# Patient Record
Sex: Male | Born: 1940 | Race: White | Hispanic: No | Marital: Married | Smoking: Former smoker
Health system: Southern US, Community
[De-identification: ages and names within clinical notes are randomized; demographics above are authoritative.]

## PROBLEM LIST (undated history)

## (undated) DIAGNOSIS — I2510 Atherosclerotic heart disease of native coronary artery without angina pectoris: Secondary | ICD-10-CM

## (undated) DIAGNOSIS — M1990 Unspecified osteoarthritis, unspecified site: Secondary | ICD-10-CM

## (undated) DIAGNOSIS — E119 Type 2 diabetes mellitus without complications: Secondary | ICD-10-CM

## (undated) DIAGNOSIS — K219 Gastro-esophageal reflux disease without esophagitis: Secondary | ICD-10-CM

## (undated) DIAGNOSIS — C44300 Unspecified malignant neoplasm of skin of unspecified part of face: Secondary | ICD-10-CM

## (undated) DIAGNOSIS — N402 Nodular prostate without lower urinary tract symptoms: Secondary | ICD-10-CM

## (undated) DIAGNOSIS — R112 Nausea with vomiting, unspecified: Secondary | ICD-10-CM

## (undated) DIAGNOSIS — Z9889 Other specified postprocedural states: Secondary | ICD-10-CM

## (undated) DIAGNOSIS — IMO0002 Reserved for concepts with insufficient information to code with codable children: Secondary | ICD-10-CM

## (undated) DIAGNOSIS — E785 Hyperlipidemia, unspecified: Secondary | ICD-10-CM

## (undated) DIAGNOSIS — N419 Inflammatory disease of prostate, unspecified: Secondary | ICD-10-CM

## (undated) DIAGNOSIS — Z87442 Personal history of urinary calculi: Secondary | ICD-10-CM

## (undated) HISTORY — PX: JOINT REPLACEMENT: SHX530

## (undated) HISTORY — DX: Atherosclerotic heart disease of native coronary artery without angina pectoris: I25.10

## (undated) HISTORY — PX: CARDIAC CATHETERIZATION: SHX172

## (undated) HISTORY — PX: CHOLECYSTECTOMY: SHX55

## (undated) HISTORY — PX: HAND SURGERY: SHX662

## (undated) HISTORY — PX: ROTATOR CUFF REPAIR: SHX139

## (undated) HISTORY — PX: KIDNEY STONE SURGERY: SHX686

## (undated) HISTORY — PX: KNEE SURGERY: SHX244

## (undated) HISTORY — DX: Inflammatory disease of prostate, unspecified: N41.9

## (undated) HISTORY — DX: Unspecified osteoarthritis, unspecified site: M19.90

## (undated) HISTORY — PX: SKIN CANCER EXCISION: SHX779

---

## 2000-07-26 ENCOUNTER — Other Ambulatory Visit: Admission: RE | Admit: 2000-07-26 | Discharge: 2000-07-26 | Payer: Self-pay | Admitting: General Surgery

## 2003-03-29 ENCOUNTER — Emergency Department (HOSPITAL_COMMUNITY): Admission: EM | Admit: 2003-03-29 | Discharge: 2003-03-29 | Payer: Self-pay | Admitting: Emergency Medicine

## 2004-03-04 ENCOUNTER — Inpatient Hospital Stay (HOSPITAL_COMMUNITY): Admission: RE | Admit: 2004-03-04 | Discharge: 2004-03-08 | Payer: Self-pay | Admitting: Orthopedic Surgery

## 2004-07-21 ENCOUNTER — Emergency Department (HOSPITAL_COMMUNITY): Admission: EM | Admit: 2004-07-21 | Discharge: 2004-07-21 | Payer: Self-pay | Admitting: Emergency Medicine

## 2004-07-25 ENCOUNTER — Observation Stay (HOSPITAL_COMMUNITY): Admission: EM | Admit: 2004-07-25 | Discharge: 2004-07-26 | Payer: Self-pay | Admitting: Emergency Medicine

## 2004-07-29 ENCOUNTER — Ambulatory Visit (HOSPITAL_COMMUNITY): Admission: RE | Admit: 2004-07-29 | Discharge: 2004-07-29 | Payer: Self-pay | Admitting: Urology

## 2007-06-07 ENCOUNTER — Ambulatory Visit (HOSPITAL_COMMUNITY): Admission: RE | Admit: 2007-06-07 | Discharge: 2007-06-07 | Payer: Self-pay | Admitting: Urology

## 2008-02-09 ENCOUNTER — Ambulatory Visit (HOSPITAL_COMMUNITY): Admission: RE | Admit: 2008-02-09 | Discharge: 2008-02-09 | Payer: Self-pay | Admitting: Internal Medicine

## 2008-02-14 ENCOUNTER — Ambulatory Visit (HOSPITAL_COMMUNITY): Admission: RE | Admit: 2008-02-14 | Discharge: 2008-02-14 | Payer: Self-pay | Admitting: Internal Medicine

## 2009-10-01 ENCOUNTER — Ambulatory Visit (HOSPITAL_COMMUNITY): Admission: RE | Admit: 2009-10-01 | Discharge: 2009-10-01 | Payer: Self-pay | Admitting: Family Medicine

## 2009-10-16 ENCOUNTER — Ambulatory Visit (HOSPITAL_COMMUNITY): Admission: RE | Admit: 2009-10-16 | Discharge: 2009-10-16 | Payer: Self-pay | Admitting: Family Medicine

## 2009-11-24 ENCOUNTER — Ambulatory Visit (HOSPITAL_COMMUNITY): Admission: RE | Admit: 2009-11-24 | Discharge: 2009-11-24 | Payer: Self-pay | Admitting: General Surgery

## 2009-12-12 ENCOUNTER — Ambulatory Visit (HOSPITAL_COMMUNITY)
Admission: RE | Admit: 2009-12-12 | Discharge: 2009-12-13 | Payer: Self-pay | Source: Home / Self Care | Admitting: General Surgery

## 2009-12-12 ENCOUNTER — Encounter (INDEPENDENT_AMBULATORY_CARE_PROVIDER_SITE_OTHER): Payer: Self-pay | Admitting: General Surgery

## 2010-01-20 ENCOUNTER — Ambulatory Visit (HOSPITAL_COMMUNITY)
Admission: RE | Admit: 2010-01-20 | Discharge: 2010-01-20 | Payer: Self-pay | Source: Home / Self Care | Attending: Family Medicine | Admitting: Family Medicine

## 2010-02-22 ENCOUNTER — Encounter: Payer: Self-pay | Admitting: Family Medicine

## 2010-04-14 LAB — CBC
HCT: 41.3 % (ref 39.0–52.0)
HCT: 45.5 % (ref 39.0–52.0)
Hemoglobin: 14.1 g/dL (ref 13.0–17.0)
MCH: 30.8 pg (ref 26.0–34.0)
MCH: 30.9 pg (ref 26.0–34.0)
MCV: 90.3 fL (ref 78.0–100.0)
MCV: 91 fL (ref 78.0–100.0)
Platelets: 148 10*3/uL — ABNORMAL LOW (ref 150–400)
Platelets: 164 10*3/uL (ref 150–400)
RBC: 4.57 MIL/uL (ref 4.22–5.81)
RDW: 13 % (ref 11.5–15.5)
WBC: 11 10*3/uL — ABNORMAL HIGH (ref 4.0–10.5)

## 2010-04-14 LAB — DIFFERENTIAL
Eosinophils Absolute: 0 10*3/uL (ref 0.0–0.7)
Eosinophils Relative: 0 % (ref 0–5)
Lymphocytes Relative: 15 % (ref 12–46)
Lymphs Abs: 1.7 10*3/uL (ref 0.7–4.0)
Monocytes Absolute: 1.2 10*3/uL — ABNORMAL HIGH (ref 0.1–1.0)
Monocytes Relative: 11 % (ref 3–12)

## 2010-04-14 LAB — HEPATIC FUNCTION PANEL
ALT: 23 U/L (ref 0–53)
ALT: 52 U/L (ref 0–53)
Alkaline Phosphatase: 43 U/L (ref 39–117)
Bilirubin, Direct: 0.2 mg/dL (ref 0.0–0.3)
Bilirubin, Direct: 0.2 mg/dL (ref 0.0–0.3)
Indirect Bilirubin: 0.6 mg/dL (ref 0.3–0.9)
Indirect Bilirubin: 0.7 mg/dL (ref 0.3–0.9)
Total Bilirubin: 0.8 mg/dL (ref 0.3–1.2)
Total Bilirubin: 0.9 mg/dL (ref 0.3–1.2)

## 2010-04-14 LAB — BASIC METABOLIC PANEL
BUN: 10 mg/dL (ref 6–23)
CO2: 25 mEq/L (ref 19–32)
CO2: 26 mEq/L (ref 19–32)
Chloride: 105 mEq/L (ref 96–112)
Chloride: 105 mEq/L (ref 96–112)
Creatinine, Ser: 0.82 mg/dL (ref 0.4–1.5)
Creatinine, Ser: 0.9 mg/dL (ref 0.4–1.5)
GFR calc Af Amer: >60 mL/min (ref >60)
Potassium: 4.1 mEq/L (ref 3.5–5.1)

## 2010-04-14 LAB — MRSA CULTURE

## 2010-06-19 NOTE — H&P (Signed)
NAME:  Stephen Rowe, Stephen Rowe                ACCOUNT NO.:  [id number]   MEDICAL RECORD NO.:  [id number]         PATIENT TYPE:  PAMB   LOCATION:                                FACILITY:  [facility]   PHYSICIAN:  Ky Barban, M.D.DATE OF BIRTH:  05/13/1940   DATE OF ADMISSION:  07/29/2004  DATE OF DISCHARGE:  LH                                HISTORY & PHYSICAL   This patient is coming on Wednesday to have a stone-basket done.   HISTORY:  A 70 year old gentleman who was seen in the office on July 23, 2004, with a right ureteral colic.  He has since last Monday been having  pain almost every day.  Pain is real severe.  He went to the emergency room  on Tuesday.  CT showed there is a 3-4 mm distal right ureteral calculus with  hydronephrosis, small bilateral renal calculi, and small bilateral  __________.  I told him that he can wait and see if the stone comes out.  If  not then Wednesday, I can do a stone-basket.  Procedures for stone-basket,  risks, complications, use of double J stent, possibility of open surgery  were discussed in detail.  They understand why I am going to go ahead and  schedule.  I see that over the weekend he had another renal colic so he was  admitted overnight in the hospital.   PAST MEDICAL HISTORY:  He had a knee replacement in February 2006.   FAMILY HISTORY:  Father had kidney stone, no prostate cancer.   SOCIAL HISTORY:  He does not smoke or drink.   REVIEW OF SYSTEMS:  Unremarkable.   PHYSICAL EXAMINATION:  VITAL SIGNS:  Blood pressure 130/80, temperature is  normal.  CENTRAL NERVOUS SYSTEM:  No neurological deficit.  HEENT:  Negative.  NECK:  Negative  CHEST:  Symmetrical.  HEART:  Regular sinus rhythm, no murmurs.  ABDOMEN:  Soft, flat.  Liver, spleen, kidneys are not palpable.  EXTERNAL GENITALIA:  Circumcised.  Meatus adequate.  Testicles are normal.  RECTAL:  Normal sphincter tone, no rectal mass, prostate ___________, there  is a hard area on the  right lobe in the middle.   IMPRESSION:  1.  Right distal ureter calculus.  2.  Bilateral small renal calculi.  3.  Hard prostate, right lobe.  4.  Cholelithiasis.  I have discussed in detail all of these findings with      the patient and they understand.       MIJ/MEDQ  D:  07/27/2004  T:  07/27/2004  Job:  [id number]
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
## 2010-06-19 NOTE — Op Note (Signed)
Stephen Rowe, Stephen Rowe               ACCOUNT NO.:  [id number]   MEDICAL RECORD NO.:  [id number]          PATIENT TYPE:  INP   LOCATION:  E454                         FACILITY:  [facility]   PHYSICIAN:  Ollen Gross, M.D.    DATE OF BIRTH:  1940-08-18   DATE OF PROCEDURE:  03/04/2004  DATE OF DISCHARGE:                                 OPERATIVE REPORT   PREOPERATIVE DIAGNOSIS:  Osteoarthritis of the right knee.   POSTOPERATIVE DIAGNOSES:  Osteoarthritis of the right knee.   OPERATION/PROCEDURE:  Right total knee arthroplasty, computer navigated.   SURGEON:  Ollen Gross, M.D.   ASSISTANT:  Alexzandrew L. Perkins, P.A.-C.   ANESTHESIA:  Spinal.   ESTIMATED BLOOD LOSS:  Minimal.   DRAINS:  Hemovac x1.   COMPLICATIONS:  None.   TOURNIQUET TIME:  77 minutes at 300 mmHg.   CONDITION:  Stable to recovery.   BRIEF CLINICAL NOTE:  Mr. Lukasik is a 70 year old male with significant end-  stage arthritis of the right knee with intractable pain.  He presents now  for right total knee arthroplasty.   DESCRIPTION OF PROCEDURE:  After the successful administration of spinal  anesthetic, a tourniquet was placed on the right thigh and right lower  extremity prepped and draped in the usual sterile fashion. The extremity was  wrapped in Esmarch, knee flexed, tourniquet inflated to 300 mmHg.   Standard midline incision was made with a 10-blade through the subcutaneous  tissue to the level of the extensor mechanism.  A fresh blade was used to  make a medial parapatellar arthrotomy and the soft tissue of proximal media  tibia subperiosteally elevated to the joint line with the knife and into the  semimembranosus bursa with a Cobb elevator.  Soft tissue of the proximal  lateral tibia was also elevated with attention being paid to avoiding the  patellar tendon on the tibial tubercle.  Patella would not evert as he has a  very large lateral osteophyte.  I decided to do the patella first as we  removed the soft tissue overlying the undersurface of the quad and patella  tendons and then measured the thickness of the patella to be 27 mm.  A free-  hand resection is then taken to 15 mm.  A 41 template was placed which would  match the appropriate size.  We did not drill the lug holes until later in  the case.  Patella is then everted.  Knee was flexed 90 degrees and the ACL  and PCL removed.  We then placed the 4 mm Shanz pins, two in the tibia and  two in the femur for placement of the computerized arrays.  The anatomical  data is then inputted into the computer to generate the anatomic femoral and  tibial models.  The alignment perioperatively is approximately 6 degrees of  varus with 5 degrees of flexion contracture.  We then subsequently began the  bony cuts.   The tibia was then subluxed forward and the menisci are removed.  We placed  the tibial cutting guide with the computerized array.  We placed it  to  remove 10 mm of the nondeficient lateral side.  Tibia resection is then made  with an oscillating saw in neutral varus and valgus and in neutral slope.  The cut is made and is verified to be as planned.  We placed the computer  verifier on there and it demonstrated the neutral cut with respect to slow,  then respective varus and valgus.  We then placed the optimizer tensioner in  full extension to gain equal flexion and extension gaps at a tension of B.  We used to B tension again in full flexion to gain the gaps.  The computer  sized the femur as a size 4 and set the rotation at approximately 5 degrees  external rotation.  This did correspond with the epicondylar axis.  We then  placed the distal femoral cutting block on the distal femur and the plan was  to resect approximately 11 mm of the femur.  Resection is made with an  oscillating saw in neutral varus and valgus and the appropriate amount of  flexion.  We confirmed this with the verification device.  The rotational   block is then placed and we put the pins at the appropriate level to give [place]  the external rotation that was marked by the computer.  We then placed the  size 4 AP cutting block and made the anterior, posterior and chamfer cuts  for the size 4 and subsequently verified that cut.  The cut was made as  planned.  The intercondylar block is placed and intercondylar cut made for  the femur.  The size 4 femoral trial was placed with excellent fit.  Tibia  was then subluxed forward again and the size 4 tibial tray is placed.  We  then prepared the proximal tibia with the modular drill and keel punch and  left the trial in place.  The 10-degree posterior stabilized rotating  platform insert was placed and full extension was achieved with excellent  varus and valgus balance throughout full range of motion. We confirmed full  extension with the computer and confirmed to overall alignment to be at 0  degrees with respect to varus and valgus.  The patella preparation was  completed using a 41 template.  The lug holes were drilled and the trial  patella was placed. The patella tracks normally.  Trials were removed and  then the osteophytes were removed off the posterior femur.  Femoral trials  were placed.  That trial was then removed and the cut bone surfaces are  prepared with pulsatile lavage.  Cement is mixed and once ready for  implantation, the size 4 mobile bearing tibial tray, size 4 posterior  stabilized femur and 41 patella are cemented into place and patella held  with a clamp.  All extruding cement is removed and once the cement is fully  hardened and the permanent 10 mm posterior stabilized rotating platform  insert is placed into the tibial tray.  The would was then copiously  irrigated with saline solution and extensor mechanism closed over a Hemovac  drain with interrupted #1 PDS.  The tourniquet released after a total time of 77 minutes.  Flexion against gravity is 135 degrees.   Subcutaneous  tissues were then closed with interrupted 2-0 Vicryl, subcuticular running 4-  0 Monocryl.  Incision was cleaned and dried and Steri-Strips and a bulky  sterile dressing applied.  The patient was then awakened and transported to  recovery in stable condition.  FA/MEDQ  D:  03/04/2004  T:  03/04/2004  Job:  [id number]
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
## 2010-07-07 ENCOUNTER — Ambulatory Visit (HOSPITAL_COMMUNITY)
Admission: RE | Admit: 2010-07-07 | Discharge: 2010-07-07 | Disposition: A | Discharge status: Home / Self Care | Payer: PRIVATE HEALTH INSURANCE | Source: Ambulatory Visit | Attending: Specialist | Admitting: Specialist

## 2010-07-07 DIAGNOSIS — M6281 Muscle weakness (generalized): Secondary | ICD-10-CM | POA: Insufficient documentation

## 2010-07-07 DIAGNOSIS — M25619 Stiffness of unspecified shoulder, not elsewhere classified: Secondary | ICD-10-CM | POA: Insufficient documentation

## 2010-07-07 DIAGNOSIS — M25519 Pain in unspecified shoulder: Secondary | ICD-10-CM | POA: Insufficient documentation

## 2010-07-07 DIAGNOSIS — IMO0001 Reserved for inherently not codable concepts without codable children: Secondary | ICD-10-CM | POA: Insufficient documentation

## 2010-07-08 ENCOUNTER — Ambulatory Visit (HOSPITAL_COMMUNITY)
Admission: RE | Admit: 2010-07-08 | Discharge: 2010-07-08 | Disposition: A | Discharge status: Home / Self Care | Payer: PRIVATE HEALTH INSURANCE | Source: Ambulatory Visit | Attending: *Deleted | Admitting: *Deleted

## 2010-07-09 ENCOUNTER — Ambulatory Visit (HOSPITAL_COMMUNITY)
Admission: RE | Admit: 2010-07-09 | Discharge: 2010-07-09 | Disposition: A | Discharge status: Home / Self Care | Payer: PRIVATE HEALTH INSURANCE | Source: Ambulatory Visit | Attending: *Deleted | Admitting: *Deleted

## 2010-07-13 ENCOUNTER — Ambulatory Visit (HOSPITAL_COMMUNITY): Payer: PRIVATE HEALTH INSURANCE | Admitting: Specialist

## 2010-07-14 ENCOUNTER — Ambulatory Visit (HOSPITAL_COMMUNITY)
Admission: RE | Admit: 2010-07-14 | Discharge: 2010-07-14 | Disposition: A | Discharge status: Home / Self Care | Payer: PRIVATE HEALTH INSURANCE | Source: Ambulatory Visit | Attending: Specialist | Admitting: Specialist

## 2010-07-16 ENCOUNTER — Ambulatory Visit (HOSPITAL_COMMUNITY)
Admission: RE | Admit: 2010-07-16 | Discharge: 2010-07-16 | Disposition: A | Discharge status: Home / Self Care | Payer: PRIVATE HEALTH INSURANCE | Source: Ambulatory Visit | Attending: *Deleted | Admitting: *Deleted

## 2010-07-21 ENCOUNTER — Ambulatory Visit (HOSPITAL_COMMUNITY)
Admission: RE | Admit: 2010-07-21 | Discharge: 2010-07-21 | Disposition: A | Discharge status: Home / Self Care | Payer: PRIVATE HEALTH INSURANCE | Source: Ambulatory Visit | Attending: *Deleted | Admitting: *Deleted

## 2010-07-22 ENCOUNTER — Ambulatory Visit (HOSPITAL_COMMUNITY)
Admission: RE | Admit: 2010-07-22 | Discharge: 2010-07-22 | Disposition: A | Discharge status: Home / Self Care | Payer: PRIVATE HEALTH INSURANCE | Source: Ambulatory Visit | Attending: *Deleted | Admitting: *Deleted

## 2010-07-23 ENCOUNTER — Ambulatory Visit (HOSPITAL_COMMUNITY)
Admission: RE | Admit: 2010-07-23 | Discharge: 2010-07-23 | Disposition: A | Discharge status: Home / Self Care | Payer: PRIVATE HEALTH INSURANCE | Source: Ambulatory Visit | Attending: *Deleted | Admitting: *Deleted

## 2010-07-27 ENCOUNTER — Ambulatory Visit (HOSPITAL_COMMUNITY)
Admission: RE | Admit: 2010-07-27 | Discharge: 2010-07-27 | Disposition: A | Discharge status: Home / Self Care | Payer: PRIVATE HEALTH INSURANCE | Source: Ambulatory Visit | Attending: *Deleted | Admitting: *Deleted

## 2010-07-29 ENCOUNTER — Ambulatory Visit (HOSPITAL_COMMUNITY)
Admission: RE | Admit: 2010-07-29 | Discharge: 2010-07-29 | Disposition: A | Discharge status: Home / Self Care | Payer: PRIVATE HEALTH INSURANCE | Source: Ambulatory Visit | Attending: *Deleted | Admitting: *Deleted

## 2010-07-31 ENCOUNTER — Ambulatory Visit (HOSPITAL_COMMUNITY)
Admission: RE | Admit: 2010-07-31 | Discharge: 2010-07-31 | Disposition: A | Discharge status: Home / Self Care | Payer: PRIVATE HEALTH INSURANCE | Source: Ambulatory Visit | Attending: *Deleted | Admitting: *Deleted

## 2010-08-03 ENCOUNTER — Ambulatory Visit (HOSPITAL_COMMUNITY)
Admission: RE | Admit: 2010-08-03 | Discharge: 2010-08-03 | Disposition: A | Discharge status: Home / Self Care | Payer: PRIVATE HEALTH INSURANCE | Source: Ambulatory Visit | Attending: Specialist | Admitting: Specialist

## 2010-08-03 DIAGNOSIS — M25619 Stiffness of unspecified shoulder, not elsewhere classified: Secondary | ICD-10-CM | POA: Insufficient documentation

## 2010-08-03 DIAGNOSIS — M6281 Muscle weakness (generalized): Secondary | ICD-10-CM | POA: Insufficient documentation

## 2010-08-03 DIAGNOSIS — M25519 Pain in unspecified shoulder: Secondary | ICD-10-CM | POA: Insufficient documentation

## 2010-08-03 DIAGNOSIS — IMO0001 Reserved for inherently not codable concepts without codable children: Secondary | ICD-10-CM | POA: Insufficient documentation

## 2010-08-06 ENCOUNTER — Ambulatory Visit (HOSPITAL_COMMUNITY)
Admission: RE | Admit: 2010-08-06 | Discharge: 2010-08-06 | Disposition: A | Discharge status: Home / Self Care | Payer: PRIVATE HEALTH INSURANCE | Source: Ambulatory Visit | Attending: *Deleted | Admitting: *Deleted

## 2010-08-07 ENCOUNTER — Ambulatory Visit (HOSPITAL_COMMUNITY): Payer: PRIVATE HEALTH INSURANCE | Admitting: Specialist

## 2010-08-10 ENCOUNTER — Ambulatory Visit (HOSPITAL_COMMUNITY)
Admission: RE | Admit: 2010-08-10 | Discharge: 2010-08-10 | Disposition: A | Discharge status: Home / Self Care | Payer: PRIVATE HEALTH INSURANCE | Source: Ambulatory Visit | Attending: *Deleted | Admitting: *Deleted

## 2010-08-10 NOTE — Progress Notes (Signed)
Occupational Therapy Treatment  Patient Name: Stephen Rowe MRN: [id number] Today's Date: 08/10/2010  HPI: Symptoms/Limitations Symptoms: increased pain Limitations: decreased mobility and strength Special Tests: n/a Pain Assessment Currently in Pain?: Yes Pain Score:   1 Pain Location: Shoulder Pain Orientation: Right Pain Type: Acute pain Pain Radiating Towards: n/a Pain Onset: In the past 7 days Pain Frequency: Intermittent Pain Relieving Factors: ice Effect of Pain on Daily Activities: just sore  Precautions/Restrictions    Mobility       Exercise/Treatments @FLOW [phone]  Goals Long Term Goals Long Term Goal 1: Patient will decrease pain in his right shoulder to 0/10. Long Term Goal 1 Progress: Progressing toward goal Long Term Goal 2: Increase right shoulder AROM to  [facility] for increased independence reaching into cabinets. Long Term Goal 2 Progress: Progressing toward goal Long Term Goal 3: Patient will return to prior level of independence with all daily and leisure activities. Long Term Goal 3 Progress: Progressing toward goal Long Term Goal 4: Patient will decrease fascial restrictions to trace in his right shoulder. Long Term Goal 4 Progress: Progressing toward goal Long Term Goal 5: Patient will increase right shoulder strength to 5/5 for increased independence with yardwork. Long Term Goal 5 Progress: Progressing toward goal End of Session There is no problem list on file for this patient.  End of Session Activity Tolerance: Patient tolerated treatment well General Behavior During Session: [facility] for tasks performed Cognition: [facility]  for tasks performed OT Assessment and Plan Clinical Impression Statement: added cybex exercises, increased to 2 pounds with PRE, added right left with ball exercise. Prognosis: Good OT Frequency: Min 3X/week OT Duration: Other (comment) (12 weeks) OT Treatment/Interventions: Therapeutic exercise;Manual therapy OT Plan: increase reps with strengthening exercises and increase time on wall wash   Stephen Rowe L 08/10/2010, 10:06 AM

## 2010-08-12 ENCOUNTER — Ambulatory Visit (HOSPITAL_COMMUNITY)
Admission: RE | Admit: 2010-08-12 | Discharge: 2010-08-12 | Disposition: A | Discharge status: Home / Self Care | Payer: PRIVATE HEALTH INSURANCE | Source: Ambulatory Visit | Attending: *Deleted | Admitting: *Deleted

## 2010-08-12 DIAGNOSIS — M25519 Pain in unspecified shoulder: Secondary | ICD-10-CM | POA: Insufficient documentation

## 2010-08-12 DIAGNOSIS — M75120 Complete rotator cuff tear or rupture of unspecified shoulder, not specified as traumatic: Secondary | ICD-10-CM | POA: Insufficient documentation

## 2010-08-12 DIAGNOSIS — M6281 Muscle weakness (generalized): Secondary | ICD-10-CM | POA: Insufficient documentation

## 2010-08-12 NOTE — Progress Notes (Signed)
Occupational Therapy Treatment  Patient Name: Stephen Rowe MRN: [id number] Today's Date: 08/12/2010  HPI: Symptoms/Limitations Symptoms:  little discomfort but not hurting Limitations: unable to sleep on that side causes increased pain Pain Assessment Currently in Pain?: No/denies Pain Score: 0-No pain  Precautions/Restrictions    Mobility       Exercise/Treatments @FLOW [phone]  Goals   End of Session Patient Active Problem List  Diagnoses  . Pain in joint, shoulder region  . Complete rupture of rotator cuff  . Muscle weakness (generalized)   End of Session Activity Tolerance: Patient tolerated treatment well OT Assessment and Plan Clinical Impression Statement: increased reps with weighted and  band exercises and increased plates with cybex Prognosis: Good OT Plan: add t-band to HEP   Katrina Brosh L 08/12/2010, 10:06 AM

## 2010-08-13 ENCOUNTER — Ambulatory Visit (HOSPITAL_COMMUNITY)
Admission: RE | Admit: 2010-08-13 | Discharge: 2010-08-13 | Disposition: A | Discharge status: Home / Self Care | Payer: PRIVATE HEALTH INSURANCE | Source: Ambulatory Visit | Attending: Family Medicine | Admitting: Family Medicine

## 2010-08-13 NOTE — Progress Notes (Deleted)
Occupational Therapy Treatment  Patient Name: Stephen Rowe MRN: [id number] Today's Date: 08/13/2010      Time in:  8:37  Time out:  9:40   HPI: Symptoms/Limitations Symptoms: It hurts durning the night.  I had to get up and put ice one it. Pain Assessment Currently in Pain?: Yes Pain Score:   1 Pain Location: Shoulder Pain Orientation: Right Pain Type: Surgical pain Pain Frequency: Intermittent  Precautions/Restrictions    Mobility       Exercise/Treatments Cervical Exercises Shoulder Flexion: PROM;Supine;Strengthening;15 reps (2 # supine and seated) Therapy Ball (Shoulder Flexion): 25 Theraband Level (Shoulder Extension): Level 3 (Green) (12 reps) Shoulder ABduction: PROM;Right;Supine;Strengthening;15 reps (2# supine and seated) Therapy Ball (Shoulder Abduction): 25 right and left 10 reps each Shoulder Horizontal ABduction: PROM;Right;Supine;Strengthening;15 reps (2# supine and seated) Theraband Level (Shoulder Retraction): Level 3 (Green) (12 reps) Theraband Level (Row): Level 3 (Green) (12 reps) Shoulder Internal Rotation: PROM;Right;Supine;Strengthening;15 reps (2# supine and seated) Theraband Level (Shoulder Internal Rotation): Level 3 (Green) (12 reps) Shoulder External Rotation: PROM;Right;Supine;Strengthening;15 reps (2# supine and seated) Theraband Level (Shoulder External Rotation): Level 3 (Green) (12 reps) UBE (Upper Arm Bike): 3' forward 3' reverse 2.0 resistance Shoulder Exercises Shoulder Flexion: PROM;Supine;Strengthening;15 reps (2 # supine and seated) Therapy Ball (Shoulder Flexion): 25 Theraband Level (Shoulder Extension): Level 3 (Green) (12 reps) Theraband Level (Shoulder Retraction): Level 3 (Green) (12 reps) Shoulder Protraction: Strengthening;Right;15 reps;Supine;Seated (2# supine and seated) Shoulder Horizontal ABduction: PROM;Right;Supine;Strengthening;15 reps (2# supine and seated) Shoulder ABduction: PROM;Right;Supine;Strengthening;15 reps (2#  supine and seated) Therapy Ball (Shoulder Abduction): 25 right and left 10 reps each Shoulder External Rotation: PROM;Right;Supine;Strengthening;15 reps (2# supine and seated) Theraband Level (Shoulder External Rotation): Level 3 (Green) (12 reps) Shoulder Internal Rotation: PROM;Right;Supine;Strengthening;15 reps (2# supine and seated) Theraband Level (Shoulder Internal Rotation): Level 3 (Green) (12 reps) Theraband Level (Row): Level 3 (Green) (12 reps) Additional ROM/Strengthening Exercises UBE (Upper Arm Bike): 3' forward 3' reverse 2.0 resistance Cybex Press: 2 plates x 15 reps Cybex Row: 2 plates x 15 reps Wall Wash: 4 minutes with 1 pound Thumb Tacks: 2 minutes Elevation/Depression - Shoulder Pro/Retraction: 2 minutes Additional Elbow Exercises UBE (Upper Arm Bike): 3' forward 3' reverse 2.0 resistance Cybex Press: 2 plates x 15 reps Cybex Row: 2 plates x 15 reps Neurological Re-education Exercises Shoulder Flexion: PROM;Supine;Strengthening;15 reps (2 # supine and seated) Therapy Ball (Shoulder Flexion): 25 Shoulder ABduction: PROM;Right;Supine;Strengthening;15 reps (2# supine and seated) Therapy Ball (Shoulder Abduction): 25 right and left 10 reps each Shoulder Protraction: Strengthening;Right;15 reps;Supine;Seated (2# supine and seated) Shoulder Horizontal ABduction: PROM;Right;Supine;Strengthening;15 reps (2# supine and seated) Shoulder External Rotation: PROM;Right;Supine;Strengthening;15 reps (2# supine and seated) Theraband Level (Shoulder External Rotation): Level 3 (Green) (12 reps) Shoulder Internal Rotation: PROM;Right;Supine;Strengthening;15 reps (2# supine and seated) Theraband Level (Shoulder Internal Rotation): Level 3 (Green) (12 reps) Work Hardening Exercises UBE (Upper Arm Bike): 3' forward 3' reverse 2.0 resistance Manual Therapy Manual Therapy: Myofascial release Myofascial Release: 8:37 to 8:54   Goals Long Term Goals Long Term Goal 1 Progress:  Progressing toward goal Long Term Goal 2 Progress: Met Long Term Goal 3 Progress: Progressing toward goal Long Term Goal 4 Progress: Partly met Long Term Goal 5 Progress: Progressing toward goal End of Session Patient Active Problem List  Diagnoses  . Pain in joint, shoulder region  . Complete rupture of rotator cuff  . Muscle weakness (generalized)       Noralee Stain, Kiearra Oyervides L 08/13/2010, 9:56 AM  Visit 15 of 36

## 2010-08-13 NOTE — Patient Instructions (Signed)
Patient instructed on t-ban for HEP.  Pt given handout for retraction, extension, row and IR/ER with green band.

## 2010-08-13 NOTE — Progress Notes (Signed)
Occupational Therapy Treatment  Patient Name: TELVIN REINDERS MRN: [id number] Today's Date: 08/13/2010     Time in:  8:37  Time out:  9:40  HPI: Symptoms/Limitations Symptoms: It hurts durning the night.  I had to get up and put ice one it. Pain Assessment Currently in Pain?: Yes Pain Score:   1 Pain Location: Shoulder Pain Orientation: Right Pain Type: Surgical pain Pain Frequency: Intermittent  Precautions/Restrictions    Mobility       Exercise/Treatments Cervical Exercises Shoulder Flexion: PROM;Supine;Strengthening;15 reps (2 # supine and seated) Therapy Ball (Shoulder Flexion): 25 Theraband Level (Shoulder Extension): Level 3 (Green) (12 reps) Shoulder ABduction: PROM;Right;Supine;Strengthening;15 reps (2# supine and seated) Therapy Ball (Shoulder Abduction): 25 right and left 10 reps each Shoulder Horizontal ABduction: PROM;Right;Supine;Strengthening;15 reps (2# supine and seated) Theraband Level (Shoulder Retraction): Level 3 (Green) (12 reps) Theraband Level (Row): Level 3 (Green) (12 reps) Shoulder Internal Rotation: PROM;Right;Supine;Strengthening;15 reps (2# supine and seated) Theraband Level (Shoulder Internal Rotation): Level 3 (Green) (12 reps) Shoulder External Rotation: PROM;Right;Supine;Strengthening;15 reps (2# supine and seated) Theraband Level (Shoulder External Rotation): Level 3 (Green) (12 reps) UBE (Upper Arm Bike): 3' forward 3' reverse 2.0 resistance Shoulder Exercises Shoulder Flexion: PROM;Supine;Strengthening;15 reps (2 # supine and seated) Therapy Ball (Shoulder Flexion): 25 Theraband Level (Shoulder Extension): Level 3 (Green) (12 reps) Theraband Level (Shoulder Retraction): Level 3 (Green) (12 reps) Shoulder Protraction: Strengthening;Right;15 reps;Supine;Seated (2# supine and seated) Shoulder Horizontal ABduction: PROM;Right;Supine;Strengthening;15 reps (2# supine and seated) Shoulder ABduction: PROM;Right;Supine;Strengthening;15 reps (2#  supine and seated) Therapy Ball (Shoulder Abduction): 25 right and left 10 reps each Shoulder External Rotation: PROM;Right;Supine;Strengthening;15 reps (2# supine and seated) Theraband Level (Shoulder External Rotation): Level 3 (Green) (12 reps) Shoulder Internal Rotation: PROM;Right;Supine;Strengthening;15 reps (2# supine and seated) Theraband Level (Shoulder Internal Rotation): Level 3 (Green) (12 reps) Theraband Level (Row): Level 3 (Green) (12 reps) Additional ROM/Strengthening Exercises UBE (Upper Arm Bike): 3' forward 3' reverse 2.0 resistance Cybex Press: 2 plates x 15 reps Cybex Row: 2 plates x 15 reps Wall Wash: 4 minutes with 1 pound Thumb Tacks: 2 minutes Elevation/Depression - Shoulder Pro/Retraction: 2 minutes Additional Elbow Exercises UBE (Upper Arm Bike): 3' forward 3' reverse 2.0 resistance Cybex Press: 2 plates x 15 reps Cybex Row: 2 plates x 15 reps Neurological Re-education Exercises Shoulder Flexion: PROM;Supine;Strengthening;15 reps (2 # supine and seated) Therapy Ball (Shoulder Flexion): 25 Shoulder ABduction: PROM;Right;Supine;Strengthening;15 reps (2# supine and seated) Therapy Ball (Shoulder Abduction): 25 right and left 10 reps each Shoulder Protraction: Strengthening;Right;15 reps;Supine;Seated (2# supine and seated) Shoulder Horizontal ABduction: PROM;Right;Supine;Strengthening;15 reps (2# supine and seated) Shoulder External Rotation: PROM;Right;Supine;Strengthening;15 reps (2# supine and seated) Theraband Level (Shoulder External Rotation): Level 3 (Green) (12 reps) Shoulder Internal Rotation: PROM;Right;Supine;Strengthening;15 reps (2# supine and seated) Theraband Level (Shoulder Internal Rotation): Level 3 (Green) (12 reps) Work Hardening Exercises UBE (Upper Arm Bike): 3' forward 3' reverse 2.0 resistance Manual Therapy Manual Therapy: Myofascial release Myofascial Release: 8:37 to 8:54   Goals Long Term Goals Long Term Goal 1 Progress:  Progressing toward goal Long Term Goal 2 Progress: Met Long Term Goal 3 Progress: Progressing toward goal Long Term Goal 4 Progress: Partly met Long Term Goal 5 Progress: Progressing toward goal End of Session Patient Active Problem List  Diagnoses  . Pain in joint, shoulder region  . Complete rupture of rotator cuff  . Muscle weakness (generalized)   OT Assessment and Plan Clinical Impression Statement: added tband to hep Prognosis: Good OT Plan: d/c band   Noralee Stain, Ilse Billman  L 08/13/2010, 9:59 AM  Visit 16 of 36

## 2011-08-26 ENCOUNTER — Ambulatory Visit (INDEPENDENT_AMBULATORY_CARE_PROVIDER_SITE_OTHER): Payer: PRIVATE HEALTH INSURANCE | Admitting: Otolaryngology

## 2011-08-26 DIAGNOSIS — H902 Conductive hearing loss, unspecified: Secondary | ICD-10-CM

## 2011-08-26 DIAGNOSIS — H6120 Impacted cerumen, unspecified ear: Secondary | ICD-10-CM

## 2011-10-19 ENCOUNTER — Other Ambulatory Visit (HOSPITAL_COMMUNITY): Payer: Self-pay | Admitting: Internal Medicine

## 2011-10-19 DIAGNOSIS — Z0000 Encounter for general adult medical examination without abnormal findings: Secondary | ICD-10-CM

## 2011-10-25 ENCOUNTER — Ambulatory Visit (HOSPITAL_COMMUNITY)
Admission: RE | Admit: 2011-10-25 | Discharge: 2011-10-25 | Disposition: A | Discharge status: Home / Self Care | Payer: Medicare Other | Source: Ambulatory Visit | Attending: Internal Medicine | Admitting: Internal Medicine

## 2011-10-25 ENCOUNTER — Other Ambulatory Visit (HOSPITAL_COMMUNITY): Payer: Self-pay | Admitting: Internal Medicine

## 2011-10-25 DIAGNOSIS — I2510 Atherosclerotic heart disease of native coronary artery without angina pectoris: Secondary | ICD-10-CM | POA: Insufficient documentation

## 2011-10-25 DIAGNOSIS — Z0000 Encounter for general adult medical examination without abnormal findings: Secondary | ICD-10-CM

## 2011-10-25 DIAGNOSIS — I779 Disorder of arteries and arterioles, unspecified: Secondary | ICD-10-CM | POA: Insufficient documentation

## 2011-10-26 ENCOUNTER — Ambulatory Visit (INDEPENDENT_AMBULATORY_CARE_PROVIDER_SITE_OTHER): Payer: Medicare Other | Admitting: Gastroenterology

## 2011-10-26 ENCOUNTER — Encounter: Payer: Self-pay | Admitting: Gastroenterology

## 2011-10-26 VITALS — BP 131/74 | HR 88 | Temp 98.2°F | Ht 70.0 in | Wt 203.8 lb

## 2011-10-26 DIAGNOSIS — K219 Gastro-esophageal reflux disease without esophagitis: Secondary | ICD-10-CM

## 2011-10-26 DIAGNOSIS — Z1211 Encounter for screening for malignant neoplasm of colon: Secondary | ICD-10-CM

## 2011-10-26 DIAGNOSIS — R1031 Right lower quadrant pain: Secondary | ICD-10-CM

## 2011-10-26 DIAGNOSIS — IMO0001 Reserved for inherently not codable concepts without codable children: Secondary | ICD-10-CM | POA: Insufficient documentation

## 2011-10-26 MED ORDER — PEG 3350-KCL-NA BICARB-NACL 420 G PO SOLR: 4000.0000 mL | ORAL | Status: DC

## 2011-10-26 NOTE — Assessment & Plan Note (Signed)
71 year old male with need for initial screening colonoscopy. No rectal bleeding, rare constipation related to dietary choices. Notes RLQ "awareness", but no pain. No abnormalities appreciated on physical exam. See "RLQ discomfort".   Proceed with TCS with Dr. Jena Gauss in near future: the risks, benefits, and alternatives have been discussed with the patient in detail. The patient states understanding and desires to proceed.

## 2011-10-26 NOTE — Progress Notes (Signed)
Faxed to PCP

## 2011-10-26 NOTE — Assessment & Plan Note (Signed)
Rare, related to dietary choices. No PPI currently. Notes extremely rare episodes of difficulty swallowing chicken and meat if not chewing well enough. Instructed to chew well, monitor for any continued or worsening of symptoms. No need for upper endoscopy at this point but would offer in future if no improvement with behavior modification.

## 2011-10-26 NOTE — Progress Notes (Signed)
Primary Care Physician:  Cassell Smiles., MD Primary Gastroenterologist:  Dr. Jena Gauss   Chief Complaint  Patient presents with  . Colonoscopy    HPI:   71 year old male who presents today for visit prior to initial screening colonoscopy. States he is somewhat overdue, as he was scheduled to have one in the past but developed kidney stones. Notes very seldom constipation. Ate junk food over the weekend in [place] at the [employer]. Otherwise no change in bowel habits. Denies rectal bleeding. Notes RLQ "awareness" since cholecystectomy in Oct 2012. NO PAIN. States his urologist checked him for inguinal hernia. Pulls knee up and "feels like there is something in there".  Unaware of any straining, strenuous activity that would cause it. Noticing increasing since early spring.   If eats wrong food, "a little acid" and will take an antacid tablet. Immediate relief. Sweets aggravate. Also states chicken and meat will sometimes cause an issue if he doesn't chew well or eats too fast. States extremely rare.   Past Medical History  Diagnosis Date  . CAD (coronary artery disease)     3 blockages, unchanged in past 15 years, s/p cardiac cath X 2 but no stents  . Arthritis   . Prostatitis     Past Surgical History  Procedure Date  . Kidney stone surgery   . Rotator cuff repair     right  . Cholecystectomy   . Knee surgery     right  . Hand surgery     right    Current Outpatient Prescriptions  Medication Sig Dispense Refill  . aspirin 81 MG tablet Take 81 mg by mouth daily.      . rosuvastatin (CRESTOR) 10 MG tablet Take 10 mg by mouth daily.        Allergies as of 10/26/2011  . (No Known Allergies)    Family History  Problem Relation Age of Onset  . Colon cancer Neg Hx     History   Social History  . Marital Status: Married    Spouse Name: N/A    Number of Children: N/A  . Years of Education: N/A   Occupational History  . retired     ran a Manufacturing engineer   Social History  Main Topics  . Smoking status: Former Smoker -- 0.5 packs/day    Types: Cigarettes  . Smokeless tobacco: Not on file   Comment: quit about 45 years ago  . Alcohol Use: No  . Drug Use: No  . Sexually Active: Not on file   Other Topics Concern  . Not on file   Social History Narrative  . No narrative on file    Review of Systems: Gen: Denies any fever, chills, fatigue, weight loss, lack of appetite.  CV: Denies chest pain, heart palpitations, peripheral edema, syncope.  Resp: Denies shortness of breath at rest or with exertion. Denies wheezing or cough.  GI: SEE HPI GU : Denies urinary burning, urinary frequency, urinary hesitancy MS: Denies joint pain, muscle weakness, cramps, or limitation of movement.  Derm: Denies rash, itching, dry skin Psych: Denies depression, anxiety, memory loss, and confusion Heme: Denies bruising, bleeding, and enlarged lymph nodes.  Physical Exam: BP 131/74  Pulse 88  Temp 98.2 F (36.8 C) (Temporal)  Ht 5\' 10"  (1.778 m)  Wt 203 lb 12.8 oz (92.443 kg)  BMI 29.24 kg/m2 General:   Alert and oriented. Pleasant and cooperative. Well-nourished and well-developed.  Head:  Normocephalic and atraumatic. Eyes:  Without icterus, sclera clear and  conjunctiva pink.  Ears:  Normal auditory acuity. Nose:  No deformity, discharge,  or lesions. Mouth:  No deformity or lesions, oral mucosa pink.  Neck:  Supple, without mass or thyromegaly. Lungs:  Clear to auscultation bilaterally. No wheezes, rales, or rhonchi. No distress.  Heart:  S1, S2 present without murmurs appreciated.  Abdomen:  +BS, soft, non-tender and non-distended. No HSM noted. No guarding or rebound. No masses appreciated.  Rectal:  Deferred  Msk:  Symmetrical without gross deformities. Normal posture. Extremities:  Without clubbing or edema. Neurologic:  Alert and  oriented x4;  grossly normal neurologically. Skin:  Intact without significant lesions or rashes. Cervical Nodes:  No  significant cervical adenopathy. Psych:  Alert and cooperative. Normal mood and affect.

## 2011-10-26 NOTE — Patient Instructions (Addendum)
We have set you up for a colonoscopy with Dr. Rourk in the near future.  Further recommendations to follow once this is completed.  

## 2011-10-26 NOTE — Assessment & Plan Note (Signed)
Notes "awareness" in RLQ with movement, exertion, and being still. No pain. No abnormalities on physical exam. Unclear etiology at this time. Consider CT scan if necessary after TCS. Last scan in Aug 2011.

## 2011-11-15 ENCOUNTER — Telehealth: Payer: Self-pay | Admitting: Internal Medicine

## 2011-11-15 ENCOUNTER — Encounter (HOSPITAL_COMMUNITY): Payer: Self-pay | Admitting: Pharmacy Technician

## 2011-11-15 NOTE — Telephone Encounter (Signed)
Please call patient back at [phone]. He has questions concerning his procedure

## 2011-11-15 NOTE — Telephone Encounter (Signed)
Done

## 2011-11-17 MED ORDER — SODIUM CHLORIDE 0.45 % IV SOLN
INTRAVENOUS | Status: DC
  Administered 2011-11-18: 08:00:00 via INTRAVENOUS

## 2011-11-18 ENCOUNTER — Ambulatory Visit (HOSPITAL_COMMUNITY)
Admission: RE | Admit: 2011-11-18 | Discharge: 2011-11-18 | Disposition: A | Discharge status: Home / Self Care | Payer: Medicare Other | Source: Ambulatory Visit | Attending: Internal Medicine | Admitting: Internal Medicine

## 2011-11-18 ENCOUNTER — Encounter (HOSPITAL_COMMUNITY)
Admission: RE | Disposition: A | Discharge status: Home / Self Care | Payer: Self-pay | Source: Ambulatory Visit | Attending: Internal Medicine

## 2011-11-18 ENCOUNTER — Encounter (HOSPITAL_COMMUNITY): Payer: Self-pay | Admitting: *Deleted

## 2011-11-18 DIAGNOSIS — Z1211 Encounter for screening for malignant neoplasm of colon: Secondary | ICD-10-CM

## 2011-11-18 DIAGNOSIS — K5730 Diverticulosis of large intestine without perforation or abscess without bleeding: Secondary | ICD-10-CM

## 2011-11-18 HISTORY — DX: Hyperlipidemia, unspecified: E78.5

## 2011-11-18 HISTORY — DX: Other specified postprocedural states: Z98.890

## 2011-11-18 HISTORY — DX: Unspecified malignant neoplasm of skin of unspecified part of face: C44.300

## 2011-11-18 HISTORY — PX: COLONOSCOPY: SHX5424

## 2011-11-18 HISTORY — DX: Other specified postprocedural states: R11.2

## 2011-11-18 HISTORY — DX: Personal history of urinary calculi: Z87.442

## 2011-11-18 HISTORY — DX: Gastro-esophageal reflux disease without esophagitis: K21.9

## 2011-11-18 SURGERY — COLONOSCOPY
Anesthesia: Moderate Sedation

## 2011-11-18 MED ORDER — MEPERIDINE HCL 100 MG/ML IJ SOLN
INTRAMUSCULAR | Status: DC | PRN
  Administered 2011-11-18: 25 mg via INTRAVENOUS
  Administered 2011-11-18: 50 mg via INTRAVENOUS

## 2011-11-18 MED ORDER — STERILE WATER FOR IRRIGATION IR SOLN
Status: DC | PRN
  Administered 2011-11-18: 09:00:00

## 2011-11-18 MED ORDER — ONDANSETRON HCL 4 MG/2ML IJ SOLN
INTRAMUSCULAR | Status: DC | PRN
  Administered 2011-11-18: 4 mg via INTRAVENOUS

## 2011-11-18 MED ORDER — MIDAZOLAM HCL 5 MG/5ML IJ SOLN
INTRAMUSCULAR | Status: DC | PRN
  Administered 2011-11-18: 1 mg via INTRAVENOUS
  Administered 2011-11-18 (×2): 2 mg via INTRAVENOUS

## 2011-11-18 MED ORDER — MIDAZOLAM HCL 5 MG/5ML IJ SOLN
INTRAMUSCULAR | Status: AC
  Filled 2011-11-18: qty 5

## 2011-11-18 MED ORDER — MIDAZOLAM HCL 5 MG/5ML IJ SOLN
INTRAMUSCULAR | Status: AC
  Filled 2011-11-18: qty 10

## 2011-11-18 MED ORDER — MEPERIDINE HCL 100 MG/ML IJ SOLN
INTRAMUSCULAR | Status: AC
  Filled 2011-11-18: qty 2

## 2011-11-18 NOTE — Interval H&P Note (Signed)
History and Physical Interval Note:  11/18/2011 8:42 AM  Stephen Rowe  has presented today for surgery, with the diagnosis of SCREENING  The various methods of treatment have been discussed with the patient and family. After consideration of risks, benefits and other options for treatment, the patient has consented to  Procedure(s) (LRB) with comments: COLONOSCOPY (N/A) - 8:45 as a surgical intervention .  The patient's history has been reviewed, patient examined, no change in status, stable for surgery.  I have reviewed the patient's chart and labs.  Questions were answered to the patient's satisfaction.     Eula Listen

## 2011-11-18 NOTE — H&P (View-Only) (Signed)
Primary Care Physician:  FUSCO,LAWRENCE J., MD Primary Gastroenterologist:  Dr. Rourk   Chief Complaint  Patient presents with  . Colonoscopy    HPI:   70-year-old male who presents today for visit prior to initial screening colonoscopy. States he is somewhat overdue, as he was scheduled to have one in the past but developed kidney stones. Notes very seldom constipation. Ate junk food over the weekend in [place] at the [employer]. Otherwise no change in bowel habits. Denies rectal bleeding. Notes RLQ "awareness" since cholecystectomy in Oct 2012. NO PAIN. States his urologist checked him for inguinal hernia. Pulls knee up and "feels like there is something in there".  Unaware of any straining, strenuous activity that would cause it. Noticing increasing since early spring.   If eats wrong food, "a little acid" and will take an antacid tablet. Immediate relief. Sweets aggravate. Also states chicken and meat will sometimes cause an issue if he doesn't chew well or eats too fast. States extremely rare.   Past Medical History  Diagnosis Date  . CAD (coronary artery disease)     3 blockages, unchanged in past 15 years, s/p cardiac cath X 2 but no stents  . Arthritis   . Prostatitis     Past Surgical History  Procedure Date  . Kidney stone surgery   . Rotator cuff repair     right  . Cholecystectomy   . Knee surgery     right  . Hand surgery     right    Current Outpatient Prescriptions  Medication Sig Dispense Refill  . aspirin 81 MG tablet Take 81 mg by mouth daily.      . rosuvastatin (CRESTOR) 10 MG tablet Take 10 mg by mouth daily.        Allergies as of 10/26/2011  . (No Known Allergies)    Family History  Problem Relation Age of Onset  . Colon cancer Neg Hx     History   Social History  . Marital Status: Married    Spouse Name: N/A    Number of Children: N/A  . Years of Education: N/A   Occupational History  . retired     ran a sign shop   Social History  Main Topics  . Smoking status: Former Smoker -- 0.5 packs/day    Types: Cigarettes  . Smokeless tobacco: Not on file   Comment: quit about 45 years ago  . Alcohol Use: No  . Drug Use: No  . Sexually Active: Not on file   Other Topics Concern  . Not on file   Social History Narrative  . No narrative on file    Review of Systems: Gen: Denies any fever, chills, fatigue, weight loss, lack of appetite.  CV: Denies chest pain, heart palpitations, peripheral edema, syncope.  Resp: Denies shortness of breath at rest or with exertion. Denies wheezing or cough.  GI: SEE HPI GU : Denies urinary burning, urinary frequency, urinary hesitancy MS: Denies joint pain, muscle weakness, cramps, or limitation of movement.  Derm: Denies rash, itching, dry skin Psych: Denies depression, anxiety, memory loss, and confusion Heme: Denies bruising, bleeding, and enlarged lymph nodes.  Physical Exam: BP 131/74  Pulse 88  Temp 98.2 F (36.8 C) (Temporal)  Ht 5' 10" (1.778 m)  Wt 203 lb 12.8 oz (92.443 kg)  BMI 29.24 kg/m2 General:   Alert and oriented. Pleasant and cooperative. Well-nourished and well-developed.  Head:  Normocephalic and atraumatic. Eyes:  Without icterus, sclera clear and   conjunctiva pink.  Ears:  Normal auditory acuity. Nose:  No deformity, discharge,  or lesions. Mouth:  No deformity or lesions, oral mucosa pink.  Neck:  Supple, without mass or thyromegaly. Lungs:  Clear to auscultation bilaterally. No wheezes, rales, or rhonchi. No distress.  Heart:  S1, S2 present without murmurs appreciated.  Abdomen:  +BS, soft, non-tender and non-distended. No HSM noted. No guarding or rebound. No masses appreciated.  Rectal:  Deferred  Msk:  Symmetrical without gross deformities. Normal posture. Extremities:  Without clubbing or edema. Neurologic:  Alert and  oriented x4;  grossly normal neurologically. Skin:  Intact without significant lesions or rashes. Cervical Nodes:  No  significant cervical adenopathy. Psych:  Alert and cooperative. Normal mood and affect.    

## 2011-11-18 NOTE — Op Note (Signed)
[facility] [address] [place], [address]   COLONOSCOPY PROCEDURE REPORT  PATIENT: Stephen, Rowe  MR#:         [id number] BIRTHDATE: 04-11-1940 , 70  yrs. old GENDER: Male ENDOSCOPIST: R.  Roetta Sessions, MD FACP FACG REFERRED BY:  Artis Delay, M.D. PROCEDURE DATE:  11/18/2011 PROCEDURE:     screening colonoscopy  INDICATIONS: first ever average risk screening examination  INFORMED CONSENT:  The risks, benefits, alternatives and imponderables including but not limited to bleeding, perforation as well as the possibility of a missed lesion have been reviewed.  The potential for biopsy, lesion removal, etc. have also been discussed.  Questions have been answered.  All parties agreeable. Please see the history and physical in the medical record for more information.  MEDICATIONS: Versed 5 mg IV and Demerol 75 mg IV in divided doses. Zofran 4 mg IV to prophylax against post operative nausea.  DESCRIPTION OF PROCEDURE:  After a digital rectal exam was performed, the Pentax Colonoscope [id number]  colonoscope was advanced from the anus through the rectum and colon to the area of the cecum, ileocecal valve and appendiceal orifice.  The cecum was deeply intubated.  These structures were well-seen and photographed for the record.  From the level of the cecum and ileocecal valve, the scope was slowly and cautiously withdrawn.  The mucosal surfaces were carefully surveyed utilizing scope tip deflection to facilitate fold flattening as needed.  The scope was pulled down into the rectum where a thorough examination including retroflexion was performed.    FINDINGS:  Adequate preparation. Normal rectum. Scattered left-sided diverticula; remainder of colonic mucosa appeared normal.  THERAPEUTIC / DIAGNOSTIC MANEUVERS PERFORMED:  none  COMPLICATIONS: none  CECAL WITHDRAWAL TIME:  7 minutes  IMPRESSION:  Normal rectum. Colonic diverticulosis  RECOMMENDATIONS:  Repeat screening colonoscopy in 10 years. CT of abdomen and pelvis with IV and oral contrast to further evaluate right lower quadrant abdominal pain.   _______________________________ eSigned:  R. Roetta Sessions, MD FACP [facility] 11/18/2011 9:28 AM   CC:

## 2011-11-25 ENCOUNTER — Encounter (HOSPITAL_COMMUNITY): Payer: Self-pay | Admitting: Internal Medicine

## 2012-09-20 ENCOUNTER — Ambulatory Visit
Admission: RE | Admit: 2012-09-20 | Discharge: 2012-09-20 | Disposition: A | Discharge status: Home / Self Care | Payer: Medicare Other | Source: Ambulatory Visit | Attending: Neurosurgery | Admitting: Neurosurgery

## 2012-09-20 ENCOUNTER — Other Ambulatory Visit: Payer: Self-pay | Admitting: Neurosurgery

## 2012-09-20 DIAGNOSIS — T7589XS Other specified effects of external causes, sequela: Secondary | ICD-10-CM

## 2012-09-20 DIAGNOSIS — M5126 Other intervertebral disc displacement, lumbar region: Secondary | ICD-10-CM

## 2012-10-12 ENCOUNTER — Other Ambulatory Visit: Payer: Self-pay | Admitting: Neurosurgery

## 2012-10-12 DIAGNOSIS — M4310 Spondylolisthesis, site unspecified: Secondary | ICD-10-CM

## 2012-10-23 ENCOUNTER — Ambulatory Visit
Admission: RE | Admit: 2012-10-23 | Discharge: 2012-10-23 | Disposition: A | Discharge status: Home / Self Care | Payer: Medicare Other | Source: Ambulatory Visit | Attending: Neurosurgery | Admitting: Neurosurgery

## 2012-10-23 VITALS — BP 122/69 | HR 63

## 2012-10-23 DIAGNOSIS — M4310 Spondylolisthesis, site unspecified: Secondary | ICD-10-CM

## 2012-10-23 MED ORDER — METHYLPREDNISOLONE ACETATE 40 MG/ML INJ SUSP (RADIOLOG
120.0000 mg | Freq: Once | INTRAMUSCULAR | Status: AC
  Administered 2012-10-23: 120 mg via EPIDURAL

## 2012-10-23 MED ORDER — IOHEXOL 180 MG/ML  SOLN
1.0000 mL | Freq: Once | INTRAMUSCULAR | Status: AC | PRN
  Administered 2012-10-23: 1 mL via EPIDURAL

## 2012-11-17 ENCOUNTER — Other Ambulatory Visit: Payer: Self-pay | Admitting: Neurosurgery

## 2012-11-17 DIAGNOSIS — M479 Spondylosis, unspecified: Secondary | ICD-10-CM

## 2012-11-27 ENCOUNTER — Ambulatory Visit
Admission: RE | Admit: 2012-11-27 | Discharge: 2012-11-27 | Disposition: A | Discharge status: Home / Self Care | Payer: Medicare Other | Source: Ambulatory Visit | Attending: Neurosurgery | Admitting: Neurosurgery

## 2012-11-27 VITALS — BP 127/74 | HR 71

## 2012-11-27 DIAGNOSIS — M479 Spondylosis, unspecified: Secondary | ICD-10-CM

## 2012-11-27 MED ORDER — IOHEXOL 180 MG/ML  SOLN
1.0000 mL | Freq: Once | INTRAMUSCULAR | Status: AC | PRN
  Administered 2012-11-27: 1 mL via EPIDURAL

## 2012-11-27 MED ORDER — METHYLPREDNISOLONE ACETATE 40 MG/ML INJ SUSP (RADIOLOG
120.0000 mg | Freq: Once | INTRAMUSCULAR | Status: AC
  Administered 2012-11-27: 120 mg via EPIDURAL

## 2013-05-25 ENCOUNTER — Other Ambulatory Visit (HOSPITAL_COMMUNITY): Payer: Self-pay | Admitting: Family Medicine

## 2013-05-25 DIAGNOSIS — I10 Essential (primary) hypertension: Secondary | ICD-10-CM

## 2013-05-25 DIAGNOSIS — Z139 Encounter for screening, unspecified: Secondary | ICD-10-CM

## 2013-05-25 DIAGNOSIS — I2510 Atherosclerotic heart disease of native coronary artery without angina pectoris: Secondary | ICD-10-CM

## 2013-05-29 ENCOUNTER — Ambulatory Visit (HOSPITAL_COMMUNITY)
Admission: RE | Admit: 2013-05-29 | Discharge: 2013-05-29 | Disposition: A | Discharge status: Home / Self Care | Payer: Medicare Other | Source: Ambulatory Visit | Attending: Family Medicine | Admitting: Family Medicine

## 2013-05-29 DIAGNOSIS — I2510 Atherosclerotic heart disease of native coronary artery without angina pectoris: Secondary | ICD-10-CM | POA: Insufficient documentation

## 2013-05-29 DIAGNOSIS — Z1389 Encounter for screening for other disorder: Secondary | ICD-10-CM | POA: Insufficient documentation

## 2013-05-29 DIAGNOSIS — I10 Essential (primary) hypertension: Secondary | ICD-10-CM | POA: Insufficient documentation

## 2013-05-29 DIAGNOSIS — Z139 Encounter for screening, unspecified: Secondary | ICD-10-CM

## 2013-07-02 DEATH — deceased

## 2013-07-23 ENCOUNTER — Encounter (HOSPITAL_COMMUNITY): Payer: Self-pay | Admitting: Emergency Medicine

## 2013-07-23 ENCOUNTER — Emergency Department (HOSPITAL_COMMUNITY): Payer: Medicare Other

## 2013-07-23 ENCOUNTER — Emergency Department (HOSPITAL_COMMUNITY)
Admission: EM | Admit: 2013-07-23 | Discharge: 2013-07-23 | Disposition: A | Discharge status: Home / Self Care | Payer: Medicare Other | Attending: Emergency Medicine | Admitting: Emergency Medicine

## 2013-07-23 DIAGNOSIS — I2510 Atherosclerotic heart disease of native coronary artery without angina pectoris: Secondary | ICD-10-CM | POA: Insufficient documentation

## 2013-07-23 DIAGNOSIS — Z7982 Long term (current) use of aspirin: Secondary | ICD-10-CM | POA: Insufficient documentation

## 2013-07-23 DIAGNOSIS — Z8719 Personal history of other diseases of the digestive system: Secondary | ICD-10-CM | POA: Insufficient documentation

## 2013-07-23 DIAGNOSIS — E785 Hyperlipidemia, unspecified: Secondary | ICD-10-CM | POA: Insufficient documentation

## 2013-07-23 DIAGNOSIS — M129 Arthropathy, unspecified: Secondary | ICD-10-CM | POA: Insufficient documentation

## 2013-07-23 DIAGNOSIS — Z791 Long term (current) use of non-steroidal anti-inflammatories (NSAID): Secondary | ICD-10-CM | POA: Insufficient documentation

## 2013-07-23 DIAGNOSIS — Z87442 Personal history of urinary calculi: Secondary | ICD-10-CM | POA: Insufficient documentation

## 2013-07-23 DIAGNOSIS — Z85828 Personal history of other malignant neoplasm of skin: Secondary | ICD-10-CM | POA: Insufficient documentation

## 2013-07-23 DIAGNOSIS — M549 Dorsalgia, unspecified: Secondary | ICD-10-CM

## 2013-07-23 DIAGNOSIS — G8929 Other chronic pain: Secondary | ICD-10-CM | POA: Insufficient documentation

## 2013-07-23 DIAGNOSIS — M4716 Other spondylosis with myelopathy, lumbar region: Secondary | ICD-10-CM | POA: Insufficient documentation

## 2013-07-23 DIAGNOSIS — Z87891 Personal history of nicotine dependence: Secondary | ICD-10-CM | POA: Insufficient documentation

## 2013-07-23 DIAGNOSIS — Z79899 Other long term (current) drug therapy: Secondary | ICD-10-CM | POA: Insufficient documentation

## 2013-07-23 HISTORY — DX: Reserved for concepts with insufficient information to code with codable children: IMO0002

## 2013-07-23 MED ORDER — OXYCODONE-ACETAMINOPHEN 5-325 MG PO TABS: 1.0000 | ORAL_TABLET | ORAL | Status: DC | PRN

## 2013-07-23 MED ORDER — DIAZEPAM 5 MG PO TABS
5.0000 mg | ORAL_TABLET | Freq: Once | ORAL | Status: AC
  Administered 2013-07-23: 5 mg via ORAL
  Filled 2013-07-23: qty 1

## 2013-07-23 MED ORDER — ONDANSETRON 4 MG PO TBDP
8.0000 mg | ORAL_TABLET | Freq: Once | ORAL | Status: AC
  Administered 2013-07-23: 8 mg via ORAL
  Filled 2013-07-23: qty 2

## 2013-07-23 MED ORDER — HYDROMORPHONE HCL PF 1 MG/ML IJ SOLN
1.5000 mg | Freq: Once | INTRAMUSCULAR | Status: AC
Start: 2013-07-23 — End: 2013-07-23
  Administered 2013-07-23: 1.5 mg via INTRAVENOUS
  Filled 2013-07-23: qty 2

## 2013-07-23 MED ORDER — HYDROMORPHONE HCL PF 1 MG/ML IJ SOLN
1.0000 mg | INTRAMUSCULAR | Status: AC
  Administered 2013-07-23: 1 mg via INTRAVENOUS
  Filled 2013-07-23: qty 1

## 2013-07-23 MED ORDER — DIAZEPAM 5 MG/ML IJ SOLN
10.0000 mg | Freq: Once | INTRAMUSCULAR | Status: AC
  Administered 2013-07-23: 10 mg via INTRAVENOUS
  Filled 2013-07-23: qty 2

## 2013-07-23 MED ORDER — DIAZEPAM 10 MG PO TABS: 10.0000 mg | ORAL_TABLET | Freq: Four times a day (QID) | ORAL | Status: DC | PRN

## 2013-07-23 MED ORDER — OXYCODONE-ACETAMINOPHEN 5-325 MG PO TABS
1.0000 | ORAL_TABLET | Freq: Once | ORAL | Status: AC
Start: 2013-07-23 — End: 2013-07-23
  Administered 2013-07-23: 1 via ORAL
  Filled 2013-07-23: qty 1

## 2013-07-23 MED ORDER — DEXAMETHASONE SODIUM PHOSPHATE 10 MG/ML IJ SOLN
10.0000 mg | Freq: Once | INTRAMUSCULAR | Status: AC
  Administered 2013-07-23: 10 mg via INTRAMUSCULAR
  Filled 2013-07-23: qty 1

## 2013-07-23 MED ORDER — PREDNISONE 10 MG PO TABS: ORAL_TABLET | ORAL | Status: DC

## 2013-07-23 NOTE — ED Provider Notes (Signed)
CSN: [id number]     Arrival date & time 07/23/13  0957 History   First MD Initiated Contact with Patient 07/23/13 1013     Chief Complaint  Patient presents with  . Back Pain     (Consider location/radiation/quality/duration/timing/severity/associated sxs/prior Treatment) HPI  Stephen Rowe is a 73 y.o. male who c/o left LB pain, radiating to left hip and left upper leg. The pain is not dependent on movement, or timing. The pain is constant. Also he has had urinary hesitancy, without dysuria, for several days.  He has had similar pain in the past, but not this bad. He has used an unspecified oral analgesic, without relief. He denies fever, chills, nausea, vomiting, chest pain, weakness, or dizziness. There are no other known modifying factors.   Past Medical History  Diagnosis Date  . CAD (coronary artery disease)     3 blockages, unchanged in past 15 years, s/p cardiac cath X 2 but no stents  . Arthritis   . Prostatitis   . Hyperlipidemia   . History of kidney stones   . GERD (gastroesophageal reflux disease)   . Skin cancer of face   . PONV (postoperative nausea and vomiting)   . Bulging disc     at L4 and L5   Past Surgical History  Procedure Laterality Date  . Kidney stone surgery    . Rotator cuff repair      right  . Cholecystectomy    . Knee surgery      right  . Hand surgery      right  . Skin cancer excision    . Colonoscopy  11/18/2011    Procedure: COLONOSCOPY;  Surgeon: Daneil Dolin, MD;  Location: [facility];  Service: Endoscopy;  Laterality: N/A;  8:45  . Joint replacement    . Knee surgery Right    Family History  Problem Relation Age of Onset  . Colon cancer Neg Hx    History  Substance Use Topics  . Smoking status: Former Smoker -- 1.00 packs/day for 4 years    Types: Cigarettes  . Smokeless tobacco: Not on file     Comment: quit about 45 years ago  . Alcohol Use: No    Review of Systems  All other systems reviewed and are  negative.     Allergies  Review of patient's allergies indicates no known allergies.  Home Medications   Prior to Admission medications   Medication Sig Start Date End Date Taking? Authorizing Provider  aspirin 81 MG tablet Take 81 mg by mouth daily.   Yes Historical Provider, MD  HYDROCODONE-ACETAMINOPHEN PO Take 1 tablet by mouth 4 (four) times daily as needed (pain).   Yes Historical Provider, MD  naproxen sodium (ANAPROX) 220 MG tablet Take 220-440 mg by mouth every 6 (six) hours as needed (pain).   Yes Historical Provider, MD  OVER THE COUNTER MEDICATION Take 1 tablet by mouth daily as needed (stomach acid). Over the counter tablet for stomach acid   Yes Historical Provider, MD  rosuvastatin (CRESTOR) 10 MG tablet Take 10 mg by mouth daily.   Yes Historical Provider, MD  diazepam (VALIUM) 10 MG tablet Take 1 tablet (10 mg total) by mouth every 6 (six) hours as needed (Muscle spasm). 07/23/13   Richarda Blade, MD  oxyCODONE-acetaminophen (PERCOCET) 5-325 MG per tablet Take 1 tablet by mouth every 4 (four) hours as needed for severe pain. 07/23/13   Richarda Blade, MD  predniSONE (DELTASONE)  10 MG tablet Take q day 6,5,4,3,2,1 07/23/13   Richarda Blade, MD   BP 112/60  Pulse 95  Resp 17  SpO2 95% Physical Exam  Nursing note and vitals reviewed. Constitutional: He is oriented to person, place, and time. He appears well-developed and well-nourished.  HENT:  Head: Normocephalic and atraumatic.  Right Ear: External ear normal.  Left Ear: External ear normal.  Eyes: Conjunctivae and EOM are normal. Pupils are equal, round, and reactive to light.  Neck: Normal range of motion and phonation normal. Neck supple.  Cardiovascular: Normal rate, regular rhythm, normal heart sounds and intact distal pulses.   Pulmonary/Chest: Effort normal and breath sounds normal. He exhibits no bony tenderness.  Abdominal: Soft. There is no tenderness.  Musculoskeletal: Normal range of motion.  Left low  back tenderness and left upper leg tenderness to palpation  Neurological: He is alert and oriented to person, place, and time. No cranial nerve deficit or sensory deficit. He exhibits normal muscle tone. Coordination normal.  Skin: Skin is warm, dry and intact.  Psychiatric: He has a normal mood and affect. His behavior is normal. Judgment and thought content normal.    ED Course  Procedures (including critical care time)  Medications  HYDROmorphone (DILAUDID) injection 1.5 mg (1.5 mg Intravenous Given 07/23/13 1108)  ondansetron (ZOFRAN-ODT) disintegrating tablet 8 mg (8 mg Oral Given 07/23/13 1102)  diazepam (VALIUM) injection 10 mg (10 mg Intravenous Given 07/23/13 1108)  dexamethasone (DECADRON) injection 10 mg (10 mg Intramuscular Given 07/23/13 1106)  HYDROmorphone (DILAUDID) injection 1 mg (1 mg Intravenous Given 07/23/13 1536)  oxyCODONE-acetaminophen (PERCOCET/ROXICET) 5-325 MG per tablet 1 tablet (1 tablet Oral Given 07/23/13 1818)  diazepam (VALIUM) tablet 5 mg (5 mg Oral Given 07/23/13 1817)    Patient Vitals for the past 24 hrs:  BP Pulse Resp SpO2  07/23/13 1534 112/60 mmHg 95 17 95 %  07/23/13 1530 - 100 23 95 %  07/23/13 1157 120/76 mmHg - 17 94 %  07/23/13 1145 124/78 mmHg 87 18 94 %  07/23/13 1130 128/70 mmHg 93 18 93 %  07/23/13 1115 139/93 mmHg 100 31 94 %  07/23/13 1100 134/76 mmHg 94 15 96 %  07/23/13 1045 131/81 mmHg 90 - 95 %  07/23/13 1030 134/84 mmHg 97 - 98 %  07/23/13 1023 131/83 mmHg 102 - 98 %    At discharge- Reevaluation with update and discussion. After initial assessment and treatment, an updated evaluation reveals he feels better. His pain is being controlled. Findings discussed with patient and family members, all questions answered. [place] Review Labs Reviewed - No data to display  Imaging Review Mr Lumbar Spine Wo Contrast  07/23/2013   CLINICAL DATA:  Chronic low back pain.  Left-sided symptoms.  EXAM: MRI LUMBAR SPINE WITHOUT  CONTRAST  TECHNIQUE: Multiplanar, multisequence MR imaging of the lumbar spine was performed. No intravenous contrast was administered.  COMPARISON:  MRI lumbar spine 09/20/2012.  FINDINGS: Normal signal is present in the conus medullaris which terminates at L1. Bilateral L5 pars defects are again noted. 9 mm anterolisthesis at L5-S1 is unchanged. Slight retrolisthesis at each level from T12-L1 through L4-5 is stable. Endplate marrow changes are similar to the prior study. Rightward curvature of the upper lumbar spine is centered at L1. Leftward curvature of the lower lumbar spine is centered at L4. Limited imaging of the abdomen is unremarkable.  T12-L1: A leftward disc protrusion is present. Mild facet hypertrophy is worse on the  left. Mild left foraminal narrowing is stable.  L1-2: A leftward disc protrusion is present. Mild facet hypertrophy is noted. Mild left lateral recess and foraminal narrowing is slightly worse.  L2-3: A leftward disc protrusion is present. Mild facet hypertrophy is noted bilaterally. Mild left lateral recess and bilateral foraminal narrowing is stable.  L3-4: A rightward disc protrusion is present. Asymmetric right-sided facet hypertrophy is noted. Mild right lateral recess and moderate right foraminal stenosis is stable.  L4-5: A broad-based disc protrusion is present. Mild facet hypertrophy is noted bilaterally. Mild foraminal narrowing bilaterally is unchanged.  L5-S1: Pars defects are present. There is uncovering of a broad-based disc protrusion. Moderate right lateral recess and foraminal stenosis is stable. Mild left foraminal narrowing is stable.  IMPRESSION: 1. S shaped scoliosis of the lumbar spine without significant interval change. 2. 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 3. Slight progression of mild left lateral recess and foraminal narrowing at L1-2. 4. Spinal stenosis is not significantly changed through the remainder of the lumbar spine as described above.    Electronically Signed   By: Lawrence Santiago M.D.   On: 07/23/2013 17:29     EKG Interpretation None      MDM   Final diagnoses:  Back pain, unspecified location  Lumbar spondylosis with myelopathy    Chronic back pain with exacerbation. MRI does not reveal acute significant change or evidence for need of surgical intervention. Patient has improved with symptomatic treatment, and is stable for discharge.   Nursing Notes Reviewed/ Care Coordinated Applicable Imaging Reviewed Interpretation of Laboratory Data incorporated into ED treatment  The patient appears reasonably screened and/or stabilized for discharge and I doubt any other medical condition or other [facility] requiring further screening, evaluation, or treatment in the ED at this time prior to discharge.  Plan: Home Medications- Prednisone, Valium, Percocet; Home Treatments- rest, heat; return here if the recommended treatment, does not improve the symptoms; Recommended follow up- Neurosurgery 1 week   Richarda Blade, MD 07/24/13 [phone]

## 2013-07-23 NOTE — Discharge Instructions (Signed)
Use heat on the sore area 3-4 times a day.

## 2013-07-23 NOTE — ED Notes (Signed)
Pt here for lower back pain, numbness to the left hip and down his leg. Has chronic issues with back at L4 and L5.

## 2013-07-23 NOTE — ED Notes (Signed)
Pt taken to MRI  

## 2013-07-23 NOTE — ED Notes (Signed)
Dr.Cram called, pt to be evaluated by EDP and probably needs MRI, call when seen to [phone]

## 2013-07-23 NOTE — ED Notes (Signed)
Placed on oxygen at 4 liters via .

## 2013-07-27 DIAGNOSIS — N200 Calculus of kidney: Secondary | ICD-10-CM | POA: Insufficient documentation

## 2013-07-27 DIAGNOSIS — M51369 Other intervertebral disc degeneration, lumbar region without mention of lumbar back pain or lower extremity pain: Secondary | ICD-10-CM | POA: Insufficient documentation

## 2013-07-27 DIAGNOSIS — M5136 Other intervertebral disc degeneration, lumbar region: Secondary | ICD-10-CM | POA: Insufficient documentation

## 2013-07-30 ENCOUNTER — Encounter (HOSPITAL_COMMUNITY): Payer: Self-pay | Admitting: Emergency Medicine

## 2013-07-30 ENCOUNTER — Other Ambulatory Visit: Payer: Self-pay | Admitting: Neurosurgery

## 2013-07-30 ENCOUNTER — Observation Stay (HOSPITAL_COMMUNITY)
Admission: EM | Admit: 2013-07-30 | Discharge: 2013-08-01 | Disposition: A | Discharge status: Home / Self Care | Payer: Medicare Other | Attending: Internal Medicine | Admitting: Internal Medicine

## 2013-07-30 DIAGNOSIS — K219 Gastro-esophageal reflux disease without esophagitis: Secondary | ICD-10-CM

## 2013-07-30 DIAGNOSIS — M47816 Spondylosis without myelopathy or radiculopathy, lumbar region: Secondary | ICD-10-CM

## 2013-07-30 DIAGNOSIS — M546 Pain in thoracic spine: Secondary | ICD-10-CM

## 2013-07-30 DIAGNOSIS — N200 Calculus of kidney: Secondary | ICD-10-CM

## 2013-07-30 DIAGNOSIS — Z85828 Personal history of other malignant neoplasm of skin: Secondary | ICD-10-CM | POA: Insufficient documentation

## 2013-07-30 DIAGNOSIS — M5126 Other intervertebral disc displacement, lumbar region: Principal | ICD-10-CM | POA: Insufficient documentation

## 2013-07-30 DIAGNOSIS — Z87891 Personal history of nicotine dependence: Secondary | ICD-10-CM | POA: Insufficient documentation

## 2013-07-30 DIAGNOSIS — I2510 Atherosclerotic heart disease of native coronary artery without angina pectoris: Secondary | ICD-10-CM | POA: Insufficient documentation

## 2013-07-30 DIAGNOSIS — F430 Acute stress reaction: Secondary | ICD-10-CM | POA: Insufficient documentation

## 2013-07-30 DIAGNOSIS — Z7982 Long term (current) use of aspirin: Secondary | ICD-10-CM | POA: Insufficient documentation

## 2013-07-30 DIAGNOSIS — M5442 Lumbago with sciatica, left side: Secondary | ICD-10-CM

## 2013-07-30 DIAGNOSIS — R1031 Right lower quadrant pain: Secondary | ICD-10-CM

## 2013-07-30 DIAGNOSIS — Z96659 Presence of unspecified artificial knee joint: Secondary | ICD-10-CM | POA: Insufficient documentation

## 2013-07-30 DIAGNOSIS — M5137 Other intervertebral disc degeneration, lumbosacral region: Secondary | ICD-10-CM | POA: Insufficient documentation

## 2013-07-30 DIAGNOSIS — E785 Hyperlipidemia, unspecified: Secondary | ICD-10-CM | POA: Insufficient documentation

## 2013-07-30 DIAGNOSIS — M51379 Other intervertebral disc degeneration, lumbosacral region without mention of lumbar back pain or lower extremity pain: Secondary | ICD-10-CM | POA: Insufficient documentation

## 2013-07-30 DIAGNOSIS — M129 Arthropathy, unspecified: Secondary | ICD-10-CM | POA: Insufficient documentation

## 2013-07-30 DIAGNOSIS — IMO0001 Reserved for inherently not codable concepts without codable children: Secondary | ICD-10-CM

## 2013-07-30 DIAGNOSIS — K5730 Diverticulosis of large intestine without perforation or abscess without bleeding: Secondary | ICD-10-CM | POA: Insufficient documentation

## 2013-07-30 DIAGNOSIS — M6281 Muscle weakness (generalized): Secondary | ICD-10-CM

## 2013-07-30 DIAGNOSIS — Z1211 Encounter for screening for malignant neoplasm of colon: Secondary | ICD-10-CM

## 2013-07-30 DIAGNOSIS — M25519 Pain in unspecified shoulder: Secondary | ICD-10-CM

## 2013-07-30 DIAGNOSIS — M549 Dorsalgia, unspecified: Secondary | ICD-10-CM

## 2013-07-30 LAB — I-STAT CHEM 8, ED
BUN: 21 mg/dL (ref 6–23)
CALCIUM ION: 1.17 mmol/L (ref 1.13–1.30)
CHLORIDE: 100 meq/L (ref 96–112)
CREATININE: 1 mg/dL (ref 0.50–1.35)
GLUCOSE: 129 mg/dL — AB (ref 70–99)
HEMATOCRIT: 53 % — AB (ref 39.0–52.0)
Hemoglobin: 18 g/dL — ABNORMAL HIGH (ref 13.0–17.0)
POTASSIUM: 4.3 meq/L (ref 3.7–5.3)
SODIUM: 137 meq/L (ref 137–147)
TCO2: 26 mmol/L (ref 0–100)

## 2013-07-30 LAB — CBC
HCT: 50.7 % (ref 39.0–52.0)
HCT: 49.4 % (ref 39.0–52.0)
HEMOGLOBIN: 17.5 g/dL — AB (ref 13.0–17.0)
Hemoglobin: 17.9 g/dL — ABNORMAL HIGH (ref 13.0–17.0)
MCH: 31 pg (ref 26.0–34.0)
MCH: 30.7 pg (ref 26.0–34.0)
MCHC: 35.3 g/dL (ref 30.0–36.0)
MCHC: 35.4 g/dL (ref 30.0–36.0)
MCV: 87.9 fL (ref 78.0–100.0)
MCV: 86.7 fL (ref 78.0–100.0)
PLATELETS: 199 10*3/uL (ref 150–400)
PLATELETS: 213 10*3/uL (ref 150–400)
RBC: 5.77 MIL/uL (ref 4.22–5.81)
RBC: 5.7 MIL/uL (ref 4.22–5.81)
RDW: 13 % (ref 11.5–15.5)
RDW: 13 % (ref 11.5–15.5)
WBC: 9.6 10*3/uL (ref 4.0–10.5)
WBC: 10.4 10*3/uL (ref 4.0–10.5)

## 2013-07-30 LAB — CREATININE, SERUM
CREATININE: 0.93 mg/dL (ref 0.50–1.35)
GFR calc Af Amer: >90 mL/min (ref >90)
GFR calc non Af Amer: 83 mL/min — ABNORMAL LOW (ref >90)

## 2013-07-30 LAB — URINALYSIS, ROUTINE W REFLEX MICROSCOPIC
BILIRUBIN URINE: NEGATIVE
Glucose, UA: NEGATIVE mg/dL
Hgb urine dipstick: NEGATIVE
KETONES UR: NEGATIVE mg/dL
Leukocytes, UA: NEGATIVE
NITRITE: NEGATIVE
PROTEIN: NEGATIVE mg/dL
SPECIFIC GRAVITY, URINE: 1.025 (ref 1.005–1.030)
UROBILINOGEN UA: 0.2 mg/dL (ref 0.0–1.0)
pH: 5 (ref 5.0–8.0)

## 2013-07-30 MED ORDER — OXYCODONE-ACETAMINOPHEN 5-325 MG PO TABS
1.0000 | ORAL_TABLET | Freq: Once | ORAL | Status: AC
  Administered 2013-07-30: 1 via ORAL
  Filled 2013-07-30: qty 1

## 2013-07-30 MED ORDER — ONDANSETRON 4 MG PO TBDP
8.0000 mg | ORAL_TABLET | Freq: Once | ORAL | Status: AC
  Administered 2013-07-30: 8 mg via ORAL
  Filled 2013-07-30: qty 2

## 2013-07-30 MED ORDER — DEXAMETHASONE SODIUM PHOSPHATE 10 MG/ML IJ SOLN
10.0000 mg | Freq: Once | INTRAMUSCULAR | Status: AC
  Administered 2013-07-30: 10 mg via INTRAVENOUS
  Filled 2013-07-30: qty 1

## 2013-07-30 MED ORDER — HEPARIN SODIUM (PORCINE) 5000 UNIT/ML IJ SOLN
5000.0000 [IU] | Freq: Three times a day (TID) | INTRAMUSCULAR | Status: DC
  Administered 2013-07-30 – 2013-08-01 (×5): 5000 [IU] via SUBCUTANEOUS
  Filled 2013-07-30 (×9): qty 1

## 2013-07-30 MED ORDER — ASPIRIN EC 81 MG PO TBEC
81.0000 mg | DELAYED_RELEASE_TABLET | Freq: Every day | ORAL | Status: DC
  Administered 2013-07-31 – 2013-08-01 (×2): 81 mg via ORAL
  Filled 2013-07-30 (×2): qty 1

## 2013-07-30 MED ORDER — DIAZEPAM 5 MG PO TABS: 10.0000 mg | ORAL_TABLET | Freq: Four times a day (QID) | ORAL | Status: DC | PRN

## 2013-07-30 MED ORDER — HYDROMORPHONE HCL PF 1 MG/ML IJ SOLN
0.5000 mg | INTRAMUSCULAR | Status: DC | PRN
  Administered 2013-07-31 (×2): 0.5 mg via INTRAVENOUS
  Filled 2013-07-30 (×2): qty 1

## 2013-07-30 MED ORDER — ATORVASTATIN CALCIUM 20 MG PO TABS
20.0000 mg | ORAL_TABLET | Freq: Every day | ORAL | Status: DC
  Filled 2013-07-30 (×3): qty 1

## 2013-07-30 MED ORDER — HYDROMORPHONE HCL PF 1 MG/ML IJ SOLN
1.0000 mg | Freq: Once | INTRAMUSCULAR | Status: AC
  Administered 2013-07-30: 1 mg via INTRAVENOUS
  Filled 2013-07-30: qty 1

## 2013-07-30 MED ORDER — SODIUM CHLORIDE 0.9 % IV SOLN
INTRAVENOUS | Status: DC
  Administered 2013-07-30 – 2013-07-31 (×2): via INTRAVENOUS

## 2013-07-30 NOTE — ED Notes (Addendum)
Pt reports having lower back pain, hx of same. Had MRI done and recent back injections. Recently went to [facility] and had ct scan to r/o kidney stone. Pt receently started on oxycodone, valium and prednisone. Scheduled to started on medrol today, but sent here for possible admission and pain control. Pt of dr Saintclair Halsted, was told that neurosurgeon on call will come see pt in ED.

## 2013-07-30 NOTE — ED Notes (Signed)
Urinal given to pt.  Unable to urinate at this time.

## 2013-07-30 NOTE — H&P (Addendum)
Hospitalist Admission History and Physical  Patient name: Stephen Rowe Medical record number: [id number] Date of birth: 13-Jul-1940 Age: 73 y.o. Gender: male  Primary Care Provider: Glo Herring., MD  Chief Complaint: back pain  History of Present Illness:This is a 73 y.o. year old male with prior hx/o L4-L5 bulging disc, CAD, prostatic nodule and nephrolithiasis presenting with back pain. Family states that pt has been seen by neurosurgery for low back pain. Has been present for approx 2 years. Has had progressive worsening of LBP over past 2 weeks. Pt/family deny any recent strenuous activity. No subjective fevers or chills. Was seen in ER for sxs last week. Had MRI done that showed scoliosis, bilateral L5 pars defect, L1-L2 foraminal narrowing and unchanged spinal stenosis. Pt was given prednisone dose pack, percocet and valium. Pain persisted through week. Followed up with NS on Friday of last week. Plan for for ? ESI this week. Family was also offered inpt admission for pain management. Family deferred. Pain has worsened over the weekend. Was also seen at [facility] for back pain. Had CT that showed small kidney stones in ureter, but no passing stones per family. Pt does report some increased urinary frequency. No dysuria. No prior hx/o prostatitis. Pain is predominantly L sided with radicular pain down L leg with some numbness, tingling in L 3rd-5th toe. Is able to ambulate. Followed up with PCP about sxs today who discussed with NS who recommended admission for pain control. EDP also discussed case with NS. They will consult in am.  On presentation to the ER, hemodynamically stable. Afebrile. Blood pressure stable. Blood work WNL thus far. UA pending.   Assessment and Plan: Stephen Rowe is a 69 y.o. year old male presenting with Back Pain   Back Pain: Suspect MSK source. Noted radicular distribution of pain.  DDx also includes nephrolithiasis, ? GU infection. F/u on CT  imaging from [facility].  Follow up on NS recs. Noted mild prostatic tenderness on exam today. Start on IV cipro. UA pending. Urine culture. Continue to follow. IV dilaudid low dose prn. Decadron x1.   CAD: Clinically stable. Continue ASA.  FEN/GI: heart healthy diet.  Prophylaxis: sub q heparin  Disposition: pending further Code Status:Full Code    Patient Active Problem List   Diagnosis Date Noted  . Back pain 07/30/2013  . Encounter for screening colonoscopy 10/26/2011  . RLQ discomfort 10/26/2011  . Reflux 10/26/2011  . Pain in joint, shoulder region 08/12/2010  . Complete rupture of rotator cuff 08/12/2010  . Muscle weakness (generalized) 08/12/2010   Past Medical History: Past Medical History  Diagnosis Date  . CAD (coronary artery disease)     3 blockages, unchanged in past 15 years, s/p cardiac cath X 2 but no stents  . Arthritis   . Prostatitis   . Hyperlipidemia   . History of kidney stones   . GERD (gastroesophageal reflux disease)   . Skin cancer of face   . PONV (postoperative nausea and vomiting)   . Bulging disc     at L4 and L5    Past Surgical History: Past Surgical History  Procedure Laterality Date  . Kidney stone surgery    . Rotator cuff repair      right  . Cholecystectomy    . Knee surgery      right  . Hand surgery      right  . Skin cancer excision    . Colonoscopy  11/18/2011    Procedure:  COLONOSCOPY;  Surgeon: Daneil Dolin, MD;  Location: [facility];  Service: Endoscopy;  Laterality: N/A;  8:45  . Joint replacement    . Knee surgery Right     Social History: History   Social History  . Marital Status: Married    Spouse Name: N/A    Number of Children: N/A  . Years of Education: N/A   Occupational History  . retired     ran a Economist   Social History Main Topics  . Smoking status: Former Smoker -- 1.00 packs/day for 4 years    Types: Cigarettes  . Smokeless tobacco: None     Comment: quit about 45 years ago  . Alcohol  Use: No  . Drug Use: No  . Sexual Activity: None   Other Topics Concern  . None   Social History Narrative  . None    Family History: Family History  Problem Relation Age of Onset  . Colon cancer Neg Hx     Allergies: No Known Allergies  Current Facility-Administered Medications  Medication Dose Route Frequency Provider Last Rate Last Dose  . 0.9 %  sodium chloride infusion   Intravenous Continuous Shanda Howells, MD      . aspirin EC tablet 81 mg  81 mg Oral Daily Shanda Howells, MD      . Derrill Memo ON 07/31/2013] atorvastatin (LIPITOR) tablet 20 mg  20 mg Oral q1800 Shanda Howells, MD      . dexamethasone (DECADRON) injection 10 mg  10 mg Intravenous Once Shanda Howells, MD      . diazepam (VALIUM) tablet 10 mg  10 mg Oral Q6H PRN Shanda Howells, MD      . heparin injection 5,000 Units  5,000 Units Subcutaneous 3 times per day Shanda Howells, MD      . HYDROmorphone (DILAUDID) injection 0.5 mg  0.5 mg Intravenous Q2H PRN Shanda Howells, MD       Current Outpatient Prescriptions  Medication Sig Dispense Refill  . aspirin EC 81 MG tablet Take 81 mg by mouth daily.      . diazepam (VALIUM) 10 MG tablet Take 10 mg by mouth every 6 (six) hours as needed (Muscle spasm).      Marland Kitchen HYDROCODONE-ACETAMINOPHEN PO Take 1 tablet by mouth 4 (four) times daily as needed (pain).      Marland Kitchen OVER THE COUNTER MEDICATION Take 1 tablet by mouth daily as needed (stomach acid). Over the counter tablet for stomach acid      . oxyCODONE-acetaminophen (PERCOCET/ROXICET) 5-325 MG per tablet Take 1 tablet by mouth every 4 (four) hours as needed for severe pain.      . predniSONE (DELTASONE) 10 MG tablet Take 10 mg by mouth daily with breakfast. Take 6 tablets on day 1, 5 tablets on day 2, 4 tablets on day 3, 3 tablets on day 4, 2 tablets on day 5, 1 tablet on day 6, then d/c      . rosuvastatin (CRESTOR) 10 MG tablet Take 10 mg by mouth daily.       Review Of Systems: 12 point ROS negative except as noted above in  HPI.  Physical Exam: Filed Vitals:   07/30/13 1815  BP: 112/70  Pulse: 82  Temp:   Resp: 17    General: alert and cooperative HEENT: PERRLA and extra ocular movement intact Heart: S1, S2 normal, no murmur, rub or gallop, regular rate and rhythm Lungs: clear to auscultation, no wheezes or rales and unlabored breathing  Abdomen: abdomen is soft without significant tenderness, masses, organomegaly or guarding, + mild prostatic tenderness on DRE.  Extremities: extremities normal, atraumatic, no cyanosis or edema Skin:no rashes, no ecchymoses Neurology: normal without focal findings  Labs and Imaging: Lab Results  Component Value Date/Time   NA 137 07/30/2013  6:07 PM   K 4.3 07/30/2013  6:07 PM   CL 100 07/30/2013  6:07 PM   CO2 26 12/13/2009  6:13 AM   BUN 21 07/30/2013  6:07 PM   CREATININE 1.00 07/30/2013  6:07 PM   GLUCOSE 129* 07/30/2013  6:07 PM   Lab Results  Component Value Date   WBC 9.6 07/30/2013   HGB 18.0* 07/30/2013   HCT 53.0* 07/30/2013   MCV 87.9 07/30/2013   PLT 199 07/30/2013    No results found.         Shanda Howells MD  Pager: [phone]

## 2013-07-30 NOTE — ED Provider Notes (Signed)
CSN: [id number]     Arrival date & time 07/30/13  1552 History   First MD Initiated Contact with Patient 07/30/13 1722     Chief Complaint  Patient presents with  . Back Pain     (Consider location/radiation/quality/duration/timing/severity/associated sxs/prior Treatment) Patient is a 73 y.o. male presenting with back pain. The history is provided by the patient.  Back Pain Pain location: lower back. worse on left. Quality:  Aching Radiates to: occasionally down left leg. Pain severity:  Severe Onset quality:  Gradual Timing:  Constant Progression:  Worsening Chronicity:  Chronic Context comment:  No injury Relieved by: mild improvement with meds (prednisone, narcotic, valium) Worsened by:  Movement Associated symptoms: numbness (occasionally down left leg)   Associated symptoms: no abdominal pain, no bladder incontinence, no bowel incontinence, no chest pain, no fever, no headaches, no tingling and no weakness     73 yo male pw back pain. Chronic. Worsened past ~10 days. Seen in ED last week for same. MRI largely unchanged from prior. Went to see his NSX 3-4 days ago. [facility] after seeing NSX. Reportedly had negative CT scan for kidneys tones. Pain has persisted. Went to PCP today. Called NSX office. Directed to ED for admission for pain control.  Occasional numbness down left leg x2+ months. Unchanged.  Mild constipation with pain meds. Now improved.  No change in urination. Known h/o enlarged prostate.  No tenesmus.   Past Medical History  Diagnosis Date  . CAD (coronary artery disease)     3 blockages, unchanged in past 15 years, s/p cardiac cath X 2 but no stents  . Arthritis   . Prostatitis   . Hyperlipidemia   . History of kidney stones   . GERD (gastroesophageal reflux disease)   . Skin cancer of face   . PONV (postoperative nausea and vomiting)   . Bulging disc     at L4 and L5   Past Surgical History  Procedure Laterality Date  . Kidney stone  surgery    . Rotator cuff repair      right  . Cholecystectomy    . Knee surgery      right  . Hand surgery      right  . Skin cancer excision    . Colonoscopy  11/18/2011    Procedure: COLONOSCOPY;  Surgeon: Daneil Dolin, MD;  Location: [facility];  Service: Endoscopy;  Laterality: N/A;  8:45  . Joint replacement    . Knee surgery Right    Family History  Problem Relation Age of Onset  . Colon cancer Neg Hx    History  Substance Use Topics  . Smoking status: Former Smoker -- 1.00 packs/day for 4 years    Types: Cigarettes  . Smokeless tobacco: Not on file     Comment: quit about 45 years ago  . Alcohol Use: No    Review of Systems  Constitutional: Negative for fever and chills.  HENT: Negative for congestion and rhinorrhea.   Respiratory: Negative for cough and shortness of breath.   Cardiovascular: Negative for chest pain and leg swelling.  Gastrointestinal: Negative for nausea, vomiting, abdominal pain, diarrhea and bowel incontinence.  Genitourinary: Positive for difficulty urinating (chronic 2/2 enlarged prostate. no change). Negative for bladder incontinence and flank pain.  Musculoskeletal: Positive for back pain.  Skin: Negative for wound.  Neurological: Positive for numbness (occasionally down left leg). Negative for dizziness, tingling, weakness and headaches.  All other systems reviewed and are  negative.     Allergies  Review of patient's allergies indicates no known allergies.  Home Medications   Prior to Admission medications   Medication Sig Start Date End Date Taking? Authorizing Riverlyn Kizziah  aspirin EC 81 MG tablet Take 81 mg by mouth daily.   Yes Historical Saiya Crist, MD  diazepam (VALIUM) 10 MG tablet Take 10 mg by mouth every 6 (six) hours as needed (Muscle spasm). 07/23/13  Yes Richarda Blade, MD  HYDROCODONE-ACETAMINOPHEN PO Take 1 tablet by mouth 4 (four) times daily as needed (pain).   Yes Historical Kathlynn Swofford, MD  OVER THE COUNTER MEDICATION  Take 1 tablet by mouth daily as needed (stomach acid). Over the counter tablet for stomach acid   Yes Historical Capone Schwinn, MD  oxyCODONE-acetaminophen (PERCOCET/ROXICET) 5-325 MG per tablet Take 1 tablet by mouth every 4 (four) hours as needed for severe pain. 07/23/13  Yes Richarda Blade, MD  predniSONE (DELTASONE) 10 MG tablet Take 10 mg by mouth daily with breakfast. Take 6 tablets on day 1, 5 tablets on day 2, 4 tablets on day 3, 3 tablets on day 4, 2 tablets on day 5, 1 tablet on day 6, then d/c   Yes Historical Daivik Overley, MD  rosuvastatin (CRESTOR) 10 MG tablet Take 10 mg by mouth daily.   Yes Historical Lucill Mauck, MD   BP 109/70  Pulse 92  Temp(Src) 97.7 F (36.5 C) (Oral)  Resp 18  SpO2 94% Physical Exam  Nursing note and vitals reviewed. Constitutional: He is oriented to person, place, and time. He appears well-developed and well-nourished. No distress.  HENT:  Head: Normocephalic and atraumatic.  Eyes: Conjunctivae are normal. Right eye exhibits no discharge. Left eye exhibits no discharge.  Neck: No tracheal deviation present.  Cardiovascular: Normal rate, regular rhythm, normal heart sounds and intact distal pulses.   Pulmonary/Chest: Effort normal and breath sounds normal. No stridor. No respiratory distress. He has no wheezes. He has no rales.  Abdominal: Soft. There is no tenderness. There is no guarding.  Genitourinary: Prostate is enlarged. Prostate is not tender.  Musculoskeletal: He exhibits tenderness (mildly reproducible left low back pain.). He exhibits no edema.  Full ROM b/l hips and knees  Neurological: He is alert and oriented to person, place, and time. He has normal strength. No sensory deficit.  No clonus  Skin: Skin is warm and dry.  Psychiatric: He has a normal mood and affect. His behavior is normal.    ED Course  Procedures (including critical care time) Labs Review Labs Reviewed  CBC - Abnormal; Notable for the following:    Hemoglobin 17.9 (*)     All other components within normal limits  CBC - Abnormal; Notable for the following:    Hemoglobin 17.5 (*)    All other components within normal limits  CREATININE, SERUM - Abnormal; Notable for the following:    GFR calc non Af Amer 83 (*)    All other components within normal limits  I-STAT CHEM 8, ED - Abnormal; Notable for the following:    Glucose, Bld 129 (*)    Hemoglobin 18.0 (*)    HCT 53.0 (*)    All other components within normal limits  URINE CULTURE  URINALYSIS, ROUTINE W REFLEX MICROSCOPIC  CBC WITH DIFFERENTIAL  COMPREHENSIVE METABOLIC PANEL    Imaging Review No results found.   EKG Interpretation None      MDM   Final diagnoses:  Left-sided back pain, unspecified location  Back pain. Chronic. Worsening. Failing outpatient pain mgmt. Directed to ED by PCP with plan for admission and NSX eval. NSX aware. Given dilaudid. Admitted to medicine.  Without concerning features for spinal cord pathology. NV intact distally.  Recent US neg for AAA.  No hematuria. Do not suspect stone.   Labs and imaging reviewed by myself and considered in medical decision making if ordered. Imaging interpreted by radiology.   Discussed case with Dr. Kathrynn Humble who is in agreement with assessment and plan.    Bonnita Hollow, MD 07/31/13 [phone]

## 2013-07-30 NOTE — ED Notes (Signed)
Attempted to call report. Floor RN unable to accept report.  

## 2013-07-31 ENCOUNTER — Other Ambulatory Visit: Payer: Medicare Other

## 2013-07-31 ENCOUNTER — Observation Stay (HOSPITAL_COMMUNITY): Payer: Medicare Other

## 2013-07-31 DIAGNOSIS — M5430 Sciatica, unspecified side: Secondary | ICD-10-CM

## 2013-07-31 DIAGNOSIS — M539 Dorsopathy, unspecified: Secondary | ICD-10-CM

## 2013-07-31 DIAGNOSIS — N200 Calculus of kidney: Secondary | ICD-10-CM

## 2013-07-31 LAB — CBC WITH DIFFERENTIAL/PLATELET
Basophils Absolute: 0 10*3/uL (ref 0.0–0.1)
Basophils Relative: 0 % (ref 0–1)
EOS ABS: 0 10*3/uL (ref 0.0–0.7)
Eosinophils Relative: 0 % (ref 0–5)
HCT: 48.4 % (ref 39.0–52.0)
Hemoglobin: 17 g/dL (ref 13.0–17.0)
LYMPHS ABS: 1.4 10*3/uL (ref 0.7–4.0)
Lymphocytes Relative: 14 % (ref 12–46)
MCH: 30.6 pg (ref 26.0–34.0)
MCHC: 35.1 g/dL (ref 30.0–36.0)
MCV: 87.2 fL (ref 78.0–100.0)
MONOS PCT: 2 % — AB (ref 3–12)
Monocytes Absolute: 0.2 10*3/uL (ref 0.1–1.0)
Neutro Abs: 8.7 10*3/uL — ABNORMAL HIGH (ref 1.7–7.7)
Neutrophils Relative %: 84 % — ABNORMAL HIGH (ref 43–77)
Platelets: 201 10*3/uL (ref 150–400)
RBC: 5.55 MIL/uL (ref 4.22–5.81)
RDW: 12.8 % (ref 11.5–15.5)
WBC: 10.3 10*3/uL (ref 4.0–10.5)

## 2013-07-31 LAB — URINE CULTURE
CULTURE: NO GROWTH
Colony Count: NO GROWTH

## 2013-07-31 LAB — COMPREHENSIVE METABOLIC PANEL
ALT: 49 U/L (ref 0–53)
AST: 25 U/L (ref 0–37)
Albumin: 3.2 g/dL — ABNORMAL LOW (ref 3.5–5.2)
Alkaline Phosphatase: 70 U/L (ref 39–117)
BUN: 20 mg/dL (ref 6–23)
CO2: 26 mEq/L (ref 19–32)
Calcium: 8.8 mg/dL (ref 8.4–10.5)
Chloride: 101 mEq/L (ref 96–112)
Creatinine, Ser: 0.96 mg/dL (ref 0.50–1.35)
GFR calc non Af Amer: 81 mL/min — ABNORMAL LOW (ref >90)
GLUCOSE: 165 mg/dL — AB (ref 70–99)
Potassium: 5.2 mEq/L (ref 3.7–5.3)
SODIUM: 138 meq/L (ref 137–147)
TOTAL PROTEIN: 6.3 g/dL (ref 6.0–8.3)
Total Bilirubin: 0.6 mg/dL (ref 0.3–1.2)

## 2013-07-31 MED ORDER — SENNA 8.6 MG PO TABS
2.0000 | ORAL_TABLET | Freq: Every day | ORAL | Status: DC
  Administered 2013-07-31: 17.2 mg via ORAL
  Filled 2013-07-31 (×2): qty 2

## 2013-07-31 MED ORDER — KETOROLAC TROMETHAMINE 30 MG/ML IJ SOLN
30.0000 mg | Freq: Three times a day (TID) | INTRAMUSCULAR | Status: DC | PRN
  Administered 2013-07-31: 30 mg via INTRAVENOUS
  Filled 2013-07-31: qty 1

## 2013-07-31 MED ORDER — KETOROLAC TROMETHAMINE 30 MG/ML IJ SOLN
30.0000 mg | Freq: Once | INTRAMUSCULAR | Status: AC
  Administered 2013-07-31: 30 mg via INTRAVENOUS
  Filled 2013-07-31: qty 1

## 2013-07-31 MED ORDER — OXYCODONE HCL 5 MG PO TABS: 10.0000 mg | ORAL_TABLET | Freq: Four times a day (QID) | ORAL | Status: DC | PRN

## 2013-07-31 MED ORDER — GI COCKTAIL ~~LOC~~
30.0000 mL | Freq: Once | ORAL | Status: AC
  Administered 2013-07-31: 30 mL via ORAL
  Filled 2013-07-31: qty 30

## 2013-07-31 MED ORDER — OXYCODONE HCL ER 15 MG PO T12A
15.0000 mg | EXTENDED_RELEASE_TABLET | Freq: Two times a day (BID) | ORAL | Status: DC
  Administered 2013-07-31 – 2013-08-01 (×3): 15 mg via ORAL
  Filled 2013-07-31 (×3): qty 1

## 2013-07-31 MED ORDER — ONDANSETRON HCL 4 MG/2ML IJ SOLN: 4.0000 mg | Freq: Four times a day (QID) | INTRAMUSCULAR | Status: DC | PRN

## 2013-07-31 MED ORDER — POLYETHYLENE GLYCOL 3350 17 G PO PACK
17.0000 g | PACK | Freq: Every day | ORAL | Status: DC
  Administered 2013-07-31 – 2013-08-01 (×2): 17 g via ORAL
  Filled 2013-07-31 (×2): qty 1

## 2013-07-31 MED ORDER — OXYCODONE HCL 5 MG PO TABS
10.0000 mg | ORAL_TABLET | Freq: Four times a day (QID) | ORAL | Status: DC | PRN
  Administered 2013-07-31 – 2013-08-01 (×2): 10 mg via ORAL
  Filled 2013-07-31 (×2): qty 2

## 2013-07-31 MED ORDER — CIPROFLOXACIN IN D5W 400 MG/200ML IV SOLN
400.0000 mg | Freq: Two times a day (BID) | INTRAVENOUS | Status: DC
  Administered 2013-07-31 (×2): 400 mg via INTRAVENOUS
  Filled 2013-07-31 (×3): qty 200

## 2013-07-31 NOTE — Progress Notes (Signed)
UR completed 

## 2013-07-31 NOTE — Progress Notes (Signed)
PT Cancellation Note  Patient Details Name: LAUREL SMELTZ MRN: [id number] DOB: 15-Aug-1940   Cancelled Treatment:    Reason Eval/Treat Not Completed: Patient declined, states he's hurting too much.   07/31/2013  Donnella Sham, [place] [phone]  (pager)   [place]tinger, Tessie Fass 07/31/2013, 3:19 PM

## 2013-07-31 NOTE — Consult Note (Signed)
CC:  Chief Complaint  Patient presents with  . Back Pain    HPI: Stephen Rowe is a 73 y.o. male admitted for control of intractable back pain. He is a pt of Dr. Kary Kos and was recently seen in the outpatient clinic with a known history of multi-level lumbar spondylosis. He has been having exacerbation of his left-sided low back pain for about 1.5 weeks now, located on the left side of his low back, along with a sharp pain in his hip, and some pain radiation around into his left groin. He has not had any dysuria, but says that the pain is similar to what he experienced previously when he had a kidney stone. He apparently did undergo a CT abd/pelvis in [facility] where 2 kidney stones were seen and he was evaluated by his urologist who told him the stones could cause pain, but were not causing obstruction.   He is not experiencing any new N/T/W. He has recently completed a course of prednisone, and has been taking percocet and valium at home which helped with the pain for 2-3 hours but he was only scheduled to take it every 4 hours. Because of this he was seen by his PCP yesterday and was sent to [facility] for admission for pain control.  PMH: Past Medical History  Diagnosis Date  . CAD (coronary artery disease)     3 blockages, unchanged in past 15 years, s/p cardiac cath X 2 but no stents  . Arthritis   . Prostatitis   . Hyperlipidemia   . History of kidney stones   . GERD (gastroesophageal reflux disease)   . Skin cancer of face   . PONV (postoperative nausea and vomiting)   . Bulging disc     at L4 and L5    PSH: Past Surgical History  Procedure Laterality Date  . Kidney stone surgery    . Rotator cuff repair      right  . Cholecystectomy    . Knee surgery      right  . Hand surgery      right  . Skin cancer excision    . Colonoscopy  11/18/2011    Procedure: COLONOSCOPY;  Surgeon: Daneil Dolin, MD;  Location: [facility];  Service: Endoscopy;  Laterality: N/A;  8:45  .  Joint replacement    . Knee surgery Right     SH: History  Substance Use Topics  . Smoking status: Former Smoker -- 1.00 packs/day for 4 years    Types: Cigarettes  . Smokeless tobacco: Not on file     Comment: quit about 45 years ago  . Alcohol Use: No    MEDS: Prior to Admission medications   Medication Sig Start Date End Date Taking? Authorizing Provider  aspirin EC 81 MG tablet Take 81 mg by mouth daily.   Yes Historical Provider, MD  diazepam (VALIUM) 10 MG tablet Take 10 mg by mouth every 6 (six) hours as needed (Muscle spasm). 07/23/13  Yes Richarda Blade, MD  HYDROCODONE-ACETAMINOPHEN PO Take 1 tablet by mouth 4 (four) times daily as needed (pain).   Yes Historical Provider, MD  OVER THE COUNTER MEDICATION Take 1 tablet by mouth daily as needed (stomach acid). Over the counter tablet for stomach acid   Yes Historical Provider, MD  oxyCODONE-acetaminophen (PERCOCET/ROXICET) 5-325 MG per tablet Take 1 tablet by mouth every 4 (four) hours as needed for severe pain. 07/23/13  Yes Richarda Blade, MD  predniSONE (DELTASONE) 10 MG tablet Take 10 mg by mouth daily with breakfast. Take 6 tablets on day 1, 5 tablets on day 2, 4 tablets on day 3, 3 tablets on day 4, 2 tablets on day 5, 1 tablet on day 6, then d/c   Yes Historical Provider, MD  rosuvastatin (CRESTOR) 10 MG tablet Take 10 mg by mouth daily.   Yes Historical Provider, MD    ALLERGY: No Known Allergies  ROS: ROS  NEUROLOGIC EXAM: Awake, alert, oriented Memory and concentration grossly intact Speech fluent, appropriate CN grossly intact Motor exam: Upper Extremities Deltoid Bicep Tricep Grip  Right 5/5 5/5 5/5 5/5  Left 5/5 5/5 5/5 5/5   Lower Extremity IP Quad PF DF EHL  Right 5/5 5/5 5/5 5/5 5/5  Left 5/5 5/5 5/5 5/5 5/5   Sensation grossly intact to LT  [facility]: Recent MRI L spine demonstrates multilevel spondylotic change, essentially stable in comparison to exam dated Aug 2014.  IMPRESSION: - 73 y.o.  male with exacerbation of left-sided back and groin pain. Etiology is unclear but does not appear to follow a clear dermatomal pattern. I suspect this may be more related to his kidney stones, however the possibility of facet-mediated pain is also possible.  PLAN: - CT abd/pelvis ordered to confirm stones - May consider adding a longer-acting narcotic with Oxycodone for breakthrough pain - If pts pain is not significantly improved by oral meds and mobilization with PT, could also consider left-sided L5-S1 facet block.

## 2013-07-31 NOTE — Progress Notes (Signed)
PATIENT DETAILS Name: Stephen Rowe Age: 73 y.o. Sex: male Date of Birth: [birth year] Admit Date: 07/30/2013 Admitting Physician Shanda Howells, MD [id number]., MD  Subjective: Feels slightly better. Admitted with back pain radiating to the left groin and left leg as well.  Assessment/Plan: Active Problems:  Intractable Back pain -recent MRI LS spine on 6/22 negative for any acute abnormalities. Known hx of degenerative disc disease.Admitted for pain control and supportive care.Just recently finished a course of tapering prednisone. -Suspect that pain is mostly secondary radiculopathy-but has some groin pain as well.Given hx of Renal Stones, will check CT abd/pelvis, however UA neg for RBC/Wbc. Since UA benign,stop Cipro. -Attempt to ambulate with PT, await Neurosurgery eval (Dr Vivien Rota need Facet injection prior to discharge.  CAD -c/w ASA.Statins. No chest pain or SOB  Hx of Kidney Stones -as above  Disposition: Remain inpatient  DVT Prophylaxis: Prophylactic  Heparin   Code Status: Full code  Family Communication Spouse at discharge  Procedures:  None  CONSULTS:  Neurosurgery  Time spent 40 minutes-which includes 50% of the time with face-to-face with patient/ family and coordinating care related to the above assessment and plan.    MEDICATIONS: Scheduled Meds: . aspirin EC  81 mg Oral Daily  . atorvastatin  20 mg Oral q1800  . ciprofloxacin  400 mg Intravenous BID  . heparin  5,000 Units Subcutaneous 3 times per day   Continuous Infusions: . sodium chloride 100 mL/hr at 07/31/13 0604   PRN Meds:.diazepam, HYDROmorphone (DILAUDID) injection, ketorolac, ondansetron, oxyCODONE  Antibiotics: Anti-infectives   Start     Dose/Rate Route Frequency Ordered Stop   07/31/13 0145  ciprofloxacin (CIPRO) IVPB 400 mg     400 mg 200 mL/hr over 60 Minutes Intravenous 2 times daily 07/31/13 0138         PHYSICAL EXAM: Vital signs in  last 24 hours: Filed Vitals:   07/30/13 1815 07/30/13 1945 07/30/13 2130 07/31/13 0518  BP: 112/70 121/85 114/75 108/74  Pulse: 82 88 74 85  Temp:   97.6 F (36.4 C) 97.7 F (36.5 C)  TempSrc:   Oral Oral  Resp: 17 19 17 18   Height:   5\' 11"  (1.803 m)   Weight:   88.043 kg (194 lb 1.6 oz)   SpO2: 94% 92% 94% 96%    Weight change:  Filed Weights   07/30/13 2130  Weight: 88.043 kg (194 lb 1.6 oz)   Body mass index is 27.08 kg/(m^2).   Gen Exam: Awake and alert with clear speech.   Neck: Supple, No JVD.   Chest: B/L Clear.   CVS: S1 S2 Regular, no murmurs.  Abdomen: soft, BS +, non tender, non distended.  Extremities: no edema, lower extremities warm to touch. Neurologic: Non Focal.   Skin: No Rash.   Wounds: N/A.   Intake/Output from previous day:  Intake/Output Summary (Last 24 hours) at 07/31/13 0936 Last data filed at 07/31/13 0830  Gross per 24 hour  Intake      0 ml  Output    100 ml  Net   -100 ml     LAB RESULTS: CBC  Recent Labs Lab 07/30/13 1759 07/30/13 1807 07/30/13 2010 07/31/13 0625  WBC 9.6  --  10.4 10.3  HGB 17.9* 18.0* 17.5* 17.0  HCT 50.7 53.0* 49.4 48.4  PLT 199  --  213 201  MCV 87.9  --  86.7 87.2  MCH 31.0  --  30.7  30.6  MCHC 35.3  --  35.4 35.1  RDW 13.0  --  13.0 12.8  LYMPHSABS  --   --   --  1.4  MONOABS  --   --   --  0.2  EOSABS  --   --   --  0.0  BASOSABS  --   --   --  0.0    Chemistries   Recent Labs Lab 07/30/13 1807 07/30/13 2010 07/31/13 0625  NA 137  --  138  K 4.3  --  5.2  CL 100  --  101  CO2  --   --  26  GLUCOSE 129*  --  165*  BUN 21  --  20  CREATININE 1.00 0.93 0.96  CALCIUM  --   --  8.8    CBG: No results found for this basename: GLUCAP,  in the last 168 hours  GFR Estimated Creatinine Clearance: 75.2 ml/min (by C-G formula based on Cr of 0.96).  Coagulation profile No results found for this basename: INR, PROTIME,  in the last 168 hours  Cardiac Enzymes No results found for this  basename: CK, CKMB, TROPONINI, MYOGLOBIN,  in the last 168 hours  No components found with this basename: POCBNP,  No results found for this basename: DDIMER,  in the last 72 hours No results found for this basename: HGBA1C,  in the last 72 hours No results found for this basename: CHOL, HDL, LDLCALC, TRIG, CHOLHDL, LDLDIRECT,  in the last 72 hours No results found for this basename: TSH, T4TOTAL, FREET3, T3FREE, THYROIDAB,  in the last 72 hours No results found for this basename: VITAMINB12, FOLATE, FERRITIN, TIBC, IRON, RETICCTPCT,  in the last 72 hours No results found for this basename: LIPASE, AMYLASE,  in the last 72 hours  Urine Studies No results found for this basename: UACOL, UAPR, USPG, UPH, UTP, UGL, UKET, UBIL, UHGB, UNIT, UROB, ULEU, UEPI, UWBC, URBC, UBAC, CAST, CRYS, UCOM, BILUA,  in the last 72 hours  MICROBIOLOGY: No results found for this or any previous visit (from the past 240 hour(s)).  RADIOLOGY STUDIES/RESULTS: Mr Lumbar Spine Wo Contrast  07/23/2013   CLINICAL DATA:  Chronic low back pain.  Left-sided symptoms.  EXAM: MRI LUMBAR SPINE WITHOUT CONTRAST  TECHNIQUE: Multiplanar, multisequence MR imaging of the lumbar spine was performed. No intravenous contrast was administered.  COMPARISON:  MRI lumbar spine 09/20/2012.  FINDINGS: Normal signal is present in the conus medullaris which terminates at L1. Bilateral L5 pars defects are again noted. 9 mm anterolisthesis at L5-S1 is unchanged. Slight retrolisthesis at each level from T12-L1 through L4-5 is stable. Endplate marrow changes are similar to the prior study. Rightward curvature of the upper lumbar spine is centered at L1. Leftward curvature of the lower lumbar spine is centered at L4. Limited imaging of the abdomen is unremarkable.  T12-L1: A leftward disc protrusion is present. Mild facet hypertrophy is worse on the left. Mild left foraminal narrowing is stable.  L1-2: A leftward disc protrusion is present. Mild facet  hypertrophy is noted. Mild left lateral recess and foraminal narrowing is slightly worse.  L2-3: A leftward disc protrusion is present. Mild facet hypertrophy is noted bilaterally. Mild left lateral recess and bilateral foraminal narrowing is stable.  L3-4: A rightward disc protrusion is present. Asymmetric right-sided facet hypertrophy is noted. Mild right lateral recess and moderate right foraminal stenosis is stable.  L4-5: A broad-based disc protrusion is present. Mild facet hypertrophy is noted bilaterally. Mild  foraminal narrowing bilaterally is unchanged.  L5-S1: Pars defects are present. There is uncovering of a broad-based disc protrusion. Moderate right lateral recess and foraminal stenosis is stable. Mild left foraminal narrowing is stable.  IMPRESSION: 1. S shaped scoliosis of the lumbar spine without significant interval change. 2. 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 3. Slight progression of mild left lateral recess and foraminal narrowing at L1-2. 4. Spinal stenosis is not significantly changed through the remainder of the lumbar spine as described above.   Electronically Signed   By: Lawrence Santiago M.D.   On: 07/23/2013 17:29    Oren Binet, MD  [facility] Pager:[phone]  If 7PM-7AM, please contact night-coverage [email or web address] Password [email or web address] 07/31/2013, 9:36 AM   LOS: 1 day   **Disclaimer: This note may have been dictated with voice recognition software. Similar sounding words can inadvertently be transcribed and this note may contain transcription errors which may not have been corrected upon publication of note.**

## 2013-08-01 LAB — CBC WITH DIFFERENTIAL/PLATELET
BASOS PCT: 0 % (ref 0–1)
Basophils Absolute: 0 10*3/uL (ref 0.0–0.1)
EOS PCT: 1 % (ref 0–5)
Eosinophils Absolute: 0.1 10*3/uL (ref 0.0–0.7)
HEMATOCRIT: 44.9 % (ref 39.0–52.0)
Hemoglobin: 15.8 g/dL (ref 13.0–17.0)
Lymphocytes Relative: 32 % (ref 12–46)
Lymphs Abs: 3 10*3/uL (ref 0.7–4.0)
MCH: 30.3 pg (ref 26.0–34.0)
MCHC: 35.2 g/dL (ref 30.0–36.0)
MCV: 86 fL (ref 78.0–100.0)
MONO ABS: 0.9 10*3/uL (ref 0.1–1.0)
Monocytes Relative: 10 % (ref 3–12)
Neutro Abs: 5.5 10*3/uL (ref 1.7–7.7)
Neutrophils Relative %: 57 % (ref 43–77)
Platelets: 175 10*3/uL (ref 150–400)
RBC: 5.22 MIL/uL (ref 4.22–5.81)
RDW: 12.8 % (ref 11.5–15.5)
WBC: 9.5 10*3/uL (ref 4.0–10.5)

## 2013-08-01 LAB — COMPREHENSIVE METABOLIC PANEL
ALBUMIN: 3.1 g/dL — AB (ref 3.5–5.2)
ALT: 51 U/L (ref 0–53)
AST: 22 U/L (ref 0–37)
Alkaline Phosphatase: 60 U/L (ref 39–117)
BUN: 25 mg/dL — ABNORMAL HIGH (ref 6–23)
CALCIUM: 8.6 mg/dL (ref 8.4–10.5)
CO2: 25 meq/L (ref 19–32)
CREATININE: 1.07 mg/dL (ref 0.50–1.35)
Chloride: 98 mEq/L (ref 96–112)
GFR calc Af Amer: 79 mL/min — ABNORMAL LOW (ref >90)
GFR, EST NON AFRICAN AMERICAN: 68 mL/min — AB (ref >90)
Glucose, Bld: 144 mg/dL — ABNORMAL HIGH (ref 70–99)
Potassium: 4.4 mEq/L (ref 3.7–5.3)
Sodium: 134 mEq/L — ABNORMAL LOW (ref 137–147)
Total Bilirubin: 0.4 mg/dL (ref 0.3–1.2)
Total Protein: 5.8 g/dL — ABNORMAL LOW (ref 6.0–8.3)

## 2013-08-01 MED ORDER — OXYCODONE-ACETAMINOPHEN 5-325 MG PO TABS: 1.0000 | ORAL_TABLET | ORAL | Status: DC | PRN

## 2013-08-01 NOTE — ED Provider Notes (Signed)
I saw and evaluated the patient, reviewed the resident's note and I agree with the findings and plan.   EKG Interpretation None       Pt comes in with back pain. Followed by Nsurgery. Pain is intractable and not responding to oral meds. Nsurgery called [place], and requested admission. They are not concerned for cord compression. Med to admit.  Varney Biles, MD 08/01/13 [phone]

## 2013-08-01 NOTE — Progress Notes (Signed)
Physical Therapy Discharge Patient Details Name: THOREN HOSANG MRN: [id number] DOB: [birth year] Today's Date: 08/01/2013 Time:  -     Patient discharged from PT services secondary to pt and wife report that pt got up and mobilized well enough that they feel a PT evaluation is unwarranted at this time.  Will sign off..  08/01/2013  Donnella Sham, [place] [phone]  (pager)  GP     Iyana Topor, Tessie Fass 08/01/2013, 12:59 PM

## 2013-08-01 NOTE — Progress Notes (Signed)
Discharge instructions, follow up appointment, and medications were reviewed with pt and pt's wife. Education given by Manson Allan, RN. Pt ready for discharge when ride home arrives.

## 2013-08-01 NOTE — Discharge Instructions (Signed)
Follow with Primary MD Glo Herring., MD in 7 days   Get CBC, CMP  checked  by Primary MD next visit.    Activity: As tolerated with Full fall precautions use walker/cane & assistance as needed   Disposition Home     Diet: Heart Healthy   For Heart failure patients - Check your Weight same time everyday, if you gain over 2 pounds, or you develop in leg swelling, experience more shortness of breath or chest pain, call your Primary MD immediately. Follow Cardiac Low Salt Diet and 1.8 lit/day fluid restriction.   On your next visit with her primary care physician please Get Medicines reviewed and adjusted.  Please request your Prim.MD to go over all Hospital Tests and Procedure/Radiological results at the follow up, please get all Hospital records sent to your Prim MD by signing hospital release before you go home.   If you experience worsening of your admission symptoms, develop shortness of breath, life threatening emergency, suicidal or homicidal thoughts you must seek medical attention immediately by calling 911 or calling your MD immediately  if symptoms less severe.  You Must read complete instructions/literature along with all the possible adverse reactions/side effects for all the Medicines you take and that have been prescribed to you. Take any new Medicines after you have completely understood and accpet all the possible adverse reactions/side effects.   Do not drive, operating heavy machinery, perform activities at heights, swimming or participation in water activities or provide baby sitting services if your were admitted for syncope or siezures until you have seen by Primary MD or a Neurologist and advised to do so again.  Do not drive when taking Pain medications.    Do not take more than prescribed Pain, Sleep and Anxiety Medications  Special Instructions: If you have smoked or chewed Tobacco  in the last 2 yrs please stop smoking, stop any regular Alcohol  and or any  Recreational drug use.  Wear Seat belts while driving.   Please note  You were cared for by a hospitalist during your hospital stay. If you have any questions about your discharge medications or the care you received while you were in the hospital after you are discharged, you can call the unit and asked to speak with the hospitalist on call if the hospitalist that took care of you is not available. Once you are discharged, your primary care physician will handle any further medical issues. Please note that NO REFILLS for any discharge medications will be authorized once you are discharged, as it is imperative that you return to your primary care physician (or establish a relationship with a primary care physician if you do not have one) for your aftercare needs so that they can reassess your need for medications and monitor your lab values.

## 2013-08-01 NOTE — Discharge Summary (Signed)
Stephen Rowe, is a 73 y.o. male  DOB 21-Sep-1940  MRN [id number].  Admission date:  07/30/2013  Admitting Physician  Shanda Howells, MD  Discharge Date:  08/01/2013   Primary MD  Glo Herring., MD  Recommendations for primary care physician for things to follow:   Monitor back pain clinically he did must follow with recommended urologist and neurosurgeon on a close basis  Repeat 2 view chest x-ray in a week.   Admission Diagnosis  Back Pain   Discharge Diagnosis  Back Pain . nonobstructing renal stone  Active Problems:   Back pain      Past Medical History  Diagnosis Date  . CAD (coronary artery disease)     3 blockages, unchanged in past 15 years, s/p cardiac cath X 2 but no stents  . Arthritis   . Prostatitis   . Hyperlipidemia   . History of kidney stones   . GERD (gastroesophageal reflux disease)   . Skin cancer of face   . PONV (postoperative nausea and vomiting)   . 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 Kidney stone surgery    . Rotator cuff repair      right  . Cholecystectomy    . Knee surgery      right  . Hand surgery      right  . Skin cancer excision    . Colonoscopy  11/18/2011    Procedure: COLONOSCOPY;  Surgeon: Daneil Dolin, MD;  [facility]: [facility];  Service: Endoscopy;  Laterality: N/A;  8:45  . Joint replacement    . Knee surgery Right      Discharge Condition: stable   Follow UP  Follow-up Information   Follow up with hall, marshall c, MD. Schedule an appointment as soon as possible for a visit in 1 week.   Specialty:  Urology   Contact information:   [place] [address]       Follow up with Glo Herring., MD. Schedule an appointment as soon as possible for a visit in 1 week.   Specialty:   Internal Medicine   Contact information:   [address] [place] [address] [phone]       Follow up with CRAM,GARY P, MD. Schedule an appointment as soon as possible for a visit in 3 days.   Specialty:  Neurosurgery   Contact information:   [address] [name] [address] [phone]         Discharge Instructions  and  Discharge Medications         Discharge Instructions   Diet - low sodium heart healthy    Complete by:  As directed      Discharge instructions    Complete by:  As directed   Follow with Primary MD Glo Herring., MD in 7 days   Get CBC, CMP  checked  by Primary MD next visit.  Activity: As tolerated with Full fall precautions use walker/cane & assistance as needed   Disposition Home     Diet: Heart Healthy   For Heart failure patients - Check your Weight same time everyday, if you gain over 2 pounds, or you develop in leg swelling, experience more shortness of breath or chest pain, call your Primary MD immediately. Follow Cardiac Low Salt Diet and 1.8 lit/day fluid restriction.   On your next visit with her primary care physician please Get Medicines reviewed and adjusted.  Please request your Prim.MD to go over all Hospital Tests and Procedure/Radiological results at the follow up, please get all Hospital records sent to your Prim MD by signing hospital release before you go home.   If you experience worsening of your admission symptoms, develop shortness of breath, life threatening emergency, suicidal or homicidal thoughts you must seek medical attention immediately by calling 911 or calling your MD immediately  if symptoms less severe.  You Must read complete instructions/literature along with all the possible adverse reactions/side effects for all the Medicines you take and that have been prescribed to you. Take any new Medicines after you have completely understood and accpet all the possible adverse  reactions/side effects.   Do not drive, operating heavy machinery, perform activities at heights, swimming or participation in water activities or provide baby sitting services if your were admitted for syncope or siezures until you have seen by Primary MD or a Neurologist and advised to do so again.  Do not drive when taking Pain medications.    Do not take more than prescribed Pain, Sleep and Anxiety Medications  Special Instructions: If you have smoked or chewed Tobacco  in the last 2 yrs please stop smoking, stop any regular Alcohol  and or any Recreational drug use.  Wear Seat belts while driving.   Please note  You were cared for by a hospitalist during your hospital stay. If you have any questions about your discharge medications or the care you received while you were in the hospital after you are discharged, you can call the unit and asked to speak with the hospitalist on call if the hospitalist that took care of you is not available. Once you are discharged, your primary care physician will handle any further medical issues. Please note that NO REFILLS for any discharge medications will be authorized once you are discharged, as it is imperative that you return to your primary care physician (or establish a relationship with a primary care physician if you do not have one) for your aftercare needs so that they can reassess your need for medications and monitor your lab values.  Follow with Primary MD Glo Herring., MD in 7 days   Get CBC, CMP  checked  by Primary MD next visit.    Activity: As tolerated with Full fall precautions use walker/cane & assistance as needed   Disposition Home     Diet: Heart Healthy   For Heart failure patients - Check your Weight same time everyday, if you gain over 2 pounds, or you develop in leg swelling, experience more shortness of breath or chest pain, call your Primary MD immediately. Follow Cardiac Low Salt Diet and 1.8 lit/day fluid  restriction.   On your next visit with her primary care physician please Get Medicines reviewed and adjusted.  Please request your Prim.MD to go over all Hospital Tests and Procedure/Radiological results at the follow up, please get all Hospital records sent to your Prim MD  by signing hospital release before you go home.   If you experience worsening of your admission symptoms, develop shortness of breath, life threatening emergency, suicidal or homicidal thoughts you must seek medical attention immediately by calling 911 or calling your MD immediately  if symptoms less severe.  You Must read complete instructions/literature along with all the possible adverse reactions/side effects for all the Medicines you take and that have been prescribed to you. Take any new Medicines after you have completely understood and accpet all the possible adverse reactions/side effects.   Do not drive, operating heavy machinery, perform activities at heights, swimming or participation in water activities or provide baby sitting services if your were admitted for syncope or siezures until you have seen by Primary MD or a Neurologist and advised to do so again.  Do not drive when taking Pain medications.    Do not take more than prescribed Pain, Sleep and Anxiety Medications  Special Instructions: If you have smoked or chewed Tobacco  in the last 2 yrs please stop smoking, stop any regular Alcohol  and or any Recreational drug use.  Wear Seat belts while driving.   Please note  You were cared for by a hospitalist during your hospital stay. If you have any questions about your discharge medications or the care you received while you were in the hospital after you are discharged, you can call the unit and asked to speak with the hospitalist on call if the hospitalist that took care of you is not available. Once you are discharged, your primary care physician will handle any further medical issues. Please note  that NO REFILLS for any discharge medications will be authorized once you are discharged, as it is imperative that you return to your primary care physician (or establish a relationship with a primary care physician if you do not have one) for your aftercare needs so that they can reassess your need for medications and monitor your lab values.     Increase activity slowly    Complete by:  As directed             Medication List    STOP taking these medications       HYDROCODONE-ACETAMINOPHEN PO      TAKE these medications       aspirin EC 81 MG tablet  Take 81 mg by mouth daily.     diazepam 10 MG tablet  Commonly known as:  VALIUM  Take 10 mg by mouth every 6 (six) hours as needed (Muscle spasm).     OVER THE COUNTER MEDICATION  Take 1 tablet by mouth daily as needed (stomach acid). Over the counter tablet for stomach acid     oxyCODONE-acetaminophen 5-325 MG per tablet  Commonly known as:  PERCOCET/ROXICET  Take 1 tablet by mouth every 4 (four) hours as needed for severe pain.     predniSONE 10 MG tablet  Commonly known as:  DELTASONE  Take 10 mg by mouth daily with breakfast. Take 6 tablets on day 1, 5 tablets on day 2, 4 tablets on day 3, 3 tablets on day 4, 2 tablets on day 5, 1 tablet on day 6, then d/c     rosuvastatin 10 MG tablet  Commonly known as:  CRESTOR  Take 10 mg by mouth daily.          Diet and Activity recommendation: See Discharge Instructions above   Consults obtained - N Surg   Major procedures and Radiology Reports -  PLEASE review detailed and final reports for all details, in brief -      Ct Abdomen Pelvis Wo Contrast  07/31/2013   CLINICAL DATA:  Left back pain radiating into the left groin. History of nephrolithiasis.  EXAM: CT ABDOMEN AND PELVIS WITHOUT CONTRAST  TECHNIQUE: Multidetector CT imaging of the abdomen and pelvis was performed following the standard protocol without IV contrast.  COMPARISON:  Abdominal ultrasound  05/29/2013. Chest CT 10/16/2009 and abdominal CT 10/01/2009.  FINDINGS: The new subpleural densities in both lung bases are most consistent with subpleural scarring. Right middle lobe nodularity appears unchanged. There is no significant pleural effusion.  There are nonobstructing calculi in the lower pole of the left kidney. No right renal calculus, hydronephrosis or ureteral calculus is demonstrated. Both kidneys otherwise appear unremarkable.  The gallbladder is surgically absent. As evaluated in the noncontrast state, the liver, spleen, pancreas and adrenal glands appear unremarkable.  The stomach, small bowel, appendix and colon appear unremarkable aside from sigmoid diverticulosis. There is stable aortoiliac atherosclerosis. No inflammatory changes or enlarged lymph nodes are identified. The prostate gland is mildly enlarged with central low density on image number 90, nonspecific. The bladder, prostate gland and seminal vesicles otherwise appear unchanged. There is stable prominent fat within both inguinal canals.  Lumbar spine degenerative changes, a scoliosis and bilateral L5 pars defects are noted. There is a resulting grade 1 anterolisthesis and biforaminal stenosis at L5-S1.  IMPRESSION: 1. Nonobstructing left renal calculi. No evidence of ureteral calculus or hydronephrosis. 2. No acute abdominal findings identified. 3. New subpleural opacities dependently in both lower lobes, most likely postinflammatory scarring. 4. Sigmoid diverticulosis, central low density within the prostate gland and bilateral L5 pars defects noted.   Electronically Signed   By: Camie Patience M.D.   On: 07/31/2013 13:48   Mr Lumbar Spine Wo Contrast  07/23/2013   CLINICAL DATA:  Chronic low back pain.  Left-sided symptoms.  EXAM: MRI LUMBAR SPINE WITHOUT CONTRAST  TECHNIQUE: Multiplanar, multisequence MR imaging of the lumbar spine was performed. No intravenous contrast was administered.  COMPARISON:  MRI lumbar spine  09/20/2012.  FINDINGS: Normal signal is present in the conus medullaris which terminates at L1. Bilateral L5 pars defects are again noted. 9 mm anterolisthesis at L5-S1 is unchanged. Slight retrolisthesis at each level from T12-L1 through L4-5 is stable. Endplate marrow changes are similar to the prior study. Rightward curvature of the upper lumbar spine is centered at L1. Leftward curvature of the lower lumbar spine is centered at L4. Limited imaging of the abdomen is unremarkable.  T12-L1: A leftward disc protrusion is present. Mild facet hypertrophy is worse on the left. Mild left foraminal narrowing is stable.  L1-2: A leftward disc protrusion is present. Mild facet hypertrophy is noted. Mild left lateral recess and foraminal narrowing is slightly worse.  L2-3: A leftward disc protrusion is present. Mild facet hypertrophy is noted bilaterally. Mild left lateral recess and bilateral foraminal narrowing is stable.  L3-4: A rightward disc protrusion is present. Asymmetric right-sided facet hypertrophy is noted. Mild right lateral recess and moderate right foraminal stenosis is stable.  L4-5: A broad-based disc protrusion is present. Mild facet hypertrophy is noted bilaterally. Mild foraminal narrowing bilaterally is unchanged.  L5-S1: Pars defects are present. There is uncovering of a broad-based disc protrusion. Moderate right lateral recess and foraminal stenosis is stable. Mild left foraminal narrowing is stable.  IMPRESSION: 1. S shaped scoliosis of the lumbar spine without significant interval change. 2. Bilateral  L5 pars defects and sequelae are stable. 3. Slight progression of mild left lateral recess and foraminal narrowing at L1-2. 4. Spinal stenosis is not significantly changed through the remainder of the lumbar spine as described above.   Electronically Signed   By: Lawrence Santiago M.D.   On: 07/23/2013 17:29    Micro Results     Recent Results (from the past 240 hour(s))  URINE CULTURE      Status: None   Collection Time    07/30/13  7:49 PM      Result Value Ref Range Status   Specimen Description URINE, CLEAN CATCH   Final   Special Requests NONE   Final   Culture  Setup Time     Final   Value: 07/30/2013 20:46     Performed at [facility]     Final   Value: NO GROWTH     Performed at [facility]   Culture     Final   Value: NO GROWTH     Performed at [facility]   Report Status 07/31/2013 FINAL   Final     History of present illness and  Hospital Course:     Kindly see H&P for history of present illness and admission details, please review complete Labs, Consult reports and Test reports for all details in brief   History of Present Illness:This is a 73 y.o. year old male with prior hx/o L4-L5 bulging disc, CAD, prostatic nodule and nephrolithiasis presenting with back pain. Family states that pt has been seen by neurosurgery for low back pain. Has been present for approx 2 years. Has had progressive worsening of LBP over past 2 weeks. Pt/family deny any recent strenuous activity. No subjective fevers or chills. Was seen in ER for sxs last week. Had MRI done that showed scoliosis, bilateral L5 pars defect, L1-L2 foraminal narrowing and unchanged spinal stenosis. Pt was given prednisone dose pack, percocet and valium. Pain persisted through week. Followed up with NS on Friday of last week. Plan for for ? ESI this week. Family was also offered inpt admission for pain management. Family deferred. Pain has worsened over the weekend. Was also seen at [facility] for back [facility]. He was admitted for management of acute on chronic back pain.   Hospital Course    Intractable back pain. No history of multilevel L-spine spondylosis was being followed by Dr. Kary Kos neurosurgery, was admitted for pain control, seen by neurosurgeon Dr. Nena Polio , with supportive care pain much better, there was a question of renal stone becoming  obstructive which was ruled out by CT scan abdomen pelvis. I discussed the case with neurosurgeon Dr.Neelesh who has cleared the patient for home discharge with supportive care as he is remarkably better, patient now says that his back pain is back to his baseline, does not report any lower extremity weakness, bowel or bladder incontinence et Ronney Asters. He will follow with neurosurgery in their office on a close basis for management of his chronic back pain.   Nonobstructive renal stone and possible BPH on CT scan abdomen pelvis. He will follow with his personal urologist at [facility].   History of CAD and dyslipidemia continue home medications unchanged.   Nonspecific finding on CT scan of lung scarring. Repeat 2 view chest x-ray in a week by PCP.     Today   Subjective:   Stephen Rowe today has no headache,no chest abdominal pain,no new  weakness tingling or numbness, feels much better wants to go home today.    Objective:   Blood pressure 105/64, pulse 78, temperature 97.3 F (36.3 C), temperature source Oral, resp. rate 16, height 5\' 11"  (1.803 m), weight 88.043 kg (194 lb 1.6 oz), SpO2 98.00%.   Intake/Output Summary (Last 24 hours) at 08/01/13 1310 Last data filed at 08/01/13 0600  Gross per 24 hour  Intake      0 ml  Output    350 ml  Net   -350 ml    Exam Awake Alert, Oriented x 3, No new F.N deficits, Normal affect [place].AT,PERRAL Supple Neck,No JVD, No cervical lymphadenopathy appriciated.  Symmetrical Chest wall movement, Good air movement bilaterally, CTAB RRR,No Gallops,Rubs or new Murmurs, No Parasternal Heave +ve B.Sounds, Abd Soft, Non tender, No organomegaly appriciated, No rebound -guarding or rigidity. No Cyanosis, Clubbing or edema, No new Rash or bruise  Data Review   CBC w Diff: Lab Results  Component Value Date   WBC 9.5 08/01/2013   HGB 15.8 08/01/2013   HCT 44.9 08/01/2013   PLT 175 08/01/2013   LYMPHOPCT 32 08/01/2013   MONOPCT 10 08/01/2013   EOSPCT 1  08/01/2013   BASOPCT 0 08/01/2013    CMP: Lab Results  Component Value Date   NA 134* 08/01/2013   K 4.4 08/01/2013   CL 98 08/01/2013   CO2 25 08/01/2013   BUN 25* 08/01/2013   CREATININE 1.07 08/01/2013   PROT 5.8* 08/01/2013   ALBUMIN 3.1* 08/01/2013   BILITOT 0.4 08/01/2013   ALKPHOS 60 08/01/2013   AST 22 08/01/2013   ALT 51 08/01/2013  .   Total Time in preparing paper work, data evaluation and todays exam - 35 minutes  Thurnell Lose M.D on 08/01/2013 at 1:10 PM  [facility] Office  [phone]   **Disclaimer: This note may have been dictated with voice recognition software. Similar sounding words can inadvertently be transcribed and this note may contain transcription errors which may not have been corrected upon publication of note.**

## 2013-08-08 ENCOUNTER — Ambulatory Visit
Admission: RE | Admit: 2013-08-08 | Discharge: 2013-08-08 | Disposition: A | Discharge status: Home / Self Care | Payer: Medicare Other | Source: Ambulatory Visit | Attending: Neurosurgery | Admitting: Neurosurgery

## 2013-08-08 VITALS — BP 135/81 | HR 80

## 2013-08-08 DIAGNOSIS — M545 Low back pain, unspecified: Secondary | ICD-10-CM

## 2013-08-08 DIAGNOSIS — M47816 Spondylosis without myelopathy or radiculopathy, lumbar region: Secondary | ICD-10-CM

## 2013-08-08 MED ORDER — METHYLPREDNISOLONE ACETATE 40 MG/ML INJ SUSP (RADIOLOG
120.0000 mg | Freq: Once | INTRAMUSCULAR | Status: AC
  Administered 2013-08-08: 120 mg via INTRA_ARTICULAR

## 2013-08-08 MED ORDER — IOHEXOL 180 MG/ML  SOLN
1.0000 mL | Freq: Once | INTRAMUSCULAR | Status: AC | PRN
  Administered 2013-08-08: 1 mL via INTRA_ARTICULAR

## 2013-08-08 NOTE — Discharge Instructions (Signed)

## 2013-11-30 DIAGNOSIS — R351 Nocturia: Secondary | ICD-10-CM | POA: Insufficient documentation

## 2013-11-30 DIAGNOSIS — I2510 Atherosclerotic heart disease of native coronary artery without angina pectoris: Secondary | ICD-10-CM | POA: Insufficient documentation

## 2013-11-30 DIAGNOSIS — N402 Nodular prostate without lower urinary tract symptoms: Secondary | ICD-10-CM | POA: Insufficient documentation

## 2013-11-30 DIAGNOSIS — K219 Gastro-esophageal reflux disease without esophagitis: Secondary | ICD-10-CM | POA: Insufficient documentation

## 2013-11-30 DIAGNOSIS — E785 Hyperlipidemia, unspecified: Secondary | ICD-10-CM | POA: Insufficient documentation

## 2013-11-30 DIAGNOSIS — R6882 Decreased libido: Secondary | ICD-10-CM | POA: Insufficient documentation

## 2013-11-30 DIAGNOSIS — N200 Calculus of kidney: Secondary | ICD-10-CM | POA: Insufficient documentation

## 2014-03-12 DIAGNOSIS — E781 Pure hyperglyceridemia: Secondary | ICD-10-CM | POA: Diagnosis not present

## 2014-03-12 DIAGNOSIS — I10 Essential (primary) hypertension: Secondary | ICD-10-CM | POA: Diagnosis not present

## 2014-03-12 DIAGNOSIS — M1990 Unspecified osteoarthritis, unspecified site: Secondary | ICD-10-CM | POA: Diagnosis not present

## 2014-03-12 DIAGNOSIS — K219 Gastro-esophageal reflux disease without esophagitis: Secondary | ICD-10-CM | POA: Diagnosis not present

## 2014-03-12 DIAGNOSIS — N390 Urinary tract infection, site not specified: Secondary | ICD-10-CM | POA: Diagnosis not present

## 2014-03-12 DIAGNOSIS — Z6829 Body mass index (BMI) 29.0-29.9, adult: Secondary | ICD-10-CM | POA: Diagnosis not present

## 2014-03-12 DIAGNOSIS — N400 Enlarged prostate without lower urinary tract symptoms: Secondary | ICD-10-CM | POA: Diagnosis not present

## 2014-05-28 DIAGNOSIS — M4120 Other idiopathic scoliosis, site unspecified: Secondary | ICD-10-CM | POA: Diagnosis not present

## 2014-05-28 DIAGNOSIS — Z6830 Body mass index (BMI) 30.0-30.9, adult: Secondary | ICD-10-CM | POA: Diagnosis not present

## 2014-05-28 DIAGNOSIS — M4310 Spondylolisthesis, site unspecified: Secondary | ICD-10-CM | POA: Diagnosis not present

## 2014-06-03 ENCOUNTER — Other Ambulatory Visit: Payer: Self-pay | Admitting: Neurosurgery

## 2014-06-03 DIAGNOSIS — M4120 Other idiopathic scoliosis, site unspecified: Secondary | ICD-10-CM

## 2014-06-06 ENCOUNTER — Ambulatory Visit
Admission: RE | Admit: 2014-06-06 | Discharge: 2014-06-06 | Disposition: A | Discharge status: Home / Self Care | Payer: Medicare Other | Source: Ambulatory Visit | Attending: Neurosurgery | Admitting: Neurosurgery

## 2014-06-06 DIAGNOSIS — M4120 Other idiopathic scoliosis, site unspecified: Secondary | ICD-10-CM

## 2014-06-06 DIAGNOSIS — M4125 Other idiopathic scoliosis, thoracolumbar region: Secondary | ICD-10-CM | POA: Diagnosis not present

## 2014-06-06 DIAGNOSIS — M47814 Spondylosis without myelopathy or radiculopathy, thoracic region: Secondary | ICD-10-CM | POA: Diagnosis not present

## 2014-06-06 DIAGNOSIS — M4317 Spondylolisthesis, lumbosacral region: Secondary | ICD-10-CM | POA: Diagnosis not present

## 2014-06-11 DIAGNOSIS — M5126 Other intervertebral disc displacement, lumbar region: Secondary | ICD-10-CM | POA: Diagnosis not present

## 2014-06-11 DIAGNOSIS — Z6830 Body mass index (BMI) 30.0-30.9, adult: Secondary | ICD-10-CM | POA: Diagnosis not present

## 2014-06-11 DIAGNOSIS — M4120 Other idiopathic scoliosis, site unspecified: Secondary | ICD-10-CM | POA: Diagnosis not present

## 2014-06-11 DIAGNOSIS — M4310 Spondylolisthesis, site unspecified: Secondary | ICD-10-CM | POA: Diagnosis not present

## 2014-06-13 ENCOUNTER — Other Ambulatory Visit: Payer: Self-pay | Admitting: Neurosurgery

## 2014-06-19 DIAGNOSIS — E785 Hyperlipidemia, unspecified: Secondary | ICD-10-CM | POA: Diagnosis not present

## 2014-06-19 DIAGNOSIS — I2510 Atherosclerotic heart disease of native coronary artery without angina pectoris: Secondary | ICD-10-CM | POA: Diagnosis not present

## 2014-06-19 DIAGNOSIS — Z01810 Encounter for preprocedural cardiovascular examination: Secondary | ICD-10-CM | POA: Diagnosis not present

## 2014-06-19 NOTE — Pre-Procedure Instructions (Signed)
Stephen Rowe  06/19/2014   Your procedure is scheduled on:  Friday Jun 21, 2014 at 10:30 AM.  Report to [facility] Admitting at 8:30 AM.  Call this number if you have problems the morning of surgery: [phone]   Remember:   Do not eat food or drink liquids after midnight.   Take these medicines the morning of surgery with A SIP OF WATER: Diazepam (Valium) if needed   Do not wear jewelry.  Do not wear lotions, powders, or cologne.   Men may shave face and neck.  Do not bring valuables to the hospital.  [facility] is not responsible for any belongings or valuables.               Contacts, dentures or bridgework may not be worn into surgery.  Leave suitcase in the car. After surgery it may be brought to your room.  For patients admitted to the hospital, discharge time is determined by your treatment team.               Patients discharged the day of surgery will not be allowed to drive home.  Name and phone number of your driver:   Special Instructions: Shower using CHG soap the night before and the morning of your surgery   Please read over the following fact sheets that you were given: Pain Booklet, Coughing and Deep Breathing, Blood Transfusion Information, MRSA Information and Surgical Site Infection Prevention

## 2014-06-20 ENCOUNTER — Encounter (HOSPITAL_COMMUNITY): Payer: Self-pay

## 2014-06-20 ENCOUNTER — Encounter (HOSPITAL_COMMUNITY)
Admission: RE | Admit: 2014-06-20 | Discharge: 2014-06-20 | Disposition: A | Discharge status: Home / Self Care | Payer: Medicare Other | Source: Ambulatory Visit | Attending: Neurosurgery | Admitting: Neurosurgery

## 2014-06-20 DIAGNOSIS — M419 Scoliosis, unspecified: Secondary | ICD-10-CM | POA: Insufficient documentation

## 2014-06-20 DIAGNOSIS — M5416 Radiculopathy, lumbar region: Secondary | ICD-10-CM | POA: Diagnosis not present

## 2014-06-20 DIAGNOSIS — Z0183 Encounter for blood typing: Secondary | ICD-10-CM

## 2014-06-20 DIAGNOSIS — Z87442 Personal history of urinary calculi: Secondary | ICD-10-CM | POA: Diagnosis not present

## 2014-06-20 DIAGNOSIS — Z01812 Encounter for preprocedural laboratory examination: Secondary | ICD-10-CM

## 2014-06-20 DIAGNOSIS — I959 Hypotension, unspecified: Secondary | ICD-10-CM | POA: Diagnosis not present

## 2014-06-20 DIAGNOSIS — I2510 Atherosclerotic heart disease of native coronary artery without angina pectoris: Secondary | ICD-10-CM | POA: Insufficient documentation

## 2014-06-20 DIAGNOSIS — Z01818 Encounter for other preprocedural examination: Secondary | ICD-10-CM | POA: Insufficient documentation

## 2014-06-20 DIAGNOSIS — N402 Nodular prostate without lower urinary tract symptoms: Secondary | ICD-10-CM | POA: Diagnosis not present

## 2014-06-20 DIAGNOSIS — M4317 Spondylolisthesis, lumbosacral region: Secondary | ICD-10-CM | POA: Diagnosis not present

## 2014-06-20 DIAGNOSIS — Z9049 Acquired absence of other specified parts of digestive tract: Secondary | ICD-10-CM | POA: Diagnosis not present

## 2014-06-20 DIAGNOSIS — J95821 Acute postprocedural respiratory failure: Secondary | ICD-10-CM | POA: Diagnosis not present

## 2014-06-20 DIAGNOSIS — Z87891 Personal history of nicotine dependence: Secondary | ICD-10-CM | POA: Insufficient documentation

## 2014-06-20 DIAGNOSIS — M4126 Other idiopathic scoliosis, lumbar region: Secondary | ICD-10-CM | POA: Diagnosis not present

## 2014-06-20 DIAGNOSIS — Z96651 Presence of right artificial knee joint: Secondary | ICD-10-CM | POA: Diagnosis not present

## 2014-06-20 DIAGNOSIS — Z85828 Personal history of other malignant neoplasm of skin: Secondary | ICD-10-CM | POA: Diagnosis not present

## 2014-06-20 DIAGNOSIS — I493 Ventricular premature depolarization: Secondary | ICD-10-CM | POA: Diagnosis not present

## 2014-06-20 DIAGNOSIS — T380X5A Adverse effect of glucocorticoids and synthetic analogues, initial encounter: Secondary | ICD-10-CM | POA: Diagnosis not present

## 2014-06-20 DIAGNOSIS — Z79899 Other long term (current) drug therapy: Secondary | ICD-10-CM

## 2014-06-20 DIAGNOSIS — K219 Gastro-esophageal reflux disease without esophagitis: Secondary | ICD-10-CM

## 2014-06-20 DIAGNOSIS — Z7982 Long term (current) use of aspirin: Secondary | ICD-10-CM

## 2014-06-20 DIAGNOSIS — E785 Hyperlipidemia, unspecified: Secondary | ICD-10-CM | POA: Insufficient documentation

## 2014-06-20 DIAGNOSIS — E875 Hyperkalemia: Secondary | ICD-10-CM | POA: Diagnosis not present

## 2014-06-20 DIAGNOSIS — M4807 Spinal stenosis, lumbosacral region: Secondary | ICD-10-CM | POA: Diagnosis not present

## 2014-06-20 DIAGNOSIS — R739 Hyperglycemia, unspecified: Secondary | ICD-10-CM | POA: Diagnosis not present

## 2014-06-20 DIAGNOSIS — K567 Ileus, unspecified: Secondary | ICD-10-CM | POA: Diagnosis not present

## 2014-06-20 HISTORY — DX: Nodular prostate without lower urinary tract symptoms: N40.2

## 2014-06-20 LAB — BASIC METABOLIC PANEL
ANION GAP: 8 (ref 5–15)
BUN: 14 mg/dL (ref 6–20)
CO2: 24 mmol/L (ref 22–32)
CREATININE: 0.92 mg/dL (ref 0.61–1.24)
Calcium: 9.4 mg/dL (ref 8.9–10.3)
Chloride: 105 mmol/L (ref 101–111)
GFR calc Af Amer: >60 mL/min (ref >60)
GFR calc non Af Amer: >60 mL/min (ref >60)
GLUCOSE: 123 mg/dL — AB (ref 65–99)
Potassium: 4.2 mmol/L (ref 3.5–5.1)
Sodium: 137 mmol/L (ref 135–145)

## 2014-06-20 LAB — CBC
HEMATOCRIT: 47.8 % (ref 39.0–52.0)
Hemoglobin: 16.5 g/dL (ref 13.0–17.0)
MCH: 29.9 pg (ref 26.0–34.0)
MCHC: 34.5 g/dL (ref 30.0–36.0)
MCV: 86.8 fL (ref 78.0–100.0)
Platelets: 180 10*3/uL (ref 150–400)
RBC: 5.51 MIL/uL (ref 4.22–5.81)
RDW: 12.7 % (ref 11.5–15.5)
WBC: 6 10*3/uL (ref 4.0–10.5)

## 2014-06-20 LAB — SURGICAL PCR SCREEN
MRSA, PCR: NEGATIVE
Staphylococcus aureus: NEGATIVE

## 2014-06-20 LAB — TYPE AND SCREEN
ABO/RH(D): O POS
ANTIBODY SCREEN: NEGATIVE

## 2014-06-20 LAB — ABO/RH: ABO/RH(D): O POS

## 2014-06-20 NOTE — Progress Notes (Addendum)
Anesthesia Chart Review: Patient is a 74 year old male scheduled for PLIF L4-5, L5-S1, XLIF L2-3, L3-4, posterior fusion T10-S1 tomorrow by Dr. Saintclair Halsted.  History includes former smoker, post-operative N/V, CAD (non-obstructive), HLD, GERD, skin cancer. PCP is Dr. Redmond School. Cardiologist is Dr. Loni Beckwith with [facility].  He was seen on 06/19/14 by Benito Mccreedy, NP-C/Dr. Elonda Husky for follow-up and pre-operative evaluation.  He was felt to be low CV risk. Her note states, "He has nonobstructive coronary artery disease by cardiac catheterization in 2004 in his LAD which was unchanged from previous cardiac catheterization in 1998." He reports that his last stress test was > 5 years ago.  Meds include ASA, Crestor, Valium.  06/19/14 EKG [facility]): NSR, incomplete right BBB.   01/17/03 Cardiac cath [facility]): Proximal LAD 50%, ostial LM 15%, proximal OM1 20%. Normal LV systolic function. Rec: Medical therapy (Dr. Elonda Husky).  Preoperative labs noted.   He has had recent cardiology evaluation with surgical clearance. If no acute changes then I would anticipate that she could proceed as planned.  [facility] Short Stay Center/Anesthesiology Phone [phone] 06/20/2014 2:23 PM

## 2014-06-20 NOTE — Progress Notes (Signed)
PCP is Redmond School, Cardiologist is Loni Beckwith at [facility] in [place] ([place] was 06/19/14), and Urologist is Dr. Nevada Crane at [place]. Patient denied having any acute cardiac or pulmonary issues but did inform Nurse that he had two cardiac caths (no PCI), and a few stress tests, but none in the past five years.   Clearance note on chart. Will send chart to [place], [place] for review.

## 2014-06-21 ENCOUNTER — Inpatient Hospital Stay (HOSPITAL_COMMUNITY)
Admission: RE | Admit: 2014-06-21 | Discharge: 2014-07-03 | DRG: 456 | Disposition: A | Discharge status: Home Health | Payer: Medicare Other | Source: Ambulatory Visit | Attending: Neurosurgery | Admitting: Neurosurgery

## 2014-06-21 ENCOUNTER — Encounter (HOSPITAL_COMMUNITY): Payer: Self-pay | Admitting: Anesthesiology

## 2014-06-21 ENCOUNTER — Encounter (HOSPITAL_COMMUNITY)
Admission: RE | Disposition: A | Discharge status: Home Health | Payer: Medicare Other | Source: Ambulatory Visit | Attending: Neurosurgery

## 2014-06-21 ENCOUNTER — Inpatient Hospital Stay (HOSPITAL_COMMUNITY): Payer: Medicare Other | Admitting: Vascular Surgery

## 2014-06-21 ENCOUNTER — Inpatient Hospital Stay (HOSPITAL_COMMUNITY): Payer: Medicare Other

## 2014-06-21 ENCOUNTER — Inpatient Hospital Stay (HOSPITAL_COMMUNITY): Payer: Medicare Other | Admitting: Anesthesiology

## 2014-06-21 DIAGNOSIS — K219 Gastro-esophageal reflux disease without esophagitis: Secondary | ICD-10-CM | POA: Diagnosis present

## 2014-06-21 DIAGNOSIS — E785 Hyperlipidemia, unspecified: Secondary | ICD-10-CM | POA: Diagnosis present

## 2014-06-21 DIAGNOSIS — M419 Scoliosis, unspecified: Secondary | ICD-10-CM | POA: Diagnosis present

## 2014-06-21 DIAGNOSIS — M4806 Spinal stenosis, lumbar region: Secondary | ICD-10-CM | POA: Diagnosis not present

## 2014-06-21 DIAGNOSIS — R739 Hyperglycemia, unspecified: Secondary | ICD-10-CM | POA: Diagnosis not present

## 2014-06-21 DIAGNOSIS — I959 Hypotension, unspecified: Secondary | ICD-10-CM | POA: Diagnosis not present

## 2014-06-21 DIAGNOSIS — M4126 Other idiopathic scoliosis, lumbar region: Principal | ICD-10-CM | POA: Diagnosis present

## 2014-06-21 DIAGNOSIS — T380X5A Adverse effect of glucocorticoids and synthetic analogues, initial encounter: Secondary | ICD-10-CM | POA: Diagnosis not present

## 2014-06-21 DIAGNOSIS — I491 Atrial premature depolarization: Secondary | ICD-10-CM | POA: Diagnosis not present

## 2014-06-21 DIAGNOSIS — Z7982 Long term (current) use of aspirin: Secondary | ICD-10-CM

## 2014-06-21 DIAGNOSIS — Z87891 Personal history of nicotine dependence: Secondary | ICD-10-CM

## 2014-06-21 DIAGNOSIS — M25519 Pain in unspecified shoulder: Secondary | ICD-10-CM

## 2014-06-21 DIAGNOSIS — I2510 Atherosclerotic heart disease of native coronary artery without angina pectoris: Secondary | ICD-10-CM | POA: Diagnosis not present

## 2014-06-21 DIAGNOSIS — M1990 Unspecified osteoarthritis, unspecified site: Secondary | ICD-10-CM | POA: Diagnosis not present

## 2014-06-21 DIAGNOSIS — K567 Ileus, unspecified: Secondary | ICD-10-CM | POA: Diagnosis not present

## 2014-06-21 DIAGNOSIS — M4807 Spinal stenosis, lumbosacral region: Secondary | ICD-10-CM | POA: Diagnosis present

## 2014-06-21 DIAGNOSIS — M545 Low back pain, unspecified: Secondary | ICD-10-CM

## 2014-06-21 DIAGNOSIS — Y838 Other surgical procedures as the cause of abnormal reaction of the patient, or of later complication, without mention of misadventure at the time of the procedure: Secondary | ICD-10-CM | POA: Diagnosis not present

## 2014-06-21 DIAGNOSIS — Y92239 Unspecified place in hospital as the place of occurrence of the external cause: Secondary | ICD-10-CM

## 2014-06-21 DIAGNOSIS — M4317 Spondylolisthesis, lumbosacral region: Secondary | ICD-10-CM

## 2014-06-21 DIAGNOSIS — Z9049 Acquired absence of other specified parts of digestive tract: Secondary | ICD-10-CM | POA: Diagnosis present

## 2014-06-21 DIAGNOSIS — Z96651 Presence of right artificial knee joint: Secondary | ICD-10-CM | POA: Diagnosis present

## 2014-06-21 DIAGNOSIS — J9811 Atelectasis: Secondary | ICD-10-CM | POA: Diagnosis not present

## 2014-06-21 DIAGNOSIS — Z85828 Personal history of other malignant neoplasm of skin: Secondary | ICD-10-CM

## 2014-06-21 DIAGNOSIS — M5416 Radiculopathy, lumbar region: Secondary | ICD-10-CM | POA: Diagnosis present

## 2014-06-21 DIAGNOSIS — R000 Tachycardia, unspecified: Secondary | ICD-10-CM | POA: Diagnosis not present

## 2014-06-21 DIAGNOSIS — N402 Nodular prostate without lower urinary tract symptoms: Secondary | ICD-10-CM | POA: Diagnosis present

## 2014-06-21 DIAGNOSIS — J95821 Acute postprocedural respiratory failure: Secondary | ICD-10-CM | POA: Diagnosis not present

## 2014-06-21 DIAGNOSIS — Z01818 Encounter for other preprocedural examination: Secondary | ICD-10-CM

## 2014-06-21 DIAGNOSIS — J9600 Acute respiratory failure, unspecified whether with hypoxia or hypercapnia: Secondary | ICD-10-CM | POA: Diagnosis not present

## 2014-06-21 DIAGNOSIS — E875 Hyperkalemia: Secondary | ICD-10-CM | POA: Diagnosis not present

## 2014-06-21 DIAGNOSIS — M4120 Other idiopathic scoliosis, site unspecified: Secondary | ICD-10-CM | POA: Diagnosis not present

## 2014-06-21 DIAGNOSIS — I493 Ventricular premature depolarization: Secondary | ICD-10-CM | POA: Diagnosis not present

## 2014-06-21 DIAGNOSIS — M4182 Other forms of scoliosis, cervical region: Secondary | ICD-10-CM | POA: Diagnosis not present

## 2014-06-21 DIAGNOSIS — Z79899 Other long term (current) drug therapy: Secondary | ICD-10-CM

## 2014-06-21 DIAGNOSIS — M4310 Spondylolisthesis, site unspecified: Secondary | ICD-10-CM | POA: Diagnosis not present

## 2014-06-21 DIAGNOSIS — M4326 Fusion of spine, lumbar region: Secondary | ICD-10-CM | POA: Diagnosis not present

## 2014-06-21 DIAGNOSIS — Z87442 Personal history of urinary calculi: Secondary | ICD-10-CM | POA: Diagnosis not present

## 2014-06-21 DIAGNOSIS — Z419 Encounter for procedure for purposes other than remedying health state, unspecified: Secondary | ICD-10-CM

## 2014-06-21 HISTORY — PX: ANTERIOR LAT LUMBAR FUSION: SHX1168

## 2014-06-21 SURGERY — ANTERIOR LATERAL LUMBAR FUSION 2 LEVELS
Anesthesia: General | Site: Back

## 2014-06-21 MED ORDER — FENTANYL CITRATE (PF) 250 MCG/5ML IJ SOLN
INTRAMUSCULAR | Status: AC
  Filled 2014-06-21: qty 5

## 2014-06-21 MED ORDER — CEFAZOLIN SODIUM-DEXTROSE 2-3 GM-% IV SOLR
INTRAVENOUS | Status: DC | PRN
  Administered 2014-06-21: 2 g via INTRAVENOUS

## 2014-06-21 MED ORDER — LIDOCAINE-EPINEPHRINE 1 %-1:100000 IJ SOLN
INTRAMUSCULAR | Status: DC | PRN
  Administered 2014-06-21: 10 mL

## 2014-06-21 MED ORDER — THROMBIN 5000 UNITS EX SOLR
CUTANEOUS | Status: DC | PRN
  Administered 2014-06-21 (×2): 5000 [IU] via TOPICAL

## 2014-06-21 MED ORDER — HYDROMORPHONE HCL 1 MG/ML IJ SOLN
INTRAMUSCULAR | Status: AC
  Filled 2014-06-21: qty 1

## 2014-06-21 MED ORDER — HEMOSTATIC AGENTS (NO CHARGE) OPTIME
TOPICAL | Status: DC | PRN
  Administered 2014-06-21: 1 via TOPICAL

## 2014-06-21 MED ORDER — DEXAMETHASONE SODIUM PHOSPHATE 10 MG/ML IJ SOLN
INTRAMUSCULAR | Status: AC
  Filled 2014-06-21: qty 1

## 2014-06-21 MED ORDER — PHENYLEPHRINE 40 MCG/ML (10ML) SYRINGE FOR IV PUSH (FOR BLOOD PRESSURE SUPPORT)
PREFILLED_SYRINGE | INTRAVENOUS | Status: AC
  Filled 2014-06-21: qty 10

## 2014-06-21 MED ORDER — MIDAZOLAM HCL 5 MG/5ML IJ SOLN
INTRAMUSCULAR | Status: DC | PRN
  Administered 2014-06-21 (×2): 1 mg via INTRAVENOUS

## 2014-06-21 MED ORDER — MIDAZOLAM HCL 2 MG/2ML IJ SOLN
INTRAMUSCULAR | Status: AC
  Filled 2014-06-21: qty 2

## 2014-06-21 MED ORDER — ARTIFICIAL TEARS OP OINT
TOPICAL_OINTMENT | OPHTHALMIC | Status: AC
  Filled 2014-06-21: qty 3.5

## 2014-06-21 MED ORDER — PHENOL 1.4 % MT LIQD: 1.0000 | OROMUCOSAL | Status: DC | PRN

## 2014-06-21 MED ORDER — SUCCINYLCHOLINE CHLORIDE 20 MG/ML IJ SOLN
INTRAMUSCULAR | Status: AC
  Filled 2014-06-21: qty 1

## 2014-06-21 MED ORDER — PROPOFOL 10 MG/ML IV BOLUS
INTRAVENOUS | Status: AC
  Filled 2014-06-21: qty 20

## 2014-06-21 MED ORDER — DEXAMETHASONE SODIUM PHOSPHATE 10 MG/ML IJ SOLN
INTRAMUSCULAR | Status: DC | PRN
  Administered 2014-06-21: 10 mg via INTRAVENOUS

## 2014-06-21 MED ORDER — CEFAZOLIN SODIUM-DEXTROSE 2-3 GM-% IV SOLR
2.0000 g | Freq: Three times a day (TID) | INTRAVENOUS | Status: AC
  Administered 2014-06-21 – 2014-06-22 (×2): 2 g via INTRAVENOUS
  Filled 2014-06-21 (×2): qty 50

## 2014-06-21 MED ORDER — SODIUM CHLORIDE 0.9 % IJ SOLN: 3.0000 mL | INTRAMUSCULAR | Status: DC | PRN

## 2014-06-21 MED ORDER — LIDOCAINE HCL (CARDIAC) 20 MG/ML IV SOLN
INTRAVENOUS | Status: DC | PRN
  Administered 2014-06-21: 80 mg via INTRAVENOUS

## 2014-06-21 MED ORDER — PHENYLEPHRINE HCL 10 MG/ML IJ SOLN
10.0000 mg | INTRAVENOUS | Status: DC | PRN
  Administered 2014-06-21: 10 ug/min via INTRAVENOUS

## 2014-06-21 MED ORDER — LIDOCAINE HCL (CARDIAC) 20 MG/ML IV SOLN
INTRAVENOUS | Status: AC
  Filled 2014-06-21: qty 5

## 2014-06-21 MED ORDER — SODIUM CHLORIDE 0.9 % IJ SOLN
3.0000 mL | Freq: Two times a day (BID) | INTRAMUSCULAR | Status: DC
  Administered 2014-06-23: 3 mL via INTRAVENOUS

## 2014-06-21 MED ORDER — HYDROMORPHONE HCL 1 MG/ML IJ SOLN
0.5000 mg | INTRAMUSCULAR | Status: DC | PRN
  Administered 2014-06-21 – 2014-06-23 (×6): 1 mg via INTRAVENOUS
  Administered 2014-06-24: 0.5 mg via INTRAVENOUS
  Administered 2014-06-24: 1 mg via INTRAVENOUS
  Administered 2014-06-24: 0.5 mg via INTRAVENOUS
  Administered 2014-06-24: 1 mg via INTRAVENOUS
  Administered 2014-06-25 (×2): 0.5 mg via INTRAVENOUS
  Filled 2014-06-21 (×10): qty 1

## 2014-06-21 MED ORDER — PROPOFOL INFUSION 10 MG/ML OPTIME
INTRAVENOUS | Status: DC | PRN
  Administered 2014-06-21: 40 ug/kg/min via INTRAVENOUS

## 2014-06-21 MED ORDER — ONDANSETRON HCL 4 MG/2ML IJ SOLN
INTRAMUSCULAR | Status: DC | PRN
  Administered 2014-06-21: 4 mg via INTRAVENOUS

## 2014-06-21 MED ORDER — OXYCODONE-ACETAMINOPHEN 5-325 MG PO TABS
1.0000 | ORAL_TABLET | ORAL | Status: DC | PRN
  Filled 2014-06-21: qty 1

## 2014-06-21 MED ORDER — FENTANYL CITRATE (PF) 250 MCG/5ML IJ SOLN
INTRAMUSCULAR | Status: DC | PRN
  Administered 2014-06-21 (×8): 50 ug via INTRAVENOUS
  Administered 2014-06-21: 100 ug via INTRAVENOUS

## 2014-06-21 MED ORDER — ACETAMINOPHEN 650 MG RE SUPP: 650.0000 mg | RECTAL | Status: DC | PRN

## 2014-06-21 MED ORDER — STERILE WATER FOR INJECTION IJ SOLN
INTRAMUSCULAR | Status: AC
  Filled 2014-06-21: qty 10

## 2014-06-21 MED ORDER — LACTATED RINGERS IV SOLN
INTRAVENOUS | Status: DC | PRN
  Administered 2014-06-21 (×2): via INTRAVENOUS

## 2014-06-21 MED ORDER — DIPHENHYDRAMINE HCL 50 MG/ML IJ SOLN
INTRAMUSCULAR | Status: AC
  Filled 2014-06-21: qty 1

## 2014-06-21 MED ORDER — HYDROMORPHONE HCL 1 MG/ML IJ SOLN
0.2500 mg | INTRAMUSCULAR | Status: DC | PRN
  Administered 2014-06-21 (×3): 0.5 mg via INTRAVENOUS

## 2014-06-21 MED ORDER — CYCLOBENZAPRINE HCL 10 MG PO TABS
10.0000 mg | ORAL_TABLET | Freq: Three times a day (TID) | ORAL | Status: DC | PRN
  Filled 2014-06-21: qty 1

## 2014-06-21 MED ORDER — DIAZEPAM 5 MG PO TABS
10.0000 mg | ORAL_TABLET | Freq: Three times a day (TID) | ORAL | Status: DC | PRN
  Administered 2014-06-26 – 2014-07-03 (×7): 10 mg via ORAL
  Filled 2014-06-21 (×7): qty 2

## 2014-06-21 MED ORDER — EPHEDRINE SULFATE 50 MG/ML IJ SOLN
INTRAMUSCULAR | Status: DC | PRN
  Administered 2014-06-21: 10 mg via INTRAVENOUS

## 2014-06-21 MED ORDER — ONDANSETRON HCL 4 MG/2ML IJ SOLN
INTRAMUSCULAR | Status: AC
  Filled 2014-06-21: qty 2

## 2014-06-21 MED ORDER — 0.9 % SODIUM CHLORIDE (POUR BTL) OPTIME
TOPICAL | Status: DC | PRN
  Administered 2014-06-21: 1000 mL

## 2014-06-21 MED ORDER — SODIUM CHLORIDE 0.9 % IV SOLN
250.0000 mL | INTRAVENOUS | Status: DC
  Administered 2014-06-21: 250 mL via INTRAVENOUS
  Administered 2014-06-24 – 2014-06-25 (×2): via INTRAVENOUS

## 2014-06-21 MED ORDER — MENTHOL 3 MG MT LOZG: 1.0000 | LOZENGE | OROMUCOSAL | Status: DC | PRN

## 2014-06-21 MED ORDER — OXYCODONE HCL 5 MG/5ML PO SOLN: 5.0000 mg | Freq: Once | ORAL | Status: DC | PRN

## 2014-06-21 MED ORDER — SODIUM CHLORIDE 0.9 % IR SOLN
Status: DC | PRN
  Administered 2014-06-21: 13:00:00

## 2014-06-21 MED ORDER — BUPIVACAINE HCL 0.25 % IJ SOLN
INTRAMUSCULAR | Status: DC | PRN
  Administered 2014-06-21: 7 mL

## 2014-06-21 MED ORDER — PROMETHAZINE HCL 25 MG/ML IJ SOLN: 6.2500 mg | INTRAMUSCULAR | Status: DC | PRN

## 2014-06-21 MED ORDER — OXYCODONE HCL 5 MG PO TABS: 5.0000 mg | ORAL_TABLET | Freq: Once | ORAL | Status: DC | PRN

## 2014-06-21 MED ORDER — ROCURONIUM BROMIDE 50 MG/5ML IV SOLN
INTRAVENOUS | Status: AC
  Filled 2014-06-21: qty 1

## 2014-06-21 MED ORDER — SODIUM CHLORIDE 0.9 % IJ SOLN
INTRAMUSCULAR | Status: AC
  Filled 2014-06-21: qty 10

## 2014-06-21 MED ORDER — ACETAMINOPHEN 325 MG PO TABS: 650.0000 mg | ORAL_TABLET | ORAL | Status: DC | PRN

## 2014-06-21 MED ORDER — ONDANSETRON HCL 4 MG/2ML IJ SOLN
4.0000 mg | INTRAMUSCULAR | Status: DC | PRN
  Administered 2014-06-21 – 2014-06-24 (×7): 4 mg via INTRAVENOUS
  Filled 2014-06-21 (×8): qty 2

## 2014-06-21 MED ORDER — EPHEDRINE SULFATE 50 MG/ML IJ SOLN
INTRAMUSCULAR | Status: AC
  Filled 2014-06-21: qty 1

## 2014-06-21 MED ORDER — DIPHENHYDRAMINE HCL 50 MG/ML IJ SOLN
INTRAMUSCULAR | Status: DC | PRN
  Administered 2014-06-21: 12.5 mg via INTRAVENOUS

## 2014-06-21 MED ORDER — SUCCINYLCHOLINE CHLORIDE 20 MG/ML IJ SOLN
INTRAMUSCULAR | Status: DC | PRN
  Administered 2014-06-21: 120 mg via INTRAVENOUS

## 2014-06-21 MED ORDER — ASPIRIN EC 81 MG PO TBEC
81.0000 mg | DELAYED_RELEASE_TABLET | Freq: Every day | ORAL | Status: DC
  Filled 2014-06-21: qty 1

## 2014-06-21 MED ORDER — OXYCODONE-ACETAMINOPHEN 5-325 MG PO TABS
1.0000 | ORAL_TABLET | ORAL | Status: DC | PRN
  Administered 2014-06-21 – 2014-06-24 (×8): 2 via ORAL
  Filled 2014-06-21: qty 2
  Filled 2014-06-21 (×2): qty 1
  Filled 2014-06-21 (×3): qty 2
  Filled 2014-06-21: qty 1
  Filled 2014-06-21 (×4): qty 2

## 2014-06-21 MED ORDER — ROSUVASTATIN CALCIUM 10 MG PO TABS
10.0000 mg | ORAL_TABLET | Freq: Every day | ORAL | Status: DC
  Administered 2014-06-21 – 2014-07-03 (×12): 10 mg via ORAL
  Filled 2014-06-21 (×13): qty 1

## 2014-06-21 MED ORDER — PROPOFOL 10 MG/ML IV BOLUS
INTRAVENOUS | Status: DC | PRN
  Administered 2014-06-21: 40 mg via INTRAVENOUS
  Administered 2014-06-21: 20 mg via INTRAVENOUS
  Administered 2014-06-21: 200 mg via INTRAVENOUS

## 2014-06-21 MED ORDER — LACTATED RINGERS IV SOLN
INTRAVENOUS | Status: DC
  Administered 2014-06-21: 09:00:00 via INTRAVENOUS

## 2014-06-21 SURGICAL SUPPLY — 78 items
ADH SKN CLS APL DERMABOND .7 (GAUZE/BANDAGES/DRESSINGS) ×1
ADH SKN CLS LQ APL DERMABOND (GAUZE/BANDAGES/DRESSINGS) ×1
APL SKNCLS STERI-STRIP NONHPOA (GAUZE/BANDAGES/DRESSINGS) ×1
BAG DECANTER FOR FLEXI CONT (MISCELLANEOUS) ×3 IMPLANT
BENZOIN TINCTURE PRP APPL 2/3 (GAUZE/BANDAGES/DRESSINGS) ×3 IMPLANT
BLADE CLIPPER SURG (BLADE) ×2 IMPLANT
BONE ALLOSTEM MORSELIZED 5CC (Bone Implant) ×4 IMPLANT
CLOSURE WOUND 1/2 X4 (GAUZE/BANDAGES/DRESSINGS) ×2
CONT SPEC 4OZ CLIKSEAL STRL BL (MISCELLANEOUS) ×4 IMPLANT
COVER BACK TABLE 24X17X13 BIG (DRAPES) IMPLANT
DERMABOND ADHESIVE PROPEN (GAUZE/BANDAGES/DRESSINGS) ×2
DERMABOND ADVANCED (GAUZE/BANDAGES/DRESSINGS) ×2
DERMABOND ADVANCED .7 DNX12 (GAUZE/BANDAGES/DRESSINGS) IMPLANT
DERMABOND ADVANCED .7 DNX6 (GAUZE/BANDAGES/DRESSINGS) IMPLANT
DRAPE C-ARM 42X72 X-RAY (DRAPES) ×3 IMPLANT
DRAPE C-ARMOR (DRAPES) ×3 IMPLANT
DRAPE LAPAROTOMY 100X72X124 (DRAPES) ×3 IMPLANT
DRAPE POUCH INSTRU U-SHP 10X18 (DRAPES) ×3 IMPLANT
DRAPE SURG 17X23 STRL (DRAPES) ×10 IMPLANT
DRSG OPSITE 4X5.5 SM (GAUZE/BANDAGES/DRESSINGS) ×2 IMPLANT
DRSG OPSITE POSTOP 3X4 (GAUZE/BANDAGES/DRESSINGS) ×2 IMPLANT
DRSG OPSITE POSTOP 4X6 (GAUZE/BANDAGES/DRESSINGS) ×2 IMPLANT
DRSG TELFA 3X8 NADH (GAUZE/BANDAGES/DRESSINGS) IMPLANT
ELECT BLADE 4.0 EZ CLEAN MEGAD (MISCELLANEOUS) ×3
ELECT REM PT RETURN 9FT ADLT (ELECTROSURGICAL) ×3
ELECTRODE BLDE 4.0 EZ CLN MEGD (MISCELLANEOUS) IMPLANT
ELECTRODE REM PT RTRN 9FT ADLT (ELECTROSURGICAL) ×1 IMPLANT
GAUZE SPONGE 4X4 16PLY XRAY LF (GAUZE/BANDAGES/DRESSINGS) IMPLANT
GLOVE BIO SURGEON STRL SZ8 (GLOVE) ×5 IMPLANT
GLOVE BIOGEL PI IND STRL 7.0 (GLOVE) IMPLANT
GLOVE BIOGEL PI IND STRL 7.5 (GLOVE) IMPLANT
GLOVE BIOGEL PI INDICATOR 7.0 (GLOVE) ×2
GLOVE BIOGEL PI INDICATOR 7.5 (GLOVE) ×4
GLOVE ECLIPSE 7.5 STRL STRAW (GLOVE) ×6 IMPLANT
GLOVE ECLIPSE 9.0 STRL (GLOVE) ×2 IMPLANT
GLOVE EXAM NITRILE LRG STRL (GLOVE) IMPLANT
GLOVE EXAM NITRILE MD LF STRL (GLOVE) ×4 IMPLANT
GLOVE EXAM NITRILE XL STR (GLOVE) IMPLANT
GLOVE EXAM NITRILE XS STR PU (GLOVE) IMPLANT
GLOVE INDICATOR 8.5 STRL (GLOVE) ×5 IMPLANT
GLOVE SURG SS PI 7.0 STRL IVOR (GLOVE) ×4 IMPLANT
GOWN STRL REUS W/ TWL LRG LVL3 (GOWN DISPOSABLE) IMPLANT
GOWN STRL REUS W/ TWL XL LVL3 (GOWN DISPOSABLE) ×1 IMPLANT
GOWN STRL REUS W/TWL 2XL LVL3 (GOWN DISPOSABLE) IMPLANT
GOWN STRL REUS W/TWL LRG LVL3 (GOWN DISPOSABLE)
GOWN STRL REUS W/TWL XL LVL3 (GOWN DISPOSABLE) ×12
INTERLOCK LORDOTIC 12X22X50 (Bone Implant) IMPLANT
KIT BASIN OR (CUSTOM PROCEDURE TRAY) ×3 IMPLANT
KIT DILATOR XLIF 5 (KITS) IMPLANT
KIT NDL NVM5 EMG ELECT (KITS) IMPLANT
KIT NEEDLE NVM5 EMG ELECT (KITS) ×1 IMPLANT
KIT NEEDLE NVM5 EMG ELECTRODE (KITS) ×2
KIT ROOM TURNOVER OR (KITS) ×3 IMPLANT
KIT SURGICAL ACCESS MAXCESS 4 (KITS) ×2 IMPLANT
KIT XLIF (KITS) ×2
LIQUID BAND (GAUZE/BANDAGES/DRESSINGS) ×2 IMPLANT
LORDOTIC 12X22X50 (Bone Implant) ×6 IMPLANT
NDL HYPO 25X1 1.5 SAFETY (NEEDLE) ×1 IMPLANT
NEEDLE HYPO 25X1 1.5 SAFETY (NEEDLE) ×3 IMPLANT
NS IRRIG 1000ML POUR BTL (IV SOLUTION) ×3 IMPLANT
PACK LAMINECTOMY NEURO (CUSTOM PROCEDURE TRAY) ×3 IMPLANT
PAD DRESSING TELFA 3X8 NADH (GAUZE/BANDAGES/DRESSINGS) ×1 IMPLANT
PUTTY DBX 1CC (Putty) ×6 IMPLANT
PUTTY DBX 1CC DEPUY (Putty) IMPLANT
SPONGE LAP 4X18 X RAY DECT (DISPOSABLE) IMPLANT
SPONGE SURGIFOAM ABS GEL SZ50 (HEMOSTASIS) ×2 IMPLANT
STRIP CLOSURE SKIN 1/2X4 (GAUZE/BANDAGES/DRESSINGS) ×3 IMPLANT
SUT VIC AB 0 CT1 18XCR BRD8 (SUTURE) IMPLANT
SUT VIC AB 0 CT1 8-18 (SUTURE) ×3
SUT VIC AB 2-0 CT1 18 (SUTURE) ×3 IMPLANT
SUT VICRYL 4-0 PS2 18IN ABS (SUTURE) ×6 IMPLANT
SWABSTICK BENZOIN STERILE (MISCELLANEOUS) ×1 IMPLANT
SYR 20ML ECCENTRIC (SYRINGE) ×3 IMPLANT
TAPE CLOTH 3X10 TAN LF (GAUZE/BANDAGES/DRESSINGS) ×7 IMPLANT
TOWEL OR 17X24 6PK STRL BLUE (TOWEL DISPOSABLE) ×5 IMPLANT
TOWEL OR 17X26 10 PK STRL BLUE (TOWEL DISPOSABLE) ×3 IMPLANT
TRAY FOLEY CATH 14FRSI W/METER (CATHETERS) ×1 IMPLANT
WATER STERILE IRR 1000ML POUR (IV SOLUTION) ×3 IMPLANT

## 2014-06-21 NOTE — H&P (Signed)
Stephen Rowe is an 74 y.o. male.   Chief Complaint: Back and left leg pain HPI: Patient is very pleasant 74 year old gentleman is a long-standing issues with his back unknown grade 1 spondylolisthesis at L5-S1 severe spinal stenosis at L3-4 3445 and L5-S1. 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 Patient's failed all forms of conservative treatment with epidural steroid injections facet blocks anti-inflammatories physical therapy multiple hospitalizations and with his progressive clinical syndrome imaging and failure conservative treatment I recommended decompression stabilization procedure with correction of his deformity from T10 to the ilium. He presents today for the first stage of this 2 stage procedure where we'll do the anterolateral interbody fusions at L2-3 and L3-4. I've extensively gone over the risks and benefits of the operation with the patient as well as perioperative course expectations of outcome and alternatives of surgery and he understands and agrees to proceed forward.  Past Medical History  Diagnosis Date  . Arthritis   . Prostatitis   . Hyperlipidemia   . History of kidney stones   . GERD (gastroesophageal reflux disease)   . PONV (postoperative nausea and vomiting)   . Bulging disc     at L4 and L5  . Prostate nodule   . Skin cancer of face   . CAD (coronary artery disease)     3 blockages, unchanged in past 15 years, s/p cardiac cath X 2 but no stents    Past Surgical History  Procedure Laterality Date  . Kidney stone surgery    . Rotator cuff repair      right  . Cholecystectomy    . Knee surgery      right  . Hand surgery      right  . Skin cancer excision      left ear  . Colonoscopy  11/18/2011    Procedure: COLONOSCOPY;  Surgeon: Daneil Dolin, MD;  Location: [facility];  Service: Endoscopy;  Laterality: N/A;  8:45  . Knee surgery Right   . Joint replacement    . Cardiac catheterization      X 2;  last 01/17/2003 ([facility]): 50% pLAD, 20% pOM1, 15% ostial LM. NL LVF. REC: [facility] (Dr. Elonda Husky)    Family History  Problem Relation Age of Onset  . Colon cancer Neg Hx    Social History:  reports that he has quit smoking. His smoking use included Cigarettes. He has a 4 pack-year smoking history. He does not have any smokeless tobacco history on file. He reports that he does not drink alcohol or use illicit drugs.  Allergies: No Known Allergies  Medications Prior to Admission  Medication Sig Dispense Refill  . aspirin EC 81 MG tablet Take 81 mg by mouth daily.    . diazepam (VALIUM) 10 MG tablet Take 10 mg by mouth every 6 (six) hours as needed (Muscle spasm).    . rosuvastatin (CRESTOR) 10 MG tablet Take 10 mg by mouth daily.    Marland Kitchen oxyCODONE-acetaminophen (PERCOCET/ROXICET) 5-325 MG per tablet Take 1 tablet by mouth every 4 (four) hours as needed for severe pain. 20 tablet 0    Results for orders placed or performed during the hospital encounter of 06/20/14 (from the past 48 hour(s))  CBC     Status: None   Collection Time: 06/20/14  9:31 AM  Result Value Ref Range   WBC 6.0 4.0 - 10.5 K/uL   RBC 5.51 4.22 - 5.81 MIL/uL   Hemoglobin 16.5  13.0 - 17.0 g/dL   HCT 47.8 39.0 - 52.0 %   MCV 86.8 78.0 - 100.0 fL   MCH 29.9 26.0 - 34.0 pg   MCHC 34.5 30.0 - 36.0 g/dL   RDW 12.7 11.5 - 15.5 %   Platelets 180 150 - 400 K/uL  Basic metabolic panel     Status: Abnormal   Collection Time: 06/20/14  9:31 AM  Result Value Ref Range   Sodium 137 135 - 145 mmol/L   Potassium 4.2 3.5 - 5.1 mmol/L   Chloride 105 101 - 111 mmol/L   CO2 24 22 - 32 mmol/L   Glucose, Bld 123 (H) 65 - 99 mg/dL   BUN 14 6 - 20 mg/dL   Creatinine, Ser 0.92 0.61 - 1.24 mg/dL   Calcium 9.4 8.9 - 10.3 mg/dL   GFR calc non Af Amer >60 >60 mL/min   GFR calc Af Amer >60 >60 mL/min    Comment: (NOTE) The eGFR has been calculated using the CKD EPI equation. This calculation has not been validated in all clinical  situations. eGFR's persistently <60 mL/min signify possible Chronic Kidney Disease.    Anion gap 8 5 - 15  Surgical pcr screen     Status: None   Collection Time: 06/20/14  9:31 AM  Result Value Ref Range   MRSA, PCR NEGATIVE NEGATIVE   Staphylococcus aureus NEGATIVE NEGATIVE    Comment:        The Xpert SA Assay (FDA approved for NASAL specimens in patients over 76 years of age), is one component of a comprehensive surveillance program.  Test performance has been validated by [facility] for patients greater than or equal to 81 year old. It is not intended to diagnose infection nor to guide or monitor treatment.   Type and screen     Status: None   Collection Time: 06/20/14  9:35 AM  Result Value Ref Range   ABO/RH(D) O POS    Antibody Screen NEG    Sample Expiration 07/04/2014   ABO/Rh     Status: None   Collection Time: 06/20/14  9:35 AM  Result Value Ref Range   ABO/RH(D) O POS    No results found.  Review of Systems  Constitutional: Negative.   HENT: Negative.   Eyes: Negative.   Respiratory: Negative.   Cardiovascular: Negative.   Gastrointestinal: Negative.   Genitourinary: Negative.   Musculoskeletal: Positive for myalgias, back pain and joint pain.  Skin: Negative.   Neurological: Positive for tingling and sensory change.  Psychiatric/Behavioral: Negative.     Blood pressure 124/80, pulse 78, temperature 97.9 F (36.6 C), temperature source Oral, resp. rate 20, height _0  (1.778 m), weight 94.348 kg (208 lb), SpO2 97 %. Physical Exam  Constitutional: He is oriented to person, place, and time. He appears well-developed and well-nourished.  HENT:  Head: Normocephalic.  Eyes: Pupils are equal, round, and reactive to light.  Neck: Normal range of motion.  Respiratory: Effort normal.  GI: Soft. Bowel sounds are normal.  Neurological: He is alert and oriented to person, place, and time. He has normal strength. GCS eye subscore is 4. GCS verbal  subscore is 5. GCS motor subscore is 6.  Strength is 5 out of 5 in his iliopsoas, quads, his shoes, gastric, and tibialis, and EHL.  Skin: Skin is warm and dry.     Assessment/Plan 74 year old gentleman presents for the first stage of a two-stage procedure were today includes the anterolateral fusions at  L2-3 and L3-4.  Kaloni Bisaillon P 06/21/2014, 9:56 AM

## 2014-06-21 NOTE — Progress Notes (Signed)
Orthopedic Tech Progress Note Patient Details:  Stephen Rowe 1940/03/10 [id number] Brace order completed by bio-tech vendor. Patient ID: Stephen Rowe, male   DOB: [birth year], 74 y.o.   MRN: [id number]   Braulio Bosch 06/21/2014, 11:02 PM

## 2014-06-21 NOTE — Transfer of Care (Signed)
Immediate Anesthesia Transfer of Care Note  Patient: Stephen Rowe  Procedure(s) Performed: Procedure(s): ANTERIOR LATERAL LUMBAR FUSION Lumbar two-three, Lumbar three-four  (N/A)  Patient Location: PACU  Anesthesia Type:General  Level of Consciousness: sedated  Airway & Oxygen Therapy: Patient Spontanous Breathing and Patient connected to face mask oxygen  Post-op Assessment: Report given to RN and Post -op Vital signs reviewed and stable  Post vital signs: Reviewed and stable  Last Vitals:  Filed Vitals:   06/21/14 0843  BP: 124/80  Pulse: 78  Temp: 36.6 C  Resp: 20    Complications: No apparent anesthesia complications

## 2014-06-21 NOTE — Anesthesia Preprocedure Evaluation (Addendum)
Anesthesia Evaluation  Patient identified by MRN, date of birth, ID band Patient awake    Reviewed: Allergy & Precautions, NPO status , Patient's Chart, lab work & pertinent test results  History of Anesthesia Complications (+) PONV  Airway Mallampati: I  TM Distance: >3 FB Neck ROM: Full    Dental  (+) Teeth Intact, Dental Advisory Given   Pulmonary former smoker,  breath sounds clear to auscultation        Cardiovascular + CAD (Non-obstructive CAD on 2004 cath. No stents.) Rhythm:Regular Rate:Normal     Neuro/Psych negative neurological ROS     GI/Hepatic Neg liver ROS, GERD-  ,  Endo/Other  negative endocrine ROS  Renal/GU negative Renal ROS     Musculoskeletal  (+) Arthritis -,   Abdominal   Peds  Hematology negative hematology ROS (+)   Anesthesia Other Findings   Reproductive/Obstetrics                          Lab Results  Component Value Date   WBC 6.0 06/20/2014   HGB 16.5 06/20/2014   HCT 47.8 06/20/2014   MCV 86.8 06/20/2014   PLT 180 06/20/2014   Lab Results  Component Value Date   CREATININE 0.92 06/20/2014   BUN 14 06/20/2014   NA 137 06/20/2014   K 4.2 06/20/2014   CL 105 06/20/2014   CO2 24 06/20/2014    Anesthesia Physical Anesthesia Plan  ASA: II  Anesthesia Plan: General   Post-op Pain Management:    Induction: Intravenous  Airway Management Planned: Oral ETT  Additional Equipment:   Intra-op Plan:   Post-operative Plan: Extubation in OR  Informed Consent: I have reviewed the patients History and Physical, chart, labs and discussed the procedure including the risks, benefits and alternatives for the proposed anesthesia with the patient or authorized representative who has indicated his/her understanding and acceptance.   Dental advisory given  Plan Discussed with: CRNA, Anesthesiologist and Surgeon  Anesthesia Plan Comments:        Anesthesia Quick Evaluation

## 2014-06-21 NOTE — Anesthesia Postprocedure Evaluation (Signed)
  Anesthesia Post-op Note  Patient: Stephen Rowe  Procedure(s) Performed: Procedure(s): ANTERIOR LATERAL LUMBAR FUSION Lumbar two-three, Lumbar three-four  (N/A)  Patient Location: PACU  Anesthesia Type:General  Level of Consciousness: awake and alert   Airway and Oxygen Therapy: Patient Spontanous Breathing  Post-op Pain: mild  Post-op Assessment: Post-op Vital signs reviewed  Post-op Vital Signs: Reviewed  Last Vitals:  Filed Vitals:   06/21/14 1549  BP:   Pulse:   Temp: 36.8 C  Resp:     Complications: No apparent anesthesia complications

## 2014-06-21 NOTE — Anesthesia Procedure Notes (Signed)
Procedure Name: Intubation Date/Time: 06/21/2014 10:52 AM Performed by: Maude Leriche D Pre-anesthesia Checklist: Patient identified, Emergency Drugs available, Suction available, Patient being monitored and Timeout performed Patient Re-evaluated:Patient Re-evaluated prior to inductionOxygen Delivery Method: Circle system utilized Preoxygenation: Pre-oxygenation with 100% oxygen Intubation Type: IV induction Ventilation: Mask ventilation without difficulty Laryngoscope Size: Wiesen and 2 Grade View: Grade I Tube type: Oral Tube size: 7.5 mm Number of attempts: 1 Airway Equipment and Method: Stylet Placement Confirmation: ETT inserted through vocal cords under direct vision,  positive ETCO2 and breath sounds checked- equal and bilateral Secured at: 23 cm Tube secured with: Tape Dental Injury: Teeth and Oropharynx as per pre-operative assessment

## 2014-06-21 NOTE — Progress Notes (Signed)
Pt admitted from PACU with ALIF.  Honeycomb dressings to rt side CDI; Family at bedside.  Pt alert and oriented.  Rates pain a 7;  Medicated in pacu.  SCD's on, bed alarm set and pt understands to call for assistance before trying to get out of bed.  Foley in place and draining yellow urine.  Will continue to assess and monitor.

## 2014-06-21 NOTE — Op Note (Signed)
Preoperative diagnosis: Degenerative scoliosis L1 S1 lumbar spinal stenosis L2-to S1 with a grade 1 spondylosis at L5-S1 bilateral L4 and L5 radiculopathies  Postoperative diagnosis: Same  Procedure: This first stage of a two-stage operation #1 anterolateral interbody fusion L2-3 and L3-4 using the invasive peek cages packed with allostem and DBM mix  #2 open reduction spinal deformity L2-L4  Surgeon: Dominica Severin Shaquita Fort  Asst.: Mallie Mussel pool  Anesthesia: Gen.  EBL: Minimal  History of present illness: Patient is 74 year old gentleman is a long-standing back and bilateral leg pain worse on the left greater than done L4 and L5 nerve root pattern. Workup revealed severe spinal stenosis grade 1 spondylolisthesis at L5-S1 degenerative on top of idiopathic scoliosis L1-S1. The patient failed conservative treatment imaging findings and progressive clinical syndrome I recommended decompression stabilization procedure from T10 S1 with decompressive laminectomies and interbody fusions at L4-5 and L5-S1 anterolateral interbody fusions at L2-3 and L3-4. I recommended we did this in a two-stage procedure this is the first stage of the anterolateral fusions at L2-3 and L3-4. I extensively reviewed the risks and benefits of the operation with the patient as well as perioperative course expectations of outcome and alternatives surgery understood and agreed to proceed forward.  Operative procedure: Patient brought into the or was induced under general anesthesia positioned in the lateral decubitus position with right side up monitoring leads placed the bed was flexed slightly and fluoroscopy confirmed position and location of the scoliotic deformity at L2-3 and L3-4. Utilizing a 2 incision technique with a direct lateral and slight posterior incision. Both incisions were infiltrated with 6 mL lidocaine with epi and then identifying the TPN psoas posterior incisions were finger up and the lateral position I passed a dilator  down on the L3-4 disc space with fluoroscopic fluoroscopic guidance. With direct neural monitoring I chose an entry point in the central margins the disks that gave me the readings of 19 posteriorly at greater than 20 and all the other directions. Sequentially passed dilators and retractor monitoring at each step along the way. Then utilizing C-arm fluoroscopy confirmed to reposition retractor until adequate exposure been attained in the central part of the disc space. Annulus was then incised the spaces cleanout pituitary rongeurs using combination of Cobbs to do the contralateral release and proper prepping the endplates with rasps upgoing curettes and push/pulls adequate endplate preparation was achieved. Then using paddles to further release the disc space contralaterally I worked with after trials of 8 and 10 lordotic confirming with fluoroscopy good position of the implant adequate sizing. I selected a 22 mm cage 10 tall lordotic packed it with allostem and DBM mix and then under fluoroscopic guidance guided over slides into the disc space. These cages were 50 mm in length and once adequate position was achieved slides removed posterior fluoroscopy confirmed good position the implant attention was then taken and L2-3. Dr. was all removed and the dilator was then passed over L2-3 identified again the central aspect of the interbody the disc space placed a K wire and then using direct monitoring passing dilators over each other no readings greater than less than 20 were achieved in any direction. Placed retractor positioned at opened up and confirmed with AP and lateral fluoroscopy the working channel into the disc space. In a similar fashion clean out the disc space with Cobbs doing the chondral lateral release rasps push pulls preparing the endplates. After adequate discectomy and in preparation achieved in a similar fashion shows same size graft 50  mm in length 10 mm tall lordotic and 22 mm rod wide this was  placed over slides after being packed with the Allostem mix. Postop AP and lateral fluoroscopy were achieved in all views and all implants were in excellent position. The computer was then reentered into endplate calculations of L1 S1 and this gave [place] a 7 correction of his lordosis back into perfect alignment. Both wounds and copious irrigated meticulous and states was maintained in both wounds closed with interrupted Vicryl's and running 4 subcuticular's and then dressed patient recovered in stable condition. At the end of case on it counts sponge counts were correct.

## 2014-06-21 NOTE — OR Nursing (Signed)
Nuvasive nerve monitoring needles inserted by Lenna Sciara Yaira Bernardi RN at 1030.

## 2014-06-21 NOTE — OR Nursing (Signed)
Nuvasive nerve monitoring needles removed by Lenna Sciara Tracen Mahler RN at [phone]

## 2014-06-21 NOTE — Progress Notes (Signed)
Orthopedic Tech Progress Note Patient Details:  Stephen Rowe 04/11/1940 [id number] Patient ID: Stephen Rowe, male   DOB: Jul 23, 1940, 74 y.o.   MRN: [id number]  Called in brace order. Braulio Bosch 06/21/2014, 7:32 PM

## 2014-06-22 MED ORDER — ALUM & MAG HYDROXIDE-SIMETH 200-200-20 MG/5ML PO SUSP
30.0000 mL | Freq: Four times a day (QID) | ORAL | Status: DC | PRN
  Administered 2014-06-22: 30 mL via ORAL
  Filled 2014-06-22: qty 30

## 2014-06-22 NOTE — Progress Notes (Signed)
Postop day 1. Patient doing very well. Back pain very well controlled. No lower extremity pain. No complaints of numbness or weakness.  Afebrile. Vital stable. Motor and sensory exam intact. Dressing dry. Abdomen soft.  Doing well following stage I multilevel lumbar decompression and fusion. Plan stage II Monday.

## 2014-06-23 MED ORDER — ONDANSETRON HCL 4 MG PO TABS
4.0000 mg | ORAL_TABLET | Freq: Three times a day (TID) | ORAL | Status: DC | PRN
  Administered 2014-06-23 (×2): 4 mg via ORAL
  Filled 2014-06-23 (×3): qty 1

## 2014-06-23 MED ORDER — DOCUSATE SODIUM 100 MG PO CAPS
100.0000 mg | ORAL_CAPSULE | Freq: Two times a day (BID) | ORAL | Status: DC
  Administered 2014-06-23 – 2014-07-03 (×19): 100 mg via ORAL
  Filled 2014-06-23 (×20): qty 1

## 2014-06-23 MED ORDER — CEFAZOLIN SODIUM-DEXTROSE 2-3 GM-% IV SOLR
2.0000 g | INTRAVENOUS | Status: AC
  Administered 2014-06-24 (×3): 2 g via INTRAVENOUS
  Filled 2014-06-23: qty 50

## 2014-06-23 NOTE — Progress Notes (Signed)
Postop day 2. Patient with more soreness today. No radicular pain however. No complaints of numbness or weakness.  Afebrile. Vitals are stable. Urine output good. Positive flatus.  Motor and sensory intact. Abdomen soft.  Doing well following multilevel anterior lateral decompression infusion. Planned second stage decompression and fusion tomorrow.

## 2014-06-24 ENCOUNTER — Inpatient Hospital Stay (HOSPITAL_COMMUNITY): Admission: RE | Admit: 2014-06-24 | Payer: Medicare Other | Source: Ambulatory Visit | Admitting: Neurosurgery

## 2014-06-24 ENCOUNTER — Encounter (HOSPITAL_COMMUNITY): Payer: Self-pay | Admitting: Anesthesiology

## 2014-06-24 ENCOUNTER — Inpatient Hospital Stay (HOSPITAL_COMMUNITY): Payer: Medicare Other | Admitting: Anesthesiology

## 2014-06-24 ENCOUNTER — Encounter (HOSPITAL_COMMUNITY)
Admission: RE | Disposition: A | Discharge status: Home Health | Payer: Medicare Other | Source: Ambulatory Visit | Attending: Neurosurgery

## 2014-06-24 HISTORY — PX: APPLICATION OF INTRAOPERATIVE CT SCAN: SHX6668

## 2014-06-24 SURGERY — POSTERIOR LUMBAR FUSION 2 LEVEL
Anesthesia: General | Site: Back

## 2014-06-24 MED ORDER — PROPOFOL 10 MG/ML IV BOLUS
INTRAVENOUS | Status: DC | PRN
  Administered 2014-06-24: 80 mg via INTRAVENOUS

## 2014-06-24 MED ORDER — ALBUMIN HUMAN 5 % IV SOLN
INTRAVENOUS | Status: DC | PRN
  Administered 2014-06-24 (×2): via INTRAVENOUS

## 2014-06-24 MED ORDER — LIDOCAINE-EPINEPHRINE 1 %-1:100000 IJ SOLN
INTRAMUSCULAR | Status: DC | PRN
  Administered 2014-06-24: 10 mL

## 2014-06-24 MED ORDER — LACTATED RINGERS IV SOLN
INTRAVENOUS | Status: DC | PRN
  Administered 2014-06-24 (×3): via INTRAVENOUS

## 2014-06-24 MED ORDER — LIDOCAINE HCL (CARDIAC) 20 MG/ML IV SOLN
INTRAVENOUS | Status: DC | PRN
  Administered 2014-06-24: 100 mg via INTRAVENOUS

## 2014-06-24 MED ORDER — VECURONIUM BROMIDE 10 MG IV SOLR
INTRAVENOUS | Status: AC
  Filled 2014-06-24: qty 10

## 2014-06-24 MED ORDER — FENTANYL CITRATE (PF) 100 MCG/2ML IJ SOLN
INTRAMUSCULAR | Status: DC | PRN
  Administered 2014-06-24: 100 ug via INTRAVENOUS
  Administered 2014-06-24 (×4): 50 ug via INTRAVENOUS
  Administered 2014-06-24: 100 ug via INTRAVENOUS
  Administered 2014-06-24 – 2014-06-25 (×10): 50 ug via INTRAVENOUS
  Administered 2014-06-25: 100 ug via INTRAVENOUS

## 2014-06-24 MED ORDER — ROCURONIUM BROMIDE 100 MG/10ML IV SOLN
INTRAVENOUS | Status: DC | PRN
  Administered 2014-06-24: 50 mg via INTRAVENOUS

## 2014-06-24 MED ORDER — THROMBIN 20000 UNITS EX SOLR
CUTANEOUS | Status: DC | PRN
  Administered 2014-06-24: 20 mL via TOPICAL

## 2014-06-24 MED ORDER — PHENYLEPHRINE HCL 10 MG/ML IJ SOLN
10.0000 mg | INTRAMUSCULAR | Status: DC | PRN
  Administered 2014-06-24: 30 ug/min via INTRAVENOUS

## 2014-06-24 MED ORDER — MIDAZOLAM HCL 2 MG/2ML IJ SOLN
INTRAMUSCULAR | Status: AC
  Filled 2014-06-24: qty 2

## 2014-06-24 MED ORDER — SODIUM CHLORIDE 0.9 % IR SOLN
Status: DC | PRN
  Administered 2014-06-24 – 2014-06-25 (×2): 500 mL

## 2014-06-24 MED ORDER — DIPHENHYDRAMINE HCL 50 MG/ML IJ SOLN
INTRAMUSCULAR | Status: DC | PRN
  Administered 2014-06-24: 12.5 mg via INTRAVENOUS

## 2014-06-24 MED ORDER — PROPOFOL 10 MG/ML IV BOLUS
INTRAVENOUS | Status: AC
  Filled 2014-06-24: qty 20

## 2014-06-24 MED ORDER — EPHEDRINE SULFATE 50 MG/ML IJ SOLN
INTRAMUSCULAR | Status: AC
  Filled 2014-06-24: qty 1

## 2014-06-24 MED ORDER — SODIUM CHLORIDE 0.9 % IJ SOLN
INTRAMUSCULAR | Status: AC
  Filled 2014-06-24: qty 10

## 2014-06-24 MED ORDER — HEMOSTATIC AGENTS (NO CHARGE) OPTIME
TOPICAL | Status: DC | PRN
  Administered 2014-06-24: 1 via TOPICAL

## 2014-06-24 MED ORDER — FENTANYL CITRATE (PF) 250 MCG/5ML IJ SOLN
INTRAMUSCULAR | Status: AC
  Filled 2014-06-24: qty 5

## 2014-06-24 MED ORDER — MIDAZOLAM HCL 5 MG/5ML IJ SOLN
INTRAMUSCULAR | Status: DC | PRN
  Administered 2014-06-24: 2 mg via INTRAVENOUS

## 2014-06-24 MED ORDER — STERILE WATER FOR INJECTION IJ SOLN
INTRAMUSCULAR | Status: AC
  Filled 2014-06-24: qty 10

## 2014-06-24 MED ORDER — LIDOCAINE HCL (CARDIAC) 20 MG/ML IV SOLN
INTRAVENOUS | Status: AC
  Filled 2014-06-24: qty 5

## 2014-06-24 MED ORDER — METOCLOPRAMIDE HCL 5 MG/ML IJ SOLN
INTRAMUSCULAR | Status: DC | PRN
  Administered 2014-06-24: 10 mg via INTRAVENOUS

## 2014-06-24 MED ORDER — DEXAMETHASONE SODIUM PHOSPHATE 10 MG/ML IJ SOLN
INTRAMUSCULAR | Status: DC | PRN
  Administered 2014-06-24: 10 mg via INTRAVENOUS

## 2014-06-24 MED ORDER — THROMBIN 20000 UNITS EX KIT
PACK | CUTANEOUS | Status: DC | PRN
  Administered 2014-06-24: 20000 [IU] via TOPICAL

## 2014-06-24 MED ORDER — ACETAMINOPHEN 10 MG/ML IV SOLN
INTRAVENOUS | Status: AC
  Administered 2014-06-25: 1000 mg via INTRAVENOUS
  Filled 2014-06-24: qty 100

## 2014-06-24 MED ORDER — VECURONIUM BROMIDE 10 MG IV SOLR
INTRAVENOUS | Status: DC | PRN
  Administered 2014-06-24 (×2): 2 mg via INTRAVENOUS
  Administered 2014-06-24: 4 mg via INTRAVENOUS
  Administered 2014-06-24 (×2): 2 mg via INTRAVENOUS
  Administered 2014-06-24: 4 mg via INTRAVENOUS
  Administered 2014-06-24: 2 mg via INTRAVENOUS
  Administered 2014-06-24 (×2): 4 mg via INTRAVENOUS
  Administered 2014-06-24 (×3): 2 mg via INTRAVENOUS
  Administered 2014-06-25: 4 mg via INTRAVENOUS
  Administered 2014-06-25 (×2): 2 mg via INTRAVENOUS

## 2014-06-24 MED ORDER — PHENYLEPHRINE HCL 10 MG/ML IJ SOLN
INTRAMUSCULAR | Status: DC | PRN
  Administered 2014-06-24 (×2): 40 ug via INTRAVENOUS
  Administered 2014-06-24: 80 ug via INTRAVENOUS
  Administered 2014-06-24 (×2): 120 ug via INTRAVENOUS

## 2014-06-24 SURGICAL SUPPLY — 93 items
APL SKNCLS STERI-STRIP NONHPOA (GAUZE/BANDAGES/DRESSINGS) ×1
BAG DECANTER FOR FLEXI CONT (MISCELLANEOUS) ×3 IMPLANT
BENZOIN TINCTURE PRP APPL 2/3 (GAUZE/BANDAGES/DRESSINGS) ×3 IMPLANT
BLADE CLIPPER SURG (BLADE) IMPLANT
BLADE SURG 11 STRL SS (BLADE) ×3 IMPLANT
BONE MATRIX OSTEOCEL PRO MED (Bone Implant) ×4 IMPLANT
BRUSH SCRUB EZ PLAIN DRY (MISCELLANEOUS) ×3 IMPLANT
BUR MATCHSTICK NEURO 3.0 LAGG (BURR) ×3 IMPLANT
BUR PRECISION FLUTE 6.0 (BURR) ×3 IMPLANT
CAGE COROENT LG 10X9X23-12 (Cage) ×2 IMPLANT
CAGE COROENT LRG 9X9X28-8 (Cage) ×2 IMPLANT
CANISTER SUCT 3000ML PPV (MISCELLANEOUS) ×3 IMPLANT
CLOSURE WOUND 1/2 X4 (GAUZE/BANDAGES/DRESSINGS) ×2
CONN RELINE O CO 20 F/5/6 (Connector) ×2 IMPLANT
CONNECTOR RELINE CLSD OFFST 20 (Connector) ×6 IMPLANT
CONNECTOR RELINE O CO 20 F/5/6 (Connector) IMPLANT
CONT SPEC 4OZ CLIKSEAL STRL BL (MISCELLANEOUS) ×6 IMPLANT
COVER BACK TABLE 60X90IN (DRAPES) ×12 IMPLANT
DECANTER SPIKE VIAL GLASS SM (MISCELLANEOUS) ×3 IMPLANT
DIGITIZER BENDINI (MISCELLANEOUS) ×2 IMPLANT
DRAPE C-ARM 42X72 X-RAY (DRAPES) ×5 IMPLANT
DRAPE C-ARMOR (DRAPES) ×1 IMPLANT
DRAPE LAPAROTOMY 100X72X124 (DRAPES) ×3 IMPLANT
DRAPE POUCH INSTRU U-SHP 10X18 (DRAPES) ×3 IMPLANT
DRAPE PROXIMA HALF (DRAPES) IMPLANT
DRAPE SCAN PATIENT (DRAPES) ×3 IMPLANT
DRAPE SURG 17X23 STRL (DRAPES) ×3 IMPLANT
DRSG OPSITE 4X5.5 SM (GAUZE/BANDAGES/DRESSINGS) ×6 IMPLANT
DRSG OPSITE POSTOP 3X4 (GAUZE/BANDAGES/DRESSINGS) ×2 IMPLANT
DRSG OPSITE POSTOP 4X10 (GAUZE/BANDAGES/DRESSINGS) ×2 IMPLANT
DRSG OPSITE POSTOP 4X6 (GAUZE/BANDAGES/DRESSINGS) ×2 IMPLANT
DURAPREP 26ML APPLICATOR (WOUND CARE) ×3 IMPLANT
ELECT REM PT RETURN 9FT ADLT (ELECTROSURGICAL) ×3
ELECTRODE REM PT RTRN 9FT ADLT (ELECTROSURGICAL) ×1 IMPLANT
EVACUATOR 3/16  PVC DRAIN (DRAIN) ×4
EVACUATOR 3/16 PVC DRAIN (DRAIN) ×1 IMPLANT
GAUZE SPONGE 4X4 12PLY STRL (GAUZE/BANDAGES/DRESSINGS) ×3 IMPLANT
GAUZE SPONGE 4X4 16PLY XRAY LF (GAUZE/BANDAGES/DRESSINGS) ×4 IMPLANT
GLOVE BIO SURGEON STRL SZ 6.5 (GLOVE) ×3 IMPLANT
GLOVE BIO SURGEON STRL SZ7 (GLOVE) ×6 IMPLANT
GLOVE BIO SURGEON STRL SZ8 (GLOVE) ×6 IMPLANT
GLOVE BIO SURGEONS STRL SZ 6.5 (GLOVE) ×3
GLOVE BIOGEL PI IND STRL 7.5 (GLOVE) IMPLANT
GLOVE BIOGEL PI IND STRL 8.5 (GLOVE) IMPLANT
GLOVE BIOGEL PI INDICATOR 7.5 (GLOVE) ×2
GLOVE BIOGEL PI INDICATOR 8.5 (GLOVE) ×4
GLOVE ECLIPSE 8.5 STRL (GLOVE) ×4 IMPLANT
GLOVE EXAM NITRILE LRG STRL (GLOVE) IMPLANT
GLOVE EXAM NITRILE MD LF STRL (GLOVE) IMPLANT
GLOVE EXAM NITRILE XL STR (GLOVE) IMPLANT
GLOVE EXAM NITRILE XS STR PU (GLOVE) IMPLANT
GLOVE INDICATOR 8.5 STRL (GLOVE) ×6 IMPLANT
GOWN STRL REUS W/ TWL LRG LVL3 (GOWN DISPOSABLE) IMPLANT
GOWN STRL REUS W/ TWL XL LVL3 (GOWN DISPOSABLE) ×2 IMPLANT
GOWN STRL REUS W/TWL 2XL LVL3 (GOWN DISPOSABLE) ×2 IMPLANT
GOWN STRL REUS W/TWL LRG LVL3 (GOWN DISPOSABLE) ×12
GOWN STRL REUS W/TWL XL LVL3 (GOWN DISPOSABLE) ×15
KIT BASIN OR (CUSTOM PROCEDURE TRAY) ×3 IMPLANT
KIT INFUSE SMALL (Orthopedic Implant) ×2 IMPLANT
KIT ROOM TURNOVER OR (KITS) ×3 IMPLANT
LIQUID BAND (GAUZE/BANDAGES/DRESSINGS) ×3 IMPLANT
MARKER SPHERE PSV REFLC 13MM (MARKER) ×21 IMPLANT
MASTERGRAFT MATX STRIP 24CC (Orthopedic Implant) ×3 IMPLANT
MASTERGRAFT STRIP 10CM ×3 IMPLANT
MATRIX MASTERGRAFT STRIP 10CM IMPLANT
MATRIX MASTERGRAFT STRIP 24CC (Orthopedic Implant) IMPLANT
NDL HYPO 25X1 1.5 SAFETY (NEEDLE) ×1 IMPLANT
NEEDLE HYPO 25X1 1.5 SAFETY (NEEDLE) ×3 IMPLANT
NS IRRIG 1000ML POUR BTL (IV SOLUTION) ×3 IMPLANT
PACK LAMINECTOMY NEURO (CUSTOM PROCEDURE TRAY) ×3 IMPLANT
PAD ARMBOARD 7.5X6 YLW CONV (MISCELLANEOUS) ×15 IMPLANT
ROD RELINE COCR 5.5X500 STRT (Rod) ×4 IMPLANT
SCREW LOCK RELINE 5.5 TULIP (Screw) ×40 IMPLANT
SCREW LOCK RELINE CLOSED TULIP (Screw) ×4 IMPLANT
SCREW RELINE ILIAC 8.5X70 2S (Screw) ×4 IMPLANT
SCREW RELINE-O POLY 5.0X45MM (Screw) ×4 IMPLANT
SCREW RELINE-O POLY 5.5X40 (Screw) ×4 IMPLANT
SCREW RELINE-O POLY 5.5X45MM (Screw) ×6 IMPLANT
SCREW RELINE-O POLY 6.5X40 (Screw) ×6 IMPLANT
SCREW RELINE-O POLY 6.5X45 (Screw) ×12 IMPLANT
SCREW RELINE-O POLY 7.5X40 (Screw) ×4 IMPLANT
SPONGE LAP 4X18 X RAY DECT (DISPOSABLE) ×2 IMPLANT
SPONGE SURGIFOAM ABS GEL 100 (HEMOSTASIS) ×3 IMPLANT
STRIP CLOSURE SKIN 1/2X4 (GAUZE/BANDAGES/DRESSINGS) ×4 IMPLANT
SUT VIC AB 0 CT1 18XCR BRD8 (SUTURE) ×1 IMPLANT
SUT VIC AB 0 CT1 8-18 (SUTURE) ×3
SUT VIC AB 2-0 CT1 18 (SUTURE) ×3 IMPLANT
SUT VICRYL 4-0 PS2 18IN ABS (SUTURE) ×3 IMPLANT
SYR 20ML ECCENTRIC (SYRINGE) ×3 IMPLANT
TOWEL OR 17X24 6PK STRL BLUE (TOWEL DISPOSABLE) ×3 IMPLANT
TOWEL OR 17X26 10 PK STRL BLUE (TOWEL DISPOSABLE) ×3 IMPLANT
TRAY FOLEY CATH 14FRSI W/METER (CATHETERS) ×3 IMPLANT
WATER STERILE IRR 1000ML POUR (IV SOLUTION) ×3 IMPLANT

## 2014-06-24 NOTE — Progress Notes (Signed)
Patient ID: Stephen Rowe, male   DOB: September 07, 1940, 74 y.o.   MRN: [id number] Patient is ready for ORs neurologically unchanged no leg pain condition of back pain strength is 5 out of 5 in his lower cavities.  I've extensively gone over the risks and benefits of a T10 to ilium fusion including decompressive laminectomies and foraminotomies at L4-5 and L5-S1. I've gone over the risks and benefits perioperative course expectations of outcome turns to surgery the patient understands and agrees to proceed forward.

## 2014-06-24 NOTE — Anesthesia Preprocedure Evaluation (Addendum)
Anesthesia Evaluation  Patient identified by MRN, date of birth, ID band Patient awake    Reviewed: Allergy & Precautions, NPO status , Patient's Chart, lab work & pertinent test results  Airway Mallampati: II  TM Distance: >3 FB Neck ROM: Full    Dental no notable dental hx. (+) Teeth Intact, Dental Advisory Given   Pulmonary neg pulmonary ROS, former smoker,  breath sounds clear to auscultation  Pulmonary exam normal       Cardiovascular + CAD negative cardio ROS Normal cardiovascular examRhythm:Regular Rate:Normal  Cardiologist is Dr. Loni Beckwith with [facility]. He was seen on 06/19/14 by Benito Mccreedy, NP-C/Dr. Elonda Husky for follow-up and pre-operative evaluation. He was felt to be low CV risk. Her note states, "He has nonobstructive coronary artery disease by cardiac catheterization in 2004 in his LAD which was unchanged from previous cardiac catheterization in 1998." He reports that his last stress test was > 5 years ago.   Neuro/Psych negative neurological ROS  negative psych ROS   GI/Hepatic negative GI ROS, Neg liver ROS,   Endo/Other  negative endocrine ROS  Renal/GU negative Renal ROS  negative genitourinary   Musculoskeletal negative musculoskeletal ROS (+)   Abdominal   Peds negative pediatric ROS (+)  Hematology negative hematology ROS (+)   Anesthesia Other Findings   Reproductive/Obstetrics negative OB ROS                           Anesthesia Physical Anesthesia Plan  ASA: II  Anesthesia Plan: General   Post-op Pain Management:    Induction: Intravenous  Airway Management Planned: Oral ETT  Additional Equipment:   Intra-op Plan:   Post-operative Plan: Extubation in OR  Informed Consent: I have reviewed the patients History and Physical, chart, labs and discussed the procedure including the risks, benefits and alternatives for the proposed  anesthesia with the patient or authorized representative who has indicated his/her understanding and acceptance.   Dental advisory given  Plan Discussed with: CRNA and Surgeon  Anesthesia Plan Comments:         Anesthesia Quick Evaluation

## 2014-06-24 NOTE — Anesthesia Procedure Notes (Signed)
Procedure Name: Intubation Date/Time: 06/24/2014 2:43 PM Performed by: Jenne Campus Pre-anesthesia Checklist: Patient identified, Emergency Drugs available, Suction available, Patient being monitored and Timeout performed Patient Re-evaluated:Patient Re-evaluated prior to inductionOxygen Delivery Method: Circle system utilized Preoxygenation: Pre-oxygenation with 100% oxygen Intubation Type: IV induction Ventilation: Mask ventilation without difficulty Laryngoscope Size: Slavens and 2 Grade View: Grade I Tube type: Oral Tube size: 7.5 mm Number of attempts: 1 Airway Equipment and Method: Stylet Placement Confirmation: ETT inserted through vocal cords under direct vision,  positive ETCO2,  CO2 detector and breath sounds checked- equal and bilateral Secured at: 22 cm Tube secured with: Tape Dental Injury: Teeth and Oropharynx as per pre-operative assessment

## 2014-06-24 NOTE — Progress Notes (Signed)
Patient is taken to surgery at this time. Alert and in stable condition.

## 2014-06-25 ENCOUNTER — Inpatient Hospital Stay (HOSPITAL_COMMUNITY): Payer: Medicare Other

## 2014-06-25 ENCOUNTER — Encounter (HOSPITAL_COMMUNITY): Payer: Self-pay | Admitting: Neurosurgery

## 2014-06-25 DIAGNOSIS — J9600 Acute respiratory failure, unspecified whether with hypoxia or hypercapnia: Secondary | ICD-10-CM

## 2014-06-25 DIAGNOSIS — M4317 Spondylolisthesis, lumbosacral region: Secondary | ICD-10-CM

## 2014-06-25 LAB — POCT I-STAT 4, (NA,K, GLUC, HGB,HCT)
GLUCOSE: 172 mg/dL — AB (ref 65–99)
Glucose, Bld: 182 mg/dL — ABNORMAL HIGH (ref 65–99)
HEMATOCRIT: 38 % — AB (ref 39.0–52.0)
HEMATOCRIT: 39 % (ref 39.0–52.0)
Hemoglobin: 12.9 g/dL — ABNORMAL LOW (ref 13.0–17.0)
Hemoglobin: 13.3 g/dL (ref 13.0–17.0)
Potassium: 4.2 mmol/L (ref 3.5–5.1)
Potassium: 4.4 mmol/L (ref 3.5–5.1)
SODIUM: 137 mmol/L (ref 135–145)
Sodium: 136 mmol/L (ref 135–145)

## 2014-06-25 LAB — POCT I-STAT EG7
Acid-base deficit: 5 mmol/L — ABNORMAL HIGH (ref 0.0–2.0)
BICARBONATE: 20.8 meq/L (ref 20.0–24.0)
CALCIUM ION: 1.16 mmol/L (ref 1.13–1.30)
HEMATOCRIT: 36 % — AB (ref 39.0–52.0)
Hemoglobin: 12.2 g/dL — ABNORMAL LOW (ref 13.0–17.0)
O2 SAT: 98 %
Potassium: 4.7 mmol/L (ref 3.5–5.1)
Sodium: 137 mmol/L (ref 135–145)
TCO2: 22 mmol/L (ref 0–100)
pCO2, Ven: 41.3 mmHg — ABNORMAL LOW (ref 45.0–50.0)
pH, Ven: 7.313 — ABNORMAL HIGH (ref 7.250–7.300)
pO2, Ven: 117 mmHg — ABNORMAL HIGH (ref 30.0–45.0)

## 2014-06-25 LAB — BASIC METABOLIC PANEL
ANION GAP: 9 (ref 5–15)
Anion gap: 5 (ref 5–15)
BUN: 15 mg/dL (ref 6–20)
BUN: 15 mg/dL (ref 6–20)
CALCIUM: 7.8 mg/dL — AB (ref 8.9–10.3)
CHLORIDE: 102 mmol/L (ref 101–111)
CO2: 21 mmol/L — ABNORMAL LOW (ref 22–32)
CO2: 26 mmol/L (ref 22–32)
CREATININE: 0.94 mg/dL (ref 0.61–1.24)
CREATININE: 0.84 mg/dL (ref 0.61–1.24)
Calcium: 7.5 mg/dL — ABNORMAL LOW (ref 8.9–10.3)
Chloride: 105 mmol/L (ref 101–111)
GFR calc Af Amer: >60 mL/min (ref >60)
GFR calc Af Amer: >60 mL/min (ref >60)
GFR calc non Af Amer: >60 mL/min (ref >60)
GFR calc non Af Amer: >60 mL/min (ref >60)
Glucose, Bld: 194 mg/dL — ABNORMAL HIGH (ref 65–99)
Glucose, Bld: 182 mg/dL — ABNORMAL HIGH (ref 65–99)
POTASSIUM: 3.9 mmol/L (ref 3.5–5.1)
Potassium: 5 mmol/L (ref 3.5–5.1)
SODIUM: 135 mmol/L (ref 135–145)
Sodium: 133 mmol/L — ABNORMAL LOW (ref 135–145)

## 2014-06-25 LAB — BLOOD GAS, ARTERIAL
Acid-base deficit: 3.2 mmol/L — ABNORMAL HIGH (ref 0.0–2.0)
Bicarbonate: 20.8 mEq/L (ref 20.0–24.0)
DRAWN BY: 312761
FIO2: 0.4 %
MECHVT: 600 mL
O2 Saturation: 97.1 %
PATIENT TEMPERATURE: 99
PEEP: 5 cmH2O
RATE: 15 resp/min
TCO2: 21.9 mmol/L (ref 0–100)
pCO2 arterial: 34.6 mmHg — ABNORMAL LOW (ref 35.0–45.0)
pH, Arterial: 7.398 (ref 7.350–7.450)
pO2, Arterial: 91.9 mmHg (ref 80.0–100.0)

## 2014-06-25 LAB — GLUCOSE, CAPILLARY
GLUCOSE-CAPILLARY: 200 mg/dL — AB (ref 65–99)
GLUCOSE-CAPILLARY: 203 mg/dL — AB (ref 65–99)
GLUCOSE-CAPILLARY: 175 mg/dL — AB (ref 65–99)
Glucose-Capillary: 193 mg/dL — ABNORMAL HIGH (ref 65–99)
Glucose-Capillary: 132 mg/dL — ABNORMAL HIGH (ref 65–99)

## 2014-06-25 LAB — TRIGLYCERIDES: Triglycerides: 57 mg/dL (ref <150)

## 2014-06-25 LAB — CBC
HCT: 38.5 % — ABNORMAL LOW (ref 39.0–52.0)
HEMOGLOBIN: 13.2 g/dL (ref 13.0–17.0)
MCH: 29.8 pg (ref 26.0–34.0)
MCHC: 34.3 g/dL (ref 30.0–36.0)
MCV: 86.9 fL (ref 78.0–100.0)
Platelets: 131 10*3/uL — ABNORMAL LOW (ref 150–400)
RBC: 4.43 MIL/uL (ref 4.22–5.81)
RDW: 12.6 % (ref 11.5–15.5)
WBC: 12.6 10*3/uL — ABNORMAL HIGH (ref 4.0–10.5)

## 2014-06-25 LAB — TROPONIN I
Troponin I: <0.03 ng/mL (ref <0.031)
Troponin I: <0.03 ng/mL (ref <0.031)

## 2014-06-25 LAB — MAGNESIUM: MAGNESIUM: 1.8 mg/dL (ref 1.7–2.4)

## 2014-06-25 MED ORDER — ONDANSETRON HCL 4 MG/2ML IJ SOLN
INTRAMUSCULAR | Status: DC | PRN
  Administered 2014-06-25: 4 mg via INTRAVENOUS

## 2014-06-25 MED ORDER — SODIUM CHLORIDE 0.9 % IJ SOLN: 3.0000 mL | INTRAMUSCULAR | Status: DC | PRN

## 2014-06-25 MED ORDER — PHENOL 1.4 % MT LIQD: 1.0000 | OROMUCOSAL | Status: DC | PRN

## 2014-06-25 MED ORDER — MENTHOL 3 MG MT LOZG: 1.0000 | LOZENGE | OROMUCOSAL | Status: DC | PRN

## 2014-06-25 MED ORDER — ACETAMINOPHEN 325 MG PO TABS: 650.0000 mg | ORAL_TABLET | ORAL | Status: DC | PRN

## 2014-06-25 MED ORDER — BUPIVACAINE LIPOSOME 1.3 % IJ SUSP
20.0000 mL | INTRAMUSCULAR | Status: DC
  Filled 2014-06-25: qty 20

## 2014-06-25 MED ORDER — CYCLOBENZAPRINE HCL 10 MG PO TABS
10.0000 mg | ORAL_TABLET | Freq: Three times a day (TID) | ORAL | Status: DC | PRN
  Filled 2014-06-25 (×2): qty 1

## 2014-06-25 MED ORDER — INSULIN ASPART 100 UNIT/ML ~~LOC~~ SOLN
0.0000 [IU] | SUBCUTANEOUS | Status: DC
Start: 2014-06-25 — End: 2014-06-26
  Administered 2014-06-25: 2 [IU] via SUBCUTANEOUS
  Administered 2014-06-25 (×2): 3 [IU] via SUBCUTANEOUS
  Administered 2014-06-25: 5 [IU] via SUBCUTANEOUS
  Administered 2014-06-25: 3 [IU] via SUBCUTANEOUS
  Administered 2014-06-26: 2 [IU] via SUBCUTANEOUS
  Administered 2014-06-26: 3 [IU] via SUBCUTANEOUS
  Administered 2014-06-26: 2 [IU] via SUBCUTANEOUS

## 2014-06-25 MED ORDER — ACETAMINOPHEN 650 MG RE SUPP: 650.0000 mg | RECTAL | Status: DC | PRN

## 2014-06-25 MED ORDER — PROMETHAZINE HCL 25 MG/ML IJ SOLN
6.2500 mg | INTRAMUSCULAR | Status: DC | PRN
Start: 2014-06-25 — End: 2014-06-25

## 2014-06-25 MED ORDER — VANCOMYCIN HCL 1000 MG IV SOLR
INTRAVENOUS | Status: DC | PRN
  Administered 2014-06-25: 1000 mg

## 2014-06-25 MED ORDER — HYDROMORPHONE HCL 1 MG/ML IJ SOLN: 0.2500 mg | INTRAMUSCULAR | Status: DC | PRN

## 2014-06-25 MED ORDER — OXYCODONE-ACETAMINOPHEN 5-325 MG PO TABS
1.0000 | ORAL_TABLET | ORAL | Status: DC | PRN
  Administered 2014-06-25 – 2014-06-26 (×3): 2 via ORAL
  Filled 2014-06-25 (×3): qty 2

## 2014-06-25 MED ORDER — PROPOFOL 1000 MG/100ML IV EMUL
0.0000 ug/kg/min | INTRAVENOUS | Status: DC
  Administered 2014-06-25: 5 ug/kg/min via INTRAVENOUS

## 2014-06-25 MED ORDER — CETYLPYRIDINIUM CHLORIDE 0.05 % MT LIQD
7.0000 mL | Freq: Four times a day (QID) | OROMUCOSAL | Status: DC
  Administered 2014-06-25: 7 mL via OROMUCOSAL

## 2014-06-25 MED ORDER — ALUM & MAG HYDROXIDE-SIMETH 200-200-20 MG/5ML PO SUSP: 30.0000 mL | Freq: Four times a day (QID) | ORAL | Status: DC | PRN

## 2014-06-25 MED ORDER — PANTOPRAZOLE SODIUM 40 MG IV SOLR
40.0000 mg | Freq: Every day | INTRAVENOUS | Status: DC
  Filled 2014-06-25: qty 40

## 2014-06-25 MED ORDER — SODIUM CHLORIDE 0.9 % IJ SOLN
3.0000 mL | Freq: Two times a day (BID) | INTRAMUSCULAR | Status: DC
Start: 2014-06-25 — End: 2014-06-25

## 2014-06-25 MED ORDER — SODIUM CHLORIDE 0.9 % IV SOLN: 250.0000 mL | INTRAVENOUS | Status: DC

## 2014-06-25 MED ORDER — SODIUM CHLORIDE 0.9 % IV SOLN
INTRAVENOUS | Status: DC
  Administered 2014-06-25 – 2014-06-26 (×2): via INTRAVENOUS

## 2014-06-25 MED ORDER — CHLORHEXIDINE GLUCONATE 0.12 % MT SOLN
15.0000 mL | Freq: Two times a day (BID) | OROMUCOSAL | Status: DC
  Administered 2014-06-25: 15 mL via OROMUCOSAL
  Filled 2014-06-25: qty 15

## 2014-06-25 MED ORDER — ONDANSETRON HCL 4 MG/2ML IJ SOLN
4.0000 mg | INTRAMUSCULAR | Status: DC | PRN
  Administered 2014-06-25: 4 mg via INTRAVENOUS
  Filled 2014-06-25: qty 2

## 2014-06-25 MED ORDER — HYDROMORPHONE HCL 1 MG/ML IJ SOLN
0.5000 mg | INTRAMUSCULAR | Status: DC | PRN
  Administered 2014-06-25: 1 mg via INTRAVENOUS
  Administered 2014-06-25: 0.5 mg via INTRAVENOUS
  Administered 2014-06-25 (×3): 1 mg via INTRAVENOUS
  Administered 2014-06-25 (×2): 0.5 mg via INTRAVENOUS
  Administered 2014-06-26 – 2014-07-02 (×9): 1 mg via INTRAVENOUS
  Filled 2014-06-25 (×14): qty 1

## 2014-06-25 MED ORDER — POTASSIUM CHLORIDE IN NACL 20-0.45 MEQ/L-% IV SOLN
INTRAVENOUS | Status: DC
  Administered 2014-06-25: 03:00:00 via INTRAVENOUS
  Filled 2014-06-25 (×3): qty 1000

## 2014-06-25 MED ORDER — CEFAZOLIN SODIUM-DEXTROSE 2-3 GM-% IV SOLR
2.0000 g | Freq: Three times a day (TID) | INTRAVENOUS | Status: AC
  Administered 2014-06-25 – 2014-06-26 (×6): 2 g via INTRAVENOUS
  Filled 2014-06-25 (×6): qty 50

## 2014-06-25 MED ORDER — BUPIVACAINE LIPOSOME 1.3 % IJ SUSP
INTRAMUSCULAR | Status: DC | PRN
  Administered 2014-06-25: 20 mL

## 2014-06-25 NOTE — H&P (Signed)
PULMONARY / CRITICAL CARE MEDICINE   Name: Stephen Rowe MRN: [id number] DOB: 1940/03/26    ADMISSION DATE:  06/21/2014 CONSULTATION DATE:  06/25/2014  REFERRING MD :  Dr. Saintclair Halsted  CHIEF COMPLAINT:  Respiratory failure s/p spinal surgery  INITIAL PRESENTATION: 74 year old male presented for 2 stage spinal surgery 06/21/2014. Stage 2 was performed 5/23>5/24. Patient was left on ventilator post operatively and transferred to ICU. PCCM to assist with vent management.   STUDIES:  CT L/T spine 5/5 > Thoracolumbar dextroscoliosis. Bilateral L5 pars defects with chronic grade 2 anterolisthesis of L5 on S1. Moderate multilevel disc degeneration without significant change from prior MRI as above.  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 Patient's failed all forms of conservative treatment with epidural steroid injections, facet blocks, anti-inflammatories, and physical therapy. He has had multiple hospitalizations and with his progressive clinical syndrome imaging and failure conservative treatment surgical intervention was recommended. He presented 5/20 for stage 1 of a 2 stage procedure. Stage one included the anterolateral interbody fusions at L2-3 and L3-4. This was without complication and he was doing well postoperatively. 5/23 he was taken for stage 2 which included T10 to ilium fusion including decompressive laminectomies and foraminotomies at L4-5 and L5-S1. Post operatively he remained on ventilator and was sent to ICU. PCCM to assist with vent, medical management.   PAST MEDICAL HISTORY :   has a past medical history of  Arthritis; Prostatitis; Hyperlipidemia; History of kidney stones; GERD (gastroesophageal reflux disease); PONV (postoperative nausea and vomiting); Bulging disc; Prostate nodule; Skin cancer of face; and CAD (coronary artery disease).  has past surgical history that includes Kidney stone surgery; Rotator cuff repair; Cholecystectomy; Knee surgery; Hand surgery; Skin cancer excision; Colonoscopy (11/18/2011); Knee surgery (Right); Joint replacement; and Cardiac catheterization. Prior to Admission medications   Medication Sig Start Date End Date Taking? Authorizing Provider  aspirin EC 81 MG tablet Take 81 mg by mouth daily.   Yes Historical Provider, MD  diazepam (VALIUM) 10 MG tablet Take 10 mg by mouth every 6 (six) hours as needed (Muscle spasm). 07/23/13  Yes Daleen Bo, MD  rosuvastatin (CRESTOR) 10 MG tablet Take 10 mg by mouth daily.   Yes Historical Provider, MD  oxyCODONE-acetaminophen (PERCOCET/ROXICET) 5-325 MG per tablet Take 1 tablet by mouth every 4 (four) hours as needed for severe pain. 08/01/13   Thurnell Lose, MD   No Known Allergies  FAMILY HISTORY:  has no family status information on file.  SOCIAL HISTORY:  reports that he has quit smoking. His smoking use included Cigarettes. He has a 4 pack-year smoking history. He does not have any smokeless tobacco history on file. He reports that he does not drink alcohol or use illicit drugs.  REVIEW OF SYSTEMS:  unable  SUBJECTIVE:   VITAL SIGNS: Temp:  [97.5 F (36.4 C)-98.4 F (36.9 C)] 97.5 F (36.4 C) (05/23 1118) Pulse Rate:  [75-89] 88 (05/23 1432) Resp:  [18] 18 (05/23 1118) BP: (116-130)/(64-77) 128/64 mmHg (05/23 1118) SpO2:  [92 %-96 %] 96 % (05/23 1118) FiO2 (%):  [40 %] 40 % (05/24 0250)  HEMODYNAMICS:   VENTILATOR SETTINGS: Vent Mode:  [-] PRVC FiO2 (%):  [40 %] 40 % Set Rate:  [15 bmp] 15 bmp Vt Set:  [600 mL] 600 mL PEEP:  [5 cmH20] 5 cmH20 INTAKE / OUTPUT:  Intake/Output Summary (Last 24 hours) at  06/25/14 0304 Last data filed at 06/25/14 0205  Gross per 24 hour  Intake   7605 ml  Output   3525 ml  Net   4080 ml    PHYSICAL EXAMINATION: General:  Elderly male of normal body habitus on vent Neuro:  Sedated on vent, moving all 4 extremities HEENT:  [place]/AT, PERRL, no JVD Cardiovascular:  Tachy, regular,  no MRG Lungs:  Clear Abdomen:  Soft, non-distended Musculoskeletal:  No acute deformity. Moving b/l upper extremities, non-purposeful.  Skin:  Grossly intact  LABS:  CBC  Recent Labs Lab 06/20/14 0931 06/24/14 2020 06/24/14 2136 06/24/14 2355  WBC 6.0  --   --   --   HGB 16.5 12.9* 13.3 12.2*  HCT 47.8 38.0* 39.0 36.0*  PLT 180  --   --   --    Coag's No results for input(s): APTT, INR in the last 168 hours. BMET  Recent Labs Lab 06/20/14 0931 06/24/14 2020 06/24/14 2136 06/24/14 2355  NA 137 136 137 137  K 4.2 4.2 4.4 4.7  CL 105  --   --   --   CO2 24  --   --   --   BUN 14  --   --   --   CREATININE 0.92  --   --   --   GLUCOSE 123* 172* 182*  --    Electrolytes  Recent Labs Lab 06/20/14 0931  CALCIUM 9.4   Sepsis Markers No results for input(s): LATICACIDVEN, PROCALCITON, O2SATVEN in the last 168 hours. ABG No results for input(s): PHART, PCO2ART, PO2ART in the last 168 hours. Liver Enzymes No results for input(s): AST, ALT, ALKPHOS, BILITOT, ALBUMIN in the last 168 hours. Cardiac Enzymes No results for input(s): TROPONINI, PROBNP in the last 168 hours. Glucose No results for input(s): GLUCAP in the last 168 hours.  Imaging Dg Lumbar Spine 2-3 Views  06/25/2014   CLINICAL DATA:  Spinal fusion.  EXAM: DG C-ARM GT 120 MIN; LUMBAR SPINE - 2-3 VIEW  COMPARISON:  Lumbar spine CT 06/06/2014  FINDINGS: Discectomy grafts present at L2-3, L3-4, L4-5, and L5-S1. No evidence of graft displacement to the canal. There is chronic grade 2 L5-S1 anterolisthesis. There are a lower thoracic and lumbar pedicle screws without evidence of extraosseous  placement. Sacroiliac screws also noted. No evidence of intraoperative spinal fracture.  IMPRESSION: Fluoroscopy for L2-3, L3-4, L4-5, and L5-S1 discectomies and thoracic to sacral fixation.   Electronically Signed   By: Monte Fantasia M.D.   On: 06/25/2014 01:39   Dg C-arm Gt 120 Min  06/25/2014   CLINICAL DATA:  Spinal fusion.  EXAM: DG C-ARM GT 120 MIN; LUMBAR SPINE - 2-3 VIEW  COMPARISON:  Lumbar spine CT 06/06/2014  FINDINGS: Discectomy grafts present at L2-3, L3-4, L4-5, and L5-S1. No evidence of graft displacement to the canal. There is chronic grade 2 L5-S1 anterolisthesis. There are a lower thoracic and lumbar pedicle screws without evidence of extraosseous placement. Sacroiliac screws also noted. No evidence of intraoperative spinal fracture.  IMPRESSION: Fluoroscopy for L2-3, L3-4, L4-5, and L5-S1 discectomies and thoracic to sacral fixation.   Electronically Signed   By: Monte Fantasia M.D.   On: 06/25/2014 01:39  ASSESSMENT / PLAN:  PULMONARY OETT 5/23 >>> A: Acuter respiratory failure in post op setting  P:   Full support SBT in AM, hope to extubate CXR for ETT placement ABG VAP bundle  CARDIOVASCULAR A:  Hypotension  P:  Tele monitoring Wean propofol as able May need some low dose phenylephrine overnight  Resume crestor once taking PO Resume ASA when ok with neurosurgery  RENAL A:   No known issues  P:   Repeat Bmet IVF @ 100/hr  GASTROINTESTINAL A:   No acute issues  P:   SUP: IV protonix NPO for now  HEMATOLOGIC A:   No acute issues  P:  Follow CBC  INFECTIOUS A:   No acute issues  P:   periop ancef  ENDOCRINE A:  Hyperglycemia with no history of DM  P:   Monitor Add SSI if consistently greater than 180  NEUROLOGIC A:   S/p 2 phase spinal surgery (anterolateral interbody fusions at L2-3 and L3-4.  T10 to ilium fusion including decompressive laminectomies and foraminotomies at L4-5 and L5-S1) Pain management P:   RASS  goal: -1 Propofol for sedation Dilaudid per primary for analgesia Monitor Neurochecks   FAMILY  - Updates:   - Inter-disciplinary family meet or Palliative Care meeting due by:  07/01/2014   Georgann Housekeeper, AGACNP-BC [place] [facility] Pager [phone] or [phone]  06/25/2014 3:26 AM   PCCM ATTENDING: I have reviewed pt's initial presentation, consultants notes and hospital database in detail.  The above assessment and plan was formulated under my direction.  In summary: Remained intubated after very prolonged surgery. Passed SBT this AM and extubated. Looks good post extubation   Merton Border, MD;  PCCM service; Mobile [phone]

## 2014-06-25 NOTE — Progress Notes (Signed)
Chaplain initiated visit with pt and family. Chaplain knows pt daughter from faith community. Chaplain offered prayer at pt request. Page chaplain as needed.    06/25/14 1100  Clinical Encounter Type  Visited With Patient and family together  Visit Type Initial;Spiritual support  Spiritual Encounters  Spiritual Needs Emotional;Prayer  Stress Factors  Patient Stress Factors Health changes  Stephen Rowe, Barbette Hair, Chaplain 06/25/2014 12:00 PM

## 2014-06-25 NOTE — Progress Notes (Signed)
eLink Physician-Brief Progress Note Patient Name: Stephen Rowe DOB: 07-Nov-1940 MRN: [id number]   Date of Service  06/25/2014  HPI/Events of Note  6 M s/p spinal surgery.  Request from primary surgeon to assist with vent and medical management of patient.  eICU Interventions  Vent orders F/U on ABG and pCXR Sedation with propofol IV as anticipate extubation in AM PRNs for pain already written by primary team.     Intervention Category Intermediate Interventions: Other:  DETERDING,ELIZABETH 06/25/2014, 2:48 AM

## 2014-06-25 NOTE — Care Management Note (Signed)
Case Management Note  Patient Details  Name: Stephen Rowe MRN: [id number] Date of Birth: [birth year]  Subjective/Objective:   Pt s/p  PLIF - L4-L5 - L5-S1, posterior fusion T10-ilium with Airo on 5/23-5/24.  PTA, pt independent, resides at home with spouse.              Action/Plan: Will follow for dc planning.  UR completed.  Await therapy evaluations to determine next level of care needed.    Expected Discharge Date:                  Expected Discharge Plan:  [place]  In-House Referral:     Discharge planning Services  CM Consult  Post Acute Care Choice:    Choice offered to:     DME Arranged:    DME Agency:     HH Arranged:    [place] Agency:     Status of Service:  In process, will continue to follow  Medicare Important Message Given:  Yes Date Medicare IM Given:  06/24/14 Medicare IM give by:  Brandt Loosen Date Additional Medicare IM Given:    Additional Medicare Important Message give by:     If discussed at [place] of Stay Meetings, dates discussed:    Additional Comments:  Reinaldo Raddle, RN, BSN  Trauma/Neuro ICU Case Manager [phone]

## 2014-06-25 NOTE — Clinical Social Work Note (Signed)
Clinical Social Worker received standing order referral for possible ST-SNF placement.  Chart reviewed.  Patient currently on ventilator.  CSW signing off - please re consult when appropriate.  Barbette Or, [place]

## 2014-06-25 NOTE — Evaluation (Signed)
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 Patient's failed all forms of conservative treatment with epidural steroid injections, facet blocks, anti-inflammatories, and physical therapy. He has had multiple hospitalizations and with his progressive clinical syndrome imaging and failure conservative treatment surgical intervention was recommended. He presented 5/20 for stage 1 of a 2 stage procedure. Stage one included the anterolateral interbody fusions at L2-3 and L3-4. This was without complication and he was doing well postoperatively. 5/23 he was taken for stage 2 which included T10 to ilium fusion including decompressive laminectomies and foraminotomies at L4-5 and L5-S1. Post operatively he remained on ventilator and was sent to ICU. PCCM to assist with vent, medical management. PMHx: rotator cuff repair, TKR, chole  Clinical Impression  Pt admitted with/for 2 stage lumbar fusion/decompression to improve scoliosis.  Pt currently limited functionally due to the problems listed. ( See problems list.)   Pt will benefit from PT to maximize function and safety in order to get ready for next venue listed below.     Follow Up Recommendations CIR    Equipment Recommendations  Other (comment) (TBA)    Recommendations for Other Services       Precautions / Restrictions Precautions Precautions: Back;Fall Required Braces or Orthoses: Spinal Brace Spinal Brace: Lumbar corset;Applied in sitting position (but I put on in supine today to help decrease pain.)      Mobility  Bed Mobility Overal bed mobility: Needs Assistance;+2 for physical  assistance Bed Mobility: Rolling;Sidelying to Sit;Sit to Sidelying Rolling: Mod assist Sidelying to sit: Mod assist;+2 for physical assistance     Sit to sidelying: Mod assist;+2 for physical assistance General bed mobility comments: instructed in safest way to get to EOB--log roll and up from sidelying  Transfers                    Ambulation/Gait             General Gait Details: unable  Stairs            Wheelchair Mobility    Modified Rankin (Stroke Patients Only)       Balance Overall balance assessment: Needs assistance Sitting-balance support: Feet supported;Single extremity supported;Bilateral upper extremity supported Sitting balance-Leahy Scale: Poor Sitting balance - Comments: due to pain                                     Pertinent Vitals/Pain Pain Assessment: 0-10 Pain Score: 10-Worst pain ever (6/10 sitting EOB) Pain Location: back Pain Descriptors / Indicators: Constant;Operative site guarding;Sharp Pain Intervention(s): Limited activity within patient's tolerance;Monitored during session;Repositioned;Patient requesting pain meds-RN notified;Premedicated before session    Home Living Family/patient expects to be discharged to:: Private residence Living Arrangements: Spouse/significant other Available Help at Discharge: Family;Available 24 hours/day Type of Home: House Home Access: Stairs to enter Entrance Stairs-Rails: Psychiatric nurse of Steps: 2 Home Layout: One level Home Equipment: Walker - 2 wheels;Bedside commode;Shower seat      Prior Function Level of Independence: Independent               Hand Dominance  Extremity/Trunk Assessment   Upper Extremity Assessment: Defer to OT evaluation           Lower Extremity Assessment: Overall WFL for tasks assessed         Communication   Communication: No difficulties  Cognition Arousal/Alertness: Awake/alert Behavior  During Therapy: WFL for tasks assessed/performed Overall Cognitive Status: Within Functional Limits for tasks assessed                      General Comments General comments (skin integrity, edema, etc.): Initiated back education, but too painful to internalize much    Exercises        Assessment/Plan    PT Assessment Patient needs continued PT services  PT Diagnosis Generalized weakness;Acute pain   PT Problem List Decreased strength;Decreased activity tolerance;Decreased mobility;Decreased knowledge of precautions;Pain  PT Treatment Interventions DME instruction;Gait training;Stair training;Functional mobility training;Therapeutic activities;Patient/family education   PT Goals (Current goals can be found in the Care Plan section) Acute Rehab PT Goals Patient Stated Goal: back independent PT Goal Formulation: With patient Time For Goal Achievement: 07/09/14 Potential to Achieve Goals: Good    Frequency Min 5X/week   Barriers to discharge        Co-evaluation               End of Session   Activity Tolerance: Patient tolerated treatment well;Patient limited by pain;Other (comment) (limited by drop in BP and increase in HR) Patient left: in bed;with call bell/phone within reach Nurse Communication: Mobility status         Time: 1420-1445 PT Time Calculation (min) (ACUTE ONLY): 25 min   Charges:   PT Evaluation $Initial PT Evaluation Tier I: 1 Procedure PT Treatments $Therapeutic Activity: 8-22 mins   PT G Codes:        Remmi Armenteros, Tessie Fass 06/25/2014, 3:10 PM  06/25/2014  Donnella Sham, PT [phone]  (pager)

## 2014-06-25 NOTE — Transfer of Care (Signed)
Immediate Anesthesia Transfer of Care Note  Patient: Stephen Rowe  Procedure(s) Performed: Procedure(s): PLIF - L4-L5 - L5-S1, posterior fusion T10-ilium with Airo (N/A) APPLICATION OF INTRAOPERATIVE CT SCAN (N/A)  Patient Location: SICU  Anesthesia Type:General  Level of Consciousness: Patient remains intubated per anesthesia plan  Airway & Oxygen Therapy: Patient remains intubated per anesthesia plan and Patient placed on Ventilator (see vital sign flow sheet for setting)  Post-op Assessment: Report given to RN and Post -op Vital signs reviewed and stable  Post vital signs: Reviewed and stable  Last Vitals:  Filed Vitals:   06/24/14 1118  BP: 128/64  Pulse: 79  Temp: 36.4 C  Resp: 18    Complications: No apparent anesthesia complications

## 2014-06-25 NOTE — Anesthesia Postprocedure Evaluation (Signed)
  Anesthesia Post-op Note  Patient: Stephen Rowe  Procedure(s) Performed: Procedure(s): PLIF - L4-L5 - L5-S1, posterior fusion T10-ilium with Airo (N/A) APPLICATION OF INTRAOPERATIVE CT SCAN (N/A)  Patient Location: NICU  Anesthesia Type:General  Level of Consciousness: sedated and Patient remains intubated per anesthesia plan  Airway and Oxygen Therapy: Patient remains intubated per anesthesia plan and Patient placed on Ventilator (see vital sign flow sheet for setting)  Post-op Pain: none  Post-op Assessment: Post-op Vital signs reviewed, Patient's Cardiovascular Status Stable, Respiratory Function Stable and Patent Airway  Post-op Vital Signs: stable  Last Vitals:  Filed Vitals:   06/25/14 0424  BP: 103/48  Pulse: 115  Temp: 37.2 C  Resp: 18    Complications: No apparent anesthesia complications

## 2014-06-25 NOTE — Progress Notes (Signed)
Asked by Dr Saintclair Halsted to address tachycardia.   12-lead Sinus tach w/ frequent PACs BP 138/54 mmHg  Pulse 60  Temp(Src) 98.7 F (37.1 C) (Oral)  Resp 25  Ht 5\' 10"  (1.778 m)  Wt 94.348 kg (208 lb)  BMI 29.84 kg/m2  SpO2 96%   Hemodynamically stable Denies any chest pain No results for input(s): LATICACIDVEN in the last 168 hours.  Recent Labs Lab 06/20/14 0931 06/24/14 2020 06/24/14 2136 06/24/14 2355 06/25/14 0512  NA 137 136 137 137 135  K 4.2 4.2 4.4 4.7 5.0  CL 105  --   --   --  105  CO2 24  --   --   --  21*  BUN 14  --   --   --  15  CREATININE 0.92  --   --   --  0.94  GLUCOSE 123* 172* 182*  --  194*    Recent Labs Lab 06/20/14 0931  06/24/14 2136 06/24/14 2355 06/25/14 0512  HGB 16.5  < > 13.3 12.2* 13.2  HCT 47.8  < > 39.0 36.0* 38.5*  WBC 6.0  --   --   --  12.6*  PLT 180  --   --   --  131*  < > = values in this interval not displayed.  ST w/ frequent PACs He is Hyperkalemic from am labs and was on KCL supplementation  Plan D/c KCL in IVFs Recheck chemistry including Mg/ also CBC ensure no occult bleeding  Cycle CEs   Erick Colace ACNP-BC [facility] Pager # [phone] OR # [phone] if no answer

## 2014-06-25 NOTE — Op Note (Signed)
Preoperative diagnosis: Grade 1 spondylolisthesis L5-S1, lumbar spinal stenosis L4-5, degenerative scoliosis and lumbar deformity L1-S1.  Postoperative diagnosis: Same  Procedure: #1 Gill decompression L5-S1 with radical foraminotomies of the L5 and S1 nerve roots as well as decompressive laminectomy at L4-5 and excess and requiring more work to would be needed with a standard interbody fusion  #2 posterior lumbar interbody fusion L4-5 L5-S1 using the new invasive peek cages packed with locally harvested autograft mixed with Osteocel  #3 pedicle screw fixation T10 to the ilium with 2 screws at T10, T11, T12, L1, L2, L3, L4, L5, S1 and 2 iliac bolts. Using the [employer] spinal navigation system  #4 posterior lateral arthrodesis T10-S1 using locally harvested autograft mixed with BMP ostia cell and master graft  #5 open reduction spinal deformity  #6 placement of a large Hemovac drain  Surgeon: Dominica Severin Catherine Cubero  Asst.: Kristeen Miss  Anesthesia: Gen.  EBL: Minimal  History of present illness: Patient is very pleasant 74 year old gentleman is a long-standing chronic low back pain and predominantly left leg pain workup revealed severe spinal stenosis from a grade 1 spinal listhesis L5-S1 and severe lumbar spinal stenosis and degenerative scoliosis L1-S1. Patient underwent the first stage of this procedure with the L2-3 and L3-4 anterior lateral interbody fusion on Friday and comes in today for the remainder the posterior part of the operation. I extensively went over the risks and benefits of the operation with the patient as well as perioperative course expectations of outcome alternatives of surgery and he understood and agreed to proceed forward.  Operative procedure: Patient brought into the or was induced under general anesthesia positioned prone on the [facility] table his back was shaved prepped and draped in routine sterile fashion. After the patient was adequately positioned posterior incision was  opened up after being up for tray with 10 mL lidocaine with epi and Bovie left car was used to calcification subperiosteal dissections care and lamina from T10 down to the sacrum. At this point a reference admit her was applied to the T 10 spinous process and patient was fed through the Brain[employer] aero posterior spinal navigation system and then readings were obtained and we used the stereotactic system to place pedicle screws from T10 down to L3. All screws had had excellent purchase. After placement of all the screws intense taken the interbody work and the decompressive laminectomy so a central decompression was begun at L4-5 and L5-S1 complete facetectomies were performed there was an isthmic spondee at L5-S1 with marked dystrophic facets extensive granulation tissue and severe compression of the L5 and S1 nerve roots I drilled off the inferomedial aspect of the pedicles at L5 and under better dense amount of granulation tissue decompressing the 5 root. Bleed medial facetectomies were performed and aggressive unamenable superior tickling facet a ladder axis the lateral margins of the disc space. Then working at L5-S1 with sequential distraction I opened up the disc space and significantly reduce the deformity. Then after adequate disc  Then cleaned out and endplate preparation been achieved lordotic cages were inserted after being packed with local autograft mixed. BMP and local autograft was packed centrally between the cages. This procedure was repeated at L4-5. Dislocation of his stent stenosis and marked foraminal stenosis from severe facet arthropathy. This basically was cleaned out and plates were prepared and interbody work was performed. Then attention was taken to placement of the remaining screws and using fluoroscopy at this point because in order to finish the decompression I had to  remove the reference better as it was in the way of the procedure. So we replaced the L4 L5 S1 with screws under  fluoroscopy. The ilial screws had been placed using utilizing the [employer] Aero earlier through a separate fascial incision.. After all the screws and placed the wound scope was irrigated meticulous hemostasis was maintained the Bandini rod contouring system was then utilized aggressive decortication along the laminar and TP complexes from the brachial lumbar spine and along the TPs was aggressively decorticated and the local autograft BMP master graft and osseous cell was all packed interlaminar in the thoracic spine and around the TPs and facet joints in the lumbar spine. The rods were then anchored in place tying into the ilial connectors and I utilized an invasive ilial closed-loop bolt and a connector and anchored everything in place and rechecked the foramina laid Gelfoam over the dura and placed large director and the wounds and closed in layers with interrupted Vicryls skin was closed running 4 subcuticular benzoin Steri-Strips are applied sterile dressing was applied patient recovered in stable condition. At the end of case all needle counts sponge counts were correct.

## 2014-06-25 NOTE — Progress Notes (Signed)
Subjective: Patient reports Doing well although remained intubated overnight denies any numbness and tingling in his legs  Objective: Vital signs in last 24 hours: Temp:  [97.1 F (36.2 C)-99 F (37.2 C)] 97.1 F (36.2 C) (05/24 0736) Pulse Rate:  [79-119] 117 (05/24 0800) Resp:  [13-18] 17 (05/24 0800) BP: (89-130)/(48-83) 102/83 mmHg (05/24 0800) SpO2:  [93 %-100 %] 100 % (05/24 0800) FiO2 (%):  [40 %] 40 % (05/24 0800)  Intake/Output from previous day: 05/23 0701 - 05/24 0700 In: 8026.8 [I.V.:6171.8; Blood:855; IV Piggyback:1000] Out: 7628 [Urine:1400; Blood:2475] Intake/Output this shift: Total I/O In: 105.7 [I.V.:105.7] Out: 290 [Urine:115; Drains:175]  Awake alert oriented strength out of 5 wound clean dry and intact  Lab Results:  Recent Labs  06/24/14 2355 06/25/14 0512  WBC  --  12.6*  HGB 12.2* 13.2  HCT 36.0* 38.5*  PLT  --  131*   BMET  Recent Labs  06/24/14 2136 06/24/14 2355 06/25/14 0512  NA 137 137 135  K 4.4 4.7 5.0  CL  --   --  105  CO2  --   --  21*  GLUCOSE 182*  --  194*  BUN  --   --  15  CREATININE  --   --  0.94  CALCIUM  --   --  7.8*    Studies/Results: Dg Lumbar Spine 2-3 Views  06/25/2014   CLINICAL DATA:  Spinal fusion.  EXAM: DG C-ARM GT 120 MIN; LUMBAR SPINE - 2-3 VIEW  COMPARISON:  Lumbar spine CT 06/06/2014  FINDINGS: Discectomy grafts present at L2-3, L3-4, L4-5, and L5-S1. No evidence of graft displacement to the canal. There is chronic grade 2 L5-S1 anterolisthesis. There are a lower thoracic and lumbar pedicle screws without evidence of extraosseous placement. Sacroiliac screws also noted. No evidence of intraoperative spinal fracture.  IMPRESSION: Fluoroscopy for L2-3, L3-4, L4-5, and L5-S1 discectomies and thoracic to sacral fixation.   Electronically Signed   By: Monte Fantasia M.D.   On: 06/25/2014 01:39   Portable Chest Xray  06/25/2014   CLINICAL DATA:  Encounter for intubation  EXAM: PORTABLE CHEST - 1 VIEW   COMPARISON:  None.  FINDINGS: Endotracheal tube tip is at the clavicular heads. An orogastric tube at least enters the stomach. Tube overlapping the left lower chest is likely a surgical drain.  Streaky lower lung opacities compatible with postoperative atelectasis. No edema, effusion, or air leak. Normal heart size and mediastinal contours.  IMPRESSION: 1. Endotracheal and orogastric tubes are in good position. 2. Mild bibasilar atelectasis.   Electronically Signed   By: Monte Fantasia M.D.   On: 06/25/2014 03:21   Dg C-arm Gt 120 Min  06/25/2014   CLINICAL DATA:  Spinal fusion.  EXAM: DG C-ARM GT 120 MIN; LUMBAR SPINE - 2-3 VIEW  COMPARISON:  Lumbar spine CT 06/06/2014  FINDINGS: Discectomy grafts present at L2-3, L3-4, L4-5, and L5-S1. No evidence of graft displacement to the canal. There is chronic grade 2 L5-S1 anterolisthesis. There are a lower thoracic and lumbar pedicle screws without evidence of extraosseous placement. Sacroiliac screws also noted. No evidence of intraoperative spinal fracture.  IMPRESSION: Fluoroscopy for L2-3, L3-4, L4-5, and L5-S1 discectomies and thoracic to sacral fixation.   Electronically Signed   By: Monte Fantasia M.D.   On: 06/25/2014 01:39    Assessment/Plan: Weaned to extubation this morning mobilized throughout the ICU with physical and occupational therapy once extubated.  LOS: 4 days     Anatalia Kronk P  06/25/2014, 8:26 AM

## 2014-06-25 NOTE — Procedures (Signed)
Extubation Procedure Note  Patient Details:   Name: Stephen Rowe DOB: 02-28-1940 MRN: [id number]   Airway Documentation: He had an audible cuff leak when deflated and extubated to 4lpm [place].  Strong productive cough.    Evaluation  O2 sats: stable throughout Complications: No apparent complications Patient did tolerate procedure well. Bilateral Breath Sounds: Clear   Yes patient stated his full name, said he was hot and his back hurt.  Vaughan Basta [place] 06/25/2014, 9:23 AM

## 2014-06-25 NOTE — Progress Notes (Signed)
eLink Physician-Brief Progress Note Patient Name: LEGION DISCHER DOB: 01/19/1941 MRN: [id number]   Date of Service  06/25/2014  HPI/Events of Note  Blood sugar of 200 with normal renal function  eICU Interventions  Placed on q4 hour moderate SSI     Intervention Category Intermediate Interventions: Hyperglycemia - evaluation and treatment  Holly Hill 06/25/2014, 4:42 AM

## 2014-06-26 ENCOUNTER — Inpatient Hospital Stay (HOSPITAL_COMMUNITY): Payer: Medicare Other

## 2014-06-26 ENCOUNTER — Encounter (HOSPITAL_COMMUNITY): Payer: Self-pay | Admitting: Neurosurgery

## 2014-06-26 DIAGNOSIS — R000 Tachycardia, unspecified: Secondary | ICD-10-CM

## 2014-06-26 DIAGNOSIS — R739 Hyperglycemia, unspecified: Secondary | ICD-10-CM

## 2014-06-26 DIAGNOSIS — I2510 Atherosclerotic heart disease of native coronary artery without angina pectoris: Secondary | ICD-10-CM

## 2014-06-26 DIAGNOSIS — I493 Ventricular premature depolarization: Secondary | ICD-10-CM

## 2014-06-26 LAB — CBC
HCT: 31.9 % — ABNORMAL LOW (ref 39.0–52.0)
HEMOGLOBIN: 11.2 g/dL — AB (ref 13.0–17.0)
MCH: 30.4 pg (ref 26.0–34.0)
MCHC: 35.1 g/dL (ref 30.0–36.0)
MCV: 86.4 fL (ref 78.0–100.0)
PLATELETS: 115 10*3/uL — AB (ref 150–400)
RBC: 3.69 MIL/uL — ABNORMAL LOW (ref 4.22–5.81)
RDW: 12.7 % (ref 11.5–15.5)
WBC: 10.5 10*3/uL (ref 4.0–10.5)

## 2014-06-26 LAB — BASIC METABOLIC PANEL
Anion gap: 5 (ref 5–15)
BUN: 13 mg/dL (ref 6–20)
CALCIUM: 7.4 mg/dL — AB (ref 8.9–10.3)
CHLORIDE: 101 mmol/L (ref 101–111)
CO2: 26 mmol/L (ref 22–32)
CREATININE: 0.82 mg/dL (ref 0.61–1.24)
GFR calc Af Amer: >60 mL/min (ref >60)
GLUCOSE: 136 mg/dL — AB (ref 65–99)
Potassium: 4.1 mmol/L (ref 3.5–5.1)
Sodium: 132 mmol/L — ABNORMAL LOW (ref 135–145)

## 2014-06-26 LAB — GLUCOSE, CAPILLARY
GLUCOSE-CAPILLARY: 144 mg/dL — AB (ref 65–99)
GLUCOSE-CAPILLARY: 151 mg/dL — AB (ref 65–99)
GLUCOSE-CAPILLARY: 172 mg/dL — AB (ref 65–99)
GLUCOSE-CAPILLARY: 190 mg/dL — AB (ref 65–99)
Glucose-Capillary: 136 mg/dL — ABNORMAL HIGH (ref 65–99)
Glucose-Capillary: 165 mg/dL — ABNORMAL HIGH (ref 65–99)

## 2014-06-26 LAB — TROPONIN I: Troponin I: <0.03 ng/mL (ref <0.031)

## 2014-06-26 MED ORDER — INSULIN ASPART 100 UNIT/ML ~~LOC~~ SOLN
0.0000 [IU] | Freq: Three times a day (TID) | SUBCUTANEOUS | Status: DC
  Administered 2014-06-26 – 2014-06-27 (×3): 3 [IU] via SUBCUTANEOUS
  Administered 2014-06-27: 5 [IU] via SUBCUTANEOUS
  Administered 2014-06-27: 3 [IU] via SUBCUTANEOUS
  Administered 2014-06-28: 2 [IU] via SUBCUTANEOUS
  Administered 2014-06-28: 3 [IU] via SUBCUTANEOUS
  Administered 2014-06-29: 5 [IU] via SUBCUTANEOUS
  Administered 2014-06-29: 1 [IU] via SUBCUTANEOUS
  Administered 2014-06-29 – 2014-06-30 (×2): 2 [IU] via SUBCUTANEOUS
  Administered 2014-07-01 (×2): 3 [IU] via SUBCUTANEOUS
  Administered 2014-07-02: 2 [IU] via SUBCUTANEOUS
  Administered 2014-07-02: 3 [IU] via SUBCUTANEOUS
  Administered 2014-07-02 – 2014-07-03 (×2): 2 [IU] via SUBCUTANEOUS

## 2014-06-26 MED ORDER — OXYCODONE HCL 5 MG PO TABS
10.0000 mg | ORAL_TABLET | ORAL | Status: DC | PRN
  Administered 2014-06-26 – 2014-06-28 (×7): 10 mg via ORAL
  Filled 2014-06-26 (×7): qty 2

## 2014-06-26 MED ORDER — DEXAMETHASONE SODIUM PHOSPHATE 10 MG/ML IJ SOLN
10.0000 mg | Freq: Four times a day (QID) | INTRAMUSCULAR | Status: AC
  Administered 2014-06-26 – 2014-06-27 (×4): 10 mg via INTRAVENOUS
  Filled 2014-06-26 (×4): qty 1

## 2014-06-26 MED ORDER — METOPROLOL TARTRATE 12.5 MG HALF TABLET
12.5000 mg | ORAL_TABLET | Freq: Two times a day (BID) | ORAL | Status: AC
  Administered 2014-06-26 – 2014-06-27 (×4): 12.5 mg via ORAL
  Filled 2014-06-26 (×5): qty 1

## 2014-06-26 MED ORDER — PANTOPRAZOLE SODIUM 40 MG PO TBEC
80.0000 mg | DELAYED_RELEASE_TABLET | Freq: Two times a day (BID) | ORAL | Status: DC
  Administered 2014-06-26 – 2014-07-03 (×15): 80 mg via ORAL
  Filled 2014-06-26 (×16): qty 2

## 2014-06-26 MED ORDER — POLYETHYLENE GLYCOL 3350 17 G PO PACK
17.0000 g | PACK | Freq: Every day | ORAL | Status: DC | PRN
  Administered 2014-06-28 – 2014-06-29 (×2): 17 g via ORAL
  Filled 2014-06-26 (×2): qty 1

## 2014-06-26 MED FILL — Sodium Chloride Irrigation Soln 0.9%: Qty: 3000 | Status: AC

## 2014-06-26 MED FILL — Sodium Chloride IV Soln 0.9%: INTRAVENOUS | Qty: 1000 | Status: AC

## 2014-06-26 MED FILL — Heparin Sodium (Porcine) Inj 1000 Unit/ML: INTRAMUSCULAR | Qty: 30 | Status: AC

## 2014-06-26 NOTE — Progress Notes (Signed)
  Echocardiogram 2D Echocardiogram has been performed.  Darlina Sicilian M 06/26/2014, 2:33 PM

## 2014-06-26 NOTE — Progress Notes (Signed)
Has tolerated extubation Except for expected post op pain, no complaints No distress He has had episodes of tachycardia with frequent PACs and PVCs   Filed Vitals:   06/26/14 0742 06/26/14 0800 06/26/14 1127 06/26/14 1144  BP:  98/41 126/57   Pulse:  106    Temp: 98.6 F (37 C)   99.1 F (37.3 C)  TempSrc: Oral   Oral  Resp:  14    Height:      Weight:      SpO2:  94%     NAD No JVD Chest clear Freq extrasystoles, no M Abd soft, NABS Ext warm, no edema  BMET    Component Value Date/Time   NA 132* 06/26/2014 0449   K 4.1 06/26/2014 0449   CL 101 06/26/2014 0449   CO2 26 06/26/2014 0449   GLUCOSE 136* 06/26/2014 0449   BUN 13 06/26/2014 0449   CREATININE 0.82 06/26/2014 0449   CALCIUM 7.4* 06/26/2014 0449   GFRNONAA >60 06/26/2014 0449   GFRAA >60 06/26/2014 0449    CBC    Component Value Date/Time   WBC 10.5 06/26/2014 0449   RBC 3.69* 06/26/2014 0449   HGB 11.2* 06/26/2014 0449   HCT 31.9* 06/26/2014 0449   PLT 115* 06/26/2014 0449   MCV 86.4 06/26/2014 0449   MCH 30.4 06/26/2014 0449   MCHC 35.1 06/26/2014 0449   RDW 12.7 06/26/2014 0449   LYMPHSABS 3.0 08/01/2013 0412   MONOABS 0.9 08/01/2013 0412   EOSABS 0.1 08/01/2013 0412   BASOSABS 0.0 08/01/2013 0412    No new CXR  IMPRESSION: S/P prolonged spine surgery VDRF, resolved Mild steroid induced hyperglycemia H/O CAD Tachyarrhythmias   PLAN: Post op mgmt per NS Cont SSI while on steroids (ordered per NS) Low dose beta blocker Check Echocardiogram Consider Cards consultation   Merton Border, MD ; [facility] service Mobile [phone].  After 5:30 PM or weekends, call [phone]

## 2014-06-26 NOTE — Evaluation (Signed)
Occupational Therapy Evaluation Patient Details Name: Stephen Rowe MRN: [id number] DOB: 1940/10/07 Today's Date: 06/26/2014    History of Present Illness 74 year old male with PMH as below, which includes chronic issues with his back, grade 1 spondylolisthesis at L5-S1, severe spinal stenosis at L3-4 4-5 and L5-S1. 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 Patient's failed all forms of conservative treatment with epidural steroid injections, facet blocks, anti-inflammatories, and physical therapy. He has had multiple hospitalizations and with his progressive clinical syndrome imaging and failure conservative treatment surgical intervention was recommended. He presented 5/20 for stage 1 of a 2 stage procedure. Stage one included the anterolateral interbody fusions at L2-3 and L3-4. This was without complication and he was doing well postoperatively. 5/23 he was taken for stage 2 which included T10 to ilium fusion including decompressive laminectomies and foraminotomies at L4-5 and L5-S1. Post operatively he remained on ventilator and was sent to ICU. PCCM to assist with vent, medical management. PMHx: rotator cuff repair, TKR, chole   Clinical Impression   Pt admitted with above. He demonstrates the below listed deficits and will benefit from continued OT to maximize safety and independence with BADLs.  Pt requires mod A overall for ADLs and min A +2 for functional mobility.  Anticipate good progress and that he will be able discharge home with spouse and [place]       Follow Up Recommendations  Home health OT;Supervision/Assistance - 24 hour    Equipment Recommendations  None recommended by OT    Recommendations for Other Services       Precautions / Restrictions Precautions Precautions: Back;Fall Precaution Comments: Reviewed back precautions with pt and spouse  Required Braces or Orthoses: Spinal Brace Spinal Brace: Lumbar corset;Applied  in sitting position      Mobility Bed Mobility Overal bed mobility: Needs Assistance Bed Mobility: Rolling;Sidelying to Sit Rolling: Min assist Sidelying to sit: Mod assist       General bed mobility comments: Pt required use of bedrails, and instruction on technique  Transfers Overall transfer level: Needs assistance   Transfers: Stand Pivot Transfers;Sit to/from Stand Sit to Stand: Min assist;+2 physical assistance Stand pivot transfers: Min assist;+2 safety/equipment       General transfer comment: Min A +2 to power up into standing.  Min A to steady with transfers and ambulation.  Min verbal cues for walker safety     Balance Overall balance assessment: Needs assistance Sitting-balance support: Feet supported Sitting balance-Leahy Scale: Fair     Standing balance support: During functional activity Standing balance-Leahy Scale: Poor                              ADL Overall ADL's : Needs assistance/impaired Eating/Feeding: Independent   Grooming: Wash/dry hands;Wash/dry face;Oral care;Brushing hair;Set up;Sitting   Upper Body Bathing: Minimal assitance;Sitting   Lower Body Bathing: Moderate assistance;Sit to/from stand   Upper Body Dressing : Minimal assistance;Sitting   Lower Body Dressing: Maximal assistance;Sit to/from stand Lower Body Dressing Details (indicate cue type and reason): Pt is able to cross ankles over knees  Toilet Transfer: Minimal assistance;+2 for safety/equipment;Ambulation;Comfort height toilet;BSC;RW;Grab bars   Toileting- Clothing Manipulation and Hygiene: Maximal assistance;Sit to/from stand       Functional mobility during ADLs: Minimal assistance;+2 for safety/equipment General ADL Comments: Pt moves slowly and guarded.  He is able to cross ankles over knees, but is not yet able to reach feet  Vision     Perception     Praxis      Pertinent Vitals/Pain Pain Assessment: 0-10 Pain Score: 5  Pain Location:  back  Pain Descriptors / Indicators: Aching Pain Intervention(s): Monitored during session;Premedicated before session     Hand Dominance     Extremity/Trunk Assessment Upper Extremity Assessment Upper Extremity Assessment: Overall WFL for tasks assessed   Lower Extremity Assessment Lower Extremity Assessment: Defer to PT evaluation       Communication Communication Communication: No difficulties   Cognition Arousal/Alertness: Awake/alert Behavior During Therapy: WFL for tasks assessed/performed Overall Cognitive Status: Within Functional Limits for tasks assessed                     General Comments       Exercises       Shoulder Instructions      Home Living Family/patient expects to be discharged to:: Private residence Living Arrangements: Spouse/significant other Available Help at Discharge: Family;Available 24 hours/day Type of Home: House Home Access: Stairs to enter [employer] of Steps: 2 Entrance Stairs-Rails: Right;Left Home Layout: One level     Bathroom Shower/Tub: Tub/shower unit;Curtain Shower/tub characteristics: Architectural technologist: Standard     Home Equipment: Environmental consultant - 2 wheels;Bedside commode;Shower seat;Walker - 4 wheels          Prior Functioning/Environment Level of Independence: Independent             OT Diagnosis: Generalized weakness;Acute pain   OT Problem List: Decreased strength;Decreased activity tolerance;Impaired balance (sitting and/or standing);Decreased knowledge of use of DME or AE;Decreased knowledge of precautions;Pain   OT Treatment/Interventions: Self-care/ADL training;DME and/or AE instruction;Therapeutic activities;Patient/family education;Balance training    OT Goals(Current goals can be found in the care plan section) Acute Rehab OT Goals Patient Stated Goal: regain independence OT Goal Formulation: With patient/family Time For Goal Achievement: 07/10/14 Potential to Achieve Goals:  Good ADL Goals Pt Will Perform Grooming: with supervision;standing Pt Will Perform Upper Body Bathing: with supervision;sitting Pt Will Perform Lower Body Bathing: with supervision;with adaptive equipment;sit to/from stand Pt Will Perform Upper Body Dressing: with supervision;sitting Pt Will Perform Lower Body Dressing: with supervision;with adaptive equipment;sit to/from stand Pt Will Transfer to Toilet: with supervision;ambulating;regular height toilet;bedside commode;grab bars Pt Will Perform Toileting - Clothing Manipulation and hygiene: with supervision;sit to/from stand Pt Will Perform Tub/Shower Transfer: Tub transfer;with min guard assist;ambulating;shower seat;rolling walker  OT Frequency: Min 2X/week   Barriers to D/C:            Co-evaluation PT/OT/SLP Co-Evaluation/Treatment: Yes Reason for Co-Treatment: Complexity of the patient's impairments (multi-system involvement)          End of Session Equipment Utilized During Treatment: Back brace;Rolling walker Nurse Communication: Mobility status  Activity Tolerance: Patient tolerated treatment well Patient left: in chair;with call bell/phone within reach;with family/visitor present   Time: 7564-3329 OT Time Calculation (min): 34 min Charges:  OT General Charges $OT Visit: 1 Procedure OT Evaluation $Initial OT Evaluation Tier I: 1 Procedure G-Codes:    Misheel Gowans M 07/09/2014, 4:00 PM

## 2014-06-26 NOTE — Progress Notes (Signed)
Rehab Admissions Coordinator Note:  Patient was screened by Charlyn Vialpando L for appropriateness for an Inpatient Acute Rehab Consult.  At this time, we are recommending [place] or home with home health. Pt has [facility] Medicare and it is not likely that they would give authorization for inpatient rehab based on pt's current diagnosis.  Barbaraann Avans L 06/26/2014, 9:04 AM  I can be reached at [phone].

## 2014-06-26 NOTE — Progress Notes (Signed)
Physical Therapy Treatment Patient Details Name: Stephen Rowe MRN: [id number] DOB: 10/08/1940 Today's Date: 06/26/2014    History of Present Illness 74 year old male with PMH as below, which includes chronic issues with his back, grade 1 spondylolisthesis at L5-S1, severe spinal stenosis at L3-4 4-5 and L5-S1. 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 Patient's failed all forms of conservative treatment with epidural steroid injections, facet blocks, anti-inflammatories, and physical therapy. He has had multiple hospitalizations and with his progressive clinical syndrome imaging and failure conservative treatment surgical intervention was recommended. He presented 5/20 for stage 1 of a 2 stage procedure. Stage one included the anterolateral interbody fusions at L2-3 and L3-4. This was without complication and he was doing well postoperatively. 5/23 he was taken for stage 2 which included T10 to ilium fusion including decompressive laminectomies and foraminotomies at L4-5 and L5-S1. Post operatively he remained on ventilator and was sent to ICU. PCCM to assist with vent, medical management. PMHx: rotator cuff repair, TKR, chole    PT Comments    Better management of pain today.  Improved mobility.  Able to mobilize to bathroom with minimal assist and RW.  Hoping pt will be able to discharge straight to home.    Follow Up Recommendations  Home health PT     Equipment Recommendations  None recommended by PT    Recommendations for Other Services       Precautions / Restrictions Precautions Precautions: Back;Fall Precaution Comments: Reviewed back precautions with pt and spouse  Required Braces or Orthoses: Spinal Brace Spinal Brace: Lumbar corset;Applied in sitting position    Mobility  Bed Mobility Overal bed mobility: Needs Assistance Bed Mobility: Rolling;Sidelying to Sit Rolling: Min assist Sidelying to sit: Mod assist        General bed mobility comments: Pt required use of bedrails, and instruction on technique  Transfers Overall transfer level: Needs assistance Equipment used: Rolling walker (2 wheeled) Transfers: Sit to/from Stand Sit to Stand: Min assist;+2 physical assistance Stand pivot transfers: Min assist;+2 safety/equipment       General transfer comment: Min A +2 to power up into standing.  Min A to steady with transfers and ambulation.  Min verbal cues for walker safety   Ambulation/Gait Ambulation/Gait assistance: Min assist Ambulation Distance (Feet): 10 Feet (x2) Assistive device: Rolling walker (2 wheeled) Gait Pattern/deviations: Step-through pattern Gait velocity: slower   General Gait Details: slow and guarded   Stairs            Wheelchair Mobility    Modified Rankin (Stroke Patients Only)       Balance Overall balance assessment: Needs assistance Sitting-balance support: Feet supported Sitting balance-Leahy Scale: Fair Sitting balance - Comments: due to pain   Standing balance support: During functional activity Standing balance-Leahy Scale: Poor                      Cognition Arousal/Alertness: Awake/alert Behavior During Therapy: WFL for tasks assessed/performed Overall Cognitive Status: Within Functional Limits for tasks assessed                      Exercises      General Comments General comments (skin integrity, edema, etc.): VSS throughout session. HR to 113 with activity       Pertinent Vitals/Pain Pain Assessment: 0-10 Pain Score: 5  Pain Location: back Pain Descriptors / Indicators: Aching Pain Intervention(s): Monitored during session;Repositioned;Premedicated before session    Home  Living Family/patient expects to be discharged to:: Private residence Living Arrangements: Spouse/significant other Available Help at Discharge: Family;Available 24 hours/day Type of Home: House Home Access: Stairs to enter Entrance  Stairs-Rails: Right;Left Home Layout: One level Home Equipment: Environmental consultant - 2 wheels;Bedside commode;Shower seat;Walker - 4 wheels      Prior Function Level of Independence: Independent          PT Goals (current goals can now be found in the care plan section) Acute Rehab PT Goals Patient Stated Goal: regain independence PT Goal Formulation: With patient Time For Goal Achievement: 07/09/14 Potential to Achieve Goals: Good Progress towards PT goals: Progressing toward goals    Frequency  Min 5X/week    PT Plan Discharge plan needs to be updated    Co-evaluation   Reason for Co-Treatment: Complexity of the patient's impairments (multi-system involvement)         End of Session   Activity Tolerance: Patient tolerated treatment well;Patient limited by pain;Other (comment) Patient left: in chair;with call bell/phone within reach;with family/visitor present     Time: 1194-1740 PT Time Calculation (min) (ACUTE ONLY): 34 min  Charges:  $Gait Training: 8-22 mins                    G Codes:      Santo Zahradnik, Tessie Fass 06/26/2014, 4:13 PM 06/26/2014  Donnella Sham, PT [phone]  (pager)

## 2014-06-27 DIAGNOSIS — I491 Atrial premature depolarization: Secondary | ICD-10-CM

## 2014-06-27 LAB — CBC WITH DIFFERENTIAL/PLATELET
Basophils Absolute: 0 10*3/uL (ref 0.0–0.1)
Basophils Relative: 0 % (ref 0–1)
Eosinophils Absolute: 0 10*3/uL (ref 0.0–0.7)
Eosinophils Relative: 0 % (ref 0–5)
HCT: 30.5 % — ABNORMAL LOW (ref 39.0–52.0)
Hemoglobin: 10.5 g/dL — ABNORMAL LOW (ref 13.0–17.0)
LYMPHS ABS: 0.9 10*3/uL (ref 0.7–4.0)
Lymphocytes Relative: 8 % — ABNORMAL LOW (ref 12–46)
MCH: 29.3 pg (ref 26.0–34.0)
MCHC: 34.4 g/dL (ref 30.0–36.0)
MCV: 85.2 fL (ref 78.0–100.0)
MONOS PCT: 6 % (ref 3–12)
Monocytes Absolute: 0.6 10*3/uL (ref 0.1–1.0)
NEUTROS PCT: 86 % — AB (ref 43–77)
Neutro Abs: 10 10*3/uL — ABNORMAL HIGH (ref 1.7–7.7)
Platelets: 124 10*3/uL — ABNORMAL LOW (ref 150–400)
RBC: 3.58 MIL/uL — AB (ref 4.22–5.81)
RDW: 12.5 % (ref 11.5–15.5)
WBC: 11.6 10*3/uL — AB (ref 4.0–10.5)

## 2014-06-27 LAB — GLUCOSE, CAPILLARY
GLUCOSE-CAPILLARY: 209 mg/dL — AB (ref 65–99)
GLUCOSE-CAPILLARY: 177 mg/dL — AB (ref 65–99)
GLUCOSE-CAPILLARY: 164 mg/dL — AB (ref 65–99)
Glucose-Capillary: 182 mg/dL — ABNORMAL HIGH (ref 65–99)
Glucose-Capillary: 201 mg/dL — ABNORMAL HIGH (ref 65–99)
Glucose-Capillary: 245 mg/dL — ABNORMAL HIGH (ref 65–99)

## 2014-06-27 LAB — BASIC METABOLIC PANEL
Anion gap: 5 (ref 5–15)
BUN: 13 mg/dL (ref 6–20)
CALCIUM: 7.9 mg/dL — AB (ref 8.9–10.3)
CO2: 25 mmol/L (ref 22–32)
CREATININE: 0.7 mg/dL (ref 0.61–1.24)
Chloride: 104 mmol/L (ref 101–111)
GLUCOSE: 218 mg/dL — AB (ref 65–99)
POTASSIUM: 4.3 mmol/L (ref 3.5–5.1)
SODIUM: 134 mmol/L — AB (ref 135–145)

## 2014-06-27 MED ORDER — POLYETHYLENE GLYCOL 3350 17 G PO PACK
17.0000 g | PACK | Freq: Every day | ORAL | Status: DC
  Administered 2014-06-27 – 2014-07-03 (×7): 17 g via ORAL
  Filled 2014-06-27 (×9): qty 1

## 2014-06-27 MED ORDER — CEFAZOLIN SODIUM-DEXTROSE 2-3 GM-% IV SOLR
2.0000 g | Freq: Three times a day (TID) | INTRAVENOUS | Status: AC
  Administered 2014-06-27 – 2014-07-01 (×12): 2 g via INTRAVENOUS
  Filled 2014-06-27 (×15): qty 50

## 2014-06-27 MED ORDER — METOPROLOL SUCCINATE ER 25 MG PO TB24
25.0000 mg | ORAL_TABLET | Freq: Every day | ORAL | Status: DC
  Administered 2014-06-28 – 2014-07-01 (×4): 25 mg via ORAL
  Filled 2014-06-27 (×6): qty 1

## 2014-06-27 NOTE — Progress Notes (Signed)
Attempted to call report to [facility]. Thewy stated they will call me back.

## 2014-06-27 NOTE — Progress Notes (Signed)
Physical Therapy Treatment Patient Details Name: SKYLAR PRIEST MRN: [id number] DOB: 21-Jan-1941 Today's Date: 06/27/2014    History of Present Illness 74 year old male with PMH as below, which includes chronic issues with his back, grade 1 spondylolisthesis at L5-S1, severe spinal stenosis at L3-4 4-5 and L5-S1. 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 Patient's failed all forms of conservative treatment with epidural steroid injections, facet blocks, anti-inflammatories, and physical therapy. He has had multiple hospitalizations and with his progressive clinical syndrome imaging and failure conservative treatment surgical intervention was recommended. He presented 5/20 for stage 1 of a 2 stage procedure. Stage one included the anterolateral interbody fusions at L2-3 and L3-4. This was without complication and he was doing well postoperatively. 5/23 he was taken for stage 2 which included T10 to ilium fusion including decompressive laminectomies and foraminotomies at L4-5 and L5-S1. Post operatively he remained on ventilator and was sent to ICU. PCCM to assist with vent, medical management. PMHx: rotator cuff repair, TKR, chole    PT Comments    Progressing steadily.  Improving gait stability, decreased pain, reinforced education  Follow Up Recommendations  Home health PT     Equipment Recommendations  None recommended by PT    Recommendations for Other Services       Precautions / Restrictions Precautions Precautions: Back;Fall Precaution Comments: Reviewed back precautions with pt and spouse  Required Braces or Orthoses: Spinal Brace Spinal Brace: Thoracolumbosacral orthotic    Mobility  Bed Mobility Overal bed mobility: Needs Assistance Bed Mobility: Sit to Sidelying         Sit to sidelying: Min assist General bed mobility comments: Reinforcement of technique and practice  Transfers Overall transfer level: Needs  assistance Equipment used: Rolling walker (2 wheeled) Transfers: Sit to/from Stand Sit to Stand: Min assist         General transfer comment: cues for safe hand placement  Ambulation/Gait Ambulation/Gait assistance: Min guard Ambulation Distance (Feet): 80 Feet Assistive device: Rolling walker (2 wheeled) Gait Pattern/deviations: Step-through pattern Gait velocity: slower   General Gait Details: slow and guarded   Stairs            Wheelchair Mobility    Modified Rankin (Stroke Patients Only)       Balance Overall balance assessment: Needs assistance Sitting-balance support: Single extremity supported Sitting balance-Leahy Scale: Fair     Standing balance support: Bilateral upper extremity supported Standing balance-Leahy Scale: Poor Standing balance comment: pain levels are improving as his need for heavy use of arms                    Cognition Arousal/Alertness: Awake/alert Behavior During Therapy: WFL for tasks assessed/performed Overall Cognitive Status: Within Functional Limits for tasks assessed                      Exercises      General Comments General comments (skin integrity, edema, etc.): Reinforced all education incl. back care/prec, logroll, lifting precations, bracing issues and progression of activity.      Pertinent Vitals/Pain Pain Assessment: Faces Faces Pain Scale: Hurts even more Pain Location: back Pain Descriptors / Indicators: Aching;Constant;Operative site guarding Pain Intervention(s): Monitored during session;Premedicated before session;Repositioned    Home Living                      Prior Function            PT Goals (  current goals can now be found in the care plan section) Acute Rehab PT Goals Patient Stated Goal: regain independence PT Goal Formulation: With patient Time For Goal Achievement: 07/09/14 Potential to Achieve Goals: Good Progress towards PT goals: Progressing toward  goals    Frequency  Min 5X/week    PT Plan Current plan remains appropriate    Co-evaluation             End of Session   Activity Tolerance: Patient tolerated treatment well;Patient limited by pain Patient left: in chair;with call bell/phone within reach;with family/visitor present     Time: 7846-9629 PT Time Calculation (min) (ACUTE ONLY): 25 min  Charges:  $Gait Training: 8-22 mins $Therapeutic Activity: 8-22 mins                    G Codes:      Kynli Chou, Tessie Fass 06/27/2014, 5:12 PM  06/27/2014  Donnella Sham, PT [phone]  (pager)

## 2014-06-27 NOTE — Clinical Social Work Note (Signed)
Clinical Social Worker received referral for possible ST-SNF placement.  Chart reviewed.  PT/OT recommending home with home health.  Spoke with RN Case Manager who will follow up with patient to discuss home health needs.    CSW signing off - please re consult if social work needs arise.  Jesse Astin Rape, LCSW [phone] 

## 2014-06-27 NOTE — Progress Notes (Signed)
No new complaints No distress No further ectopy   Filed Vitals:   06/27/14 0833 06/27/14 0900 06/27/14 1000 06/27/14 1102  BP:  127/78 120/60 122/62  Pulse:  85 81 82  Temp: 97.8 F (36.6 C)   98.2 F (36.8 C)  TempSrc: Oral   Oral  Resp:  22 23 22   Height:      Weight:      SpO2:  92% 94% 93%   NAD No JVD Chest clear RRR, no M Abd soft, NABS Ext warm, no edema  BMET    Component Value Date/Time   NA 134* 06/27/2014 0754   K 4.3 06/27/2014 0754   CL 104 06/27/2014 0754   CO2 25 06/27/2014 0754   GLUCOSE 218* 06/27/2014 0754   BUN 13 06/27/2014 0754   CREATININE 0.70 06/27/2014 0754   CALCIUM 7.9* 06/27/2014 0754   GFRNONAA >60 06/27/2014 0754   GFRAA >60 06/27/2014 0754    CBC    Component Value Date/Time   WBC 11.6* 06/27/2014 0754   RBC 3.58* 06/27/2014 0754   HGB 10.5* 06/27/2014 0754   HCT 30.5* 06/27/2014 0754   PLT 124* 06/27/2014 0754   MCV 85.2 06/27/2014 0754   MCH 29.3 06/27/2014 0754   MCHC 34.4 06/27/2014 0754   RDW 12.5 06/27/2014 0754   LYMPHSABS 0.9 06/27/2014 0754   MONOABS 0.6 06/27/2014 0754   EOSABS 0.0 06/27/2014 0754   BASOSABS 0.0 06/27/2014 0754   Echocardiogram is essentially normal - mild LVH, mild dilation of LA, mild elevation of PA pressure estimate  IMPRESSION: S/P prolonged spine surgery VDRF, resolved Mild steroid induced hyperglycemia  Now off steroids. Should resolved H/O CAD Tachyarrhythmias - improved on low dose metoprolol   PLAN: Post op mgmt per NS Cont SSI until CBGs normalize Cont low dose beta blocker  Echocardiogram is essentially normal - mild LVH, mild dilation of LA, mild elevation of PA pressure estimate   None of this warrants eval presently  DC home on Toprol XL 25 mg po daily  He should f/u with his cardiologist as outpt   I explained this to him  If possible, send DC summary to Dr Elonda Husky - Cardiologist in [facility]   PCCM will sign off. Please call if we can be of further assistance   Merton Border, MD ; [facility] [phone].  After 5:30 PM or weekends, call [phone]

## 2014-06-27 NOTE — Progress Notes (Signed)
Occupational Therapy Treatment Patient Details Name: Stephen Rowe MRN: [id number] DOB: 10-Sep-1940 Today's Date: 06/27/2014    History of present illness 74 year old male with PMH as below, which includes chronic issues with his back, grade 1 spondylolisthesis at L5-S1, severe spinal stenosis at L3-4 4-5 and L5-S1. 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 Patient's failed all forms of conservative treatment with epidural steroid injections, facet blocks, anti-inflammatories, and physical therapy. He has had multiple hospitalizations and with his progressive clinical syndrome imaging and failure conservative treatment surgical intervention was recommended. He presented 5/20 for stage 1 of a 2 stage procedure. Stage one included the anterolateral interbody fusions at L2-3 and L3-4. This was without complication and he was doing well postoperatively. 5/23 he was taken for stage 2 which included T10 to ilium fusion including decompressive laminectomies and foraminotomies at L4-5 and L5-S1. Post operatively he remained on ventilator and was sent to ICU. PCCM to assist with vent, medical management. PMHx: rotator cuff repair, TKR, chole   OT comments  Pt is progressing towards his goals and reports that movement seems easier today. Able to ambulate in room with min guard assist and reinforced education on back precautions, as pt only able to recall "no bending." Acute OT will continue with POC to promote independence.    Follow Up Recommendations  Home health OT;Supervision/Assistance - 24 hour    Equipment Recommendations  None recommended by OT    Recommendations for Other Services      Precautions / Restrictions Precautions Precautions: Back;Fall Precaution Comments: Reviewed back precautions with pt and spouse. Pt only able to recall "no bending." Required Braces or Orthoses: Spinal Brace Spinal Brace: Thoracolumbosacral orthotic        Mobility Bed Mobility Overal bed mobility: Needs Assistance Bed Mobility: Sit to Sidelying;Rolling Rolling: Supervision Sidelying to sit: Min guard       General bed mobility comments: Reinforcement of technique and practice  Transfers Overall transfer level: Needs assistance Equipment used: Rolling walker (2 wheeled) Transfers: Sit to/from Stand Sit to Stand: Min guard         General transfer comment: cues for safe hand placement        ADL Overall ADL's : Needs assistance/impaired     Grooming: Min guard;Standing           Upper Body Dressing : Minimal assistance;Sitting Upper Body Dressing Details (indicate cue type and reason): including brace, which wife assisted to donned.  Lower Body Dressing: Sit to/from stand;Moderate assistance   Toilet Transfer: Min guard;Ambulation;RW           Functional mobility during ADLs: Min guard;Rolling walker General ADL Comments: Pt able to ambulate to doorway of room and back to recliner x2 when his dinner tray arrived. Pt reports ambulation is getting easier. Pt wife educated on assisting pt to don back brace and practiced in sitting. Would benefit from further practice.                 Cognition   Behavior During Therapy: WFL for tasks assessed/performed Overall Cognitive Status: Within Functional Limits for tasks assessed                                    Pertinent Vitals/ Pain       Pain Assessment: Faces Faces Pain Scale: Hurts even more Pain Location: back Pain Descriptors / Indicators: Aching;Constant;Operative site  guarding Pain Intervention(s): Monitored during session;Premedicated before session;Repositioned         Frequency Min 2X/week     Progress Toward Goals  OT Goals(current goals can now be found in the care plan section)  Progress towards OT goals: Progressing toward goals  Acute Rehab OT Goals Patient Stated Goal: regain independence  Plan Discharge plan remains  appropriate       End of Session Equipment Utilized During Treatment: Gait belt;Rolling walker   Activity Tolerance Patient tolerated treatment well   Patient Left in chair;with call bell/phone within reach;with family/visitor present   Nurse Communication Other (comment) (pt in chair)        Time: 8185-9093 OT Time Calculation (min): 39 min  Charges: OT General Charges $OT Visit: 1 Procedure OT Treatments $Self Care/Home Management : 8-22 mins $Therapeutic Activity: 23-37 mins  Hester, Forget 06/27/2014, 5:41 PM  Secundino Ginger Lynetta Mare, OTR/L Occupational Therapist [phone] (pager)

## 2014-06-27 NOTE — Progress Notes (Signed)
Subjective: Patient reports Doing much better this morning significant improved pain  Objective: Vital signs in last 24 hours: Temp:  [97.8 F (36.6 C)-99.1 F (37.3 C)] 97.8 F (36.6 C) (05/26 0400) Pulse Rate:  [77-129] 80 (05/26 0700) Resp:  [14-30] 17 (05/26 0700) BP: (76-144)/(40-91) 114/48 mmHg (05/26 0700) SpO2:  [91 %-96 %] 91 % (05/26 0700)  Intake/Output from previous day: 05/25 0701 - 05/26 0700 In: 938.5 [P.O.:300; I.V.:538.5; IV Piggyback:100] Out: 9811 [Urine:3325; Drains:250] Intake/Output this shift:    Strength out of 5 wound clean dry and intact  Lab Results:  Recent Labs  06/25/14 0512 06/26/14 0449  WBC 12.6* 10.5  HGB 13.2 11.2*  HCT 38.5* 31.9*  PLT 131* 115*   BMET  Recent Labs  06/25/14 1933 06/26/14 0449  NA 133* 132*  K 3.9 4.1  CL 102 101  CO2 26 26  GLUCOSE 182* 136*  BUN 15 13  CREATININE 0.84 0.82  CALCIUM 7.5* 7.4*    Studies/Results: No results found.  Assessment/Plan: Doing much better will transfer to the floor continue physical occupational rehabilitation well fitted him for an [place] TLSO.  LOS: 6 days     Stephen Rowe P 06/27/2014, 7:30 AM

## 2014-06-27 NOTE — Progress Notes (Signed)
Pt is transferred from 32m to 4N10. Admission vital sign is stable

## 2014-06-28 ENCOUNTER — Encounter (HOSPITAL_COMMUNITY): Payer: Self-pay | Admitting: Neurosurgery

## 2014-06-28 LAB — GLUCOSE, CAPILLARY
GLUCOSE-CAPILLARY: 184 mg/dL — AB (ref 65–99)
Glucose-Capillary: 103 mg/dL — ABNORMAL HIGH (ref 65–99)
Glucose-Capillary: 135 mg/dL — ABNORMAL HIGH (ref 65–99)
Glucose-Capillary: 125 mg/dL — ABNORMAL HIGH (ref 65–99)

## 2014-06-28 MED ORDER — METHYLPREDNISOLONE 4 MG PO TBPK
4.0000 mg | ORAL_TABLET | Freq: Three times a day (TID) | ORAL | Status: AC
  Administered 2014-06-29 (×3): 4 mg via ORAL

## 2014-06-28 MED ORDER — METHYLPREDNISOLONE 4 MG PO TBPK
4.0000 mg | ORAL_TABLET | Freq: Four times a day (QID) | ORAL | Status: AC
  Administered 2014-06-30 – 2014-07-03 (×10): 4 mg via ORAL
  Filled 2014-06-28: qty 21

## 2014-06-28 MED ORDER — METHYLPREDNISOLONE 4 MG PO TBPK
8.0000 mg | ORAL_TABLET | Freq: Every morning | ORAL | Status: AC
  Administered 2014-06-28: 8 mg via ORAL
  Filled 2014-06-28: qty 21

## 2014-06-28 MED ORDER — METHYLPREDNISOLONE 4 MG PO TBPK
4.0000 mg | ORAL_TABLET | ORAL | Status: AC
  Administered 2014-06-28: 4 mg via ORAL

## 2014-06-28 MED ORDER — BISACODYL 10 MG RE SUPP
10.0000 mg | Freq: Every day | RECTAL | Status: DC | PRN
  Administered 2014-06-28: 10 mg via RECTAL
  Filled 2014-06-28: qty 1

## 2014-06-28 MED ORDER — HYDROMORPHONE HCL 2 MG PO TABS
4.0000 mg | ORAL_TABLET | ORAL | Status: DC | PRN
  Administered 2014-06-28 – 2014-07-01 (×12): 4 mg via ORAL
  Filled 2014-06-28 (×14): qty 2

## 2014-06-28 MED ORDER — METHYLPREDNISOLONE 4 MG PO TBPK
8.0000 mg | ORAL_TABLET | Freq: Every evening | ORAL | Status: AC
  Administered 2014-06-28: 8 mg via ORAL

## 2014-06-28 MED ORDER — METHYLPREDNISOLONE 4 MG PO TBPK
8.0000 mg | ORAL_TABLET | Freq: Every evening | ORAL | Status: AC
  Administered 2014-06-29: 8 mg via ORAL

## 2014-06-28 NOTE — Progress Notes (Signed)
Subjective: Patient reports Condition of slowly worsening pain today all localized around his incision no leg pain no new numbness or tingling. He did ambulate yesterday he is voiding well still no bowel movements. But positive flatus  Objective: Vital signs in last 24 hours: Temp:  [97.8 F (36.6 C)-98.4 F (36.9 C)] 97.8 F (36.6 C) (05/27 0929) Pulse Rate:  [63-90] 83 (05/27 0929) Resp:  [18-22] 18 (05/27 0929) BP: (100-122)/(50-62) 109/50 mmHg (05/27 0929) SpO2:  [92 %-97 %] 96 % (05/27 0929)  Intake/Output from previous day: 05/26 0701 - 05/27 0700 In: 270 [P.O.:240; I.V.:30] Out: 435 [Urine:300; Drains:135] Intake/Output this shift: Total I/O In: -  Out: 300 [Urine:300]  Awake alert strength out of 5 wound is clean dry and intact drain 135 mL  Lab Results:  Recent Labs  06/26/14 0449 06/27/14 0754  WBC 10.5 11.6*  HGB 11.2* 10.5*  HCT 31.9* 30.5*  PLT 115* 124*   BMET  Recent Labs  06/26/14 0449 06/27/14 0754  NA 132* 134*  K 4.1 4.3  CL 101 104  CO2 26 25  GLUCOSE 136* 218*  BUN 13 13  CREATININE 0.82 0.70  CALCIUM 7.4* 7.9*    Studies/Results: No results found.  Assessment/Plan: Continue to mobilize with physical occupational therapy in his brace we'll add stairways back into his regimen we'll change his pain medication from oxycodone to oral Dilaudid. Try suppositories to stimulate his bowels.  LOS: 7 days     Marvella Jenning P 06/28/2014, 10:05 AM

## 2014-06-28 NOTE — Progress Notes (Addendum)
Physical Therapy Treatment Patient Details Name: Stephen Rowe MRN: [id number] DOB: [birth year] Today's Date: 06/28/2014    History of Present Illness 74 year old male who has presented 5/20 for stage 1 of a 2 stage procedure. Stage one included the anterolateral interbody fusions at L2-3 and L3-4.  5/23 he was taken for stage 2 which included T10 to ilium fusion including decompressive laminectomies and foraminotomies at L4-5 and L5-S1. Post operatively he remained on ventilator and was sent to ICU, extubated 5/24.     PT Comments    Patient seen for mobility progression and OOB. Patient initially mobilizing well assisted with brace, then patient OOB and ambulated in room, patient requesting to attempt use of bathroom. During attempt, patient became very diaphoretic and dizzy. Assisted patient from toilet to chair, called for assist, BP assessed 90s/60s with decreased responsiveness. Nsg to room, assisted with transfer of patient back to bed, brace removed, patient placed in revers trendelenburg in bed for a few minutes and SCDs reapplied. Improvements in arousal and symptoms after a few moments. Patient left in bed in room with nsg present. Will continue to see and progress as tolerated.   Follow Up Recommendations  Home health PT     Equipment Recommendations  None recommended by PT    Recommendations for Other Services       Precautions / Restrictions Precautions Precautions: Back;Fall Precaution Comments: Reviewed back precautions with pt and spouse. Pt only able to recall "no bending." Required Braces or Orthoses: Spinal Brace Spinal Brace: Thoracolumbosacral orthotic Restrictions Weight Bearing Restrictions: No    Mobility  Bed Mobility Overal bed mobility: Needs Assistance Bed Mobility: Sit to Sidelying;Rolling Rolling: Supervision Sidelying to sit: Min guard     Sit to sidelying: Mod assist;+2 for physical assistance General bed mobility comments: assist to elevate  LEs back to bed to reposition secondary to patient with extreme dizziness and unwell feeling  Transfers Overall transfer level: Needs assistance Equipment used: Rolling walker (2 wheeled) Transfers: Sit to/from [employer] Sit to Stand: Min guard Stand pivot transfers: Min assist;+2 safety/equipment       General transfer comment: +2 min assist from chair back to bed after patient became very diaphoretic and dizzy with poor responsiveness  Ambulation/Gait Ambulation/Gait assistance: Supervision Ambulation Distance (Feet): 30 Feet Assistive device: Rolling walker (2 wheeled) Gait Pattern/deviations: Step-through pattern Gait velocity: decreased Gait velocity interpretation: Below normal speed for age/gender General Gait Details: cautious gait patten, close supervision provided   Stairs            Wheelchair Mobility    Modified Rankin (Stroke Patients Only)       Balance     Sitting balance-Leahy Scale: Fair       Standing balance-Leahy Scale: Poor                      Cognition Arousal/Alertness: Awake/alert Behavior During Therapy: WFL for tasks assessed/performed Overall Cognitive Status: Within Functional Limits for tasks assessed                      Exercises      General Comments        Pertinent Vitals/Pain Faces Pain Scale: Hurts even more    Home Living Family/patient expects to be discharged to:: Private residence Living Arrangements: Spouse/significant other Available Help at Discharge: Family;Available 24 hours/day Type of Home: House Home Access: Stairs to enter Entrance Stairs-Rails: Right;Left Home Layout: One level Home Equipment: Environmental consultant -  2 wheels;Bedside commode;Shower seat;Walker - 4 wheels      Prior Function Level of Independence: Independent          PT Goals (current goals can now be found in the care plan section) Acute Rehab PT Goals Patient Stated Goal: regain independence PT  Goal Formulation: With patient Time For Goal Achievement: 07/09/14 Potential to Achieve Goals: Good    Frequency  Min 5X/week    PT Plan      Co-evaluation             End of Session   Activity Tolerance: Patient tolerated treatment well;Patient limited by pain Patient left: in bed;with call bell/phone within reach;with bed alarm set;with nursing/sitter in room;with family/visitor present;with SCD's reapplied     Time: 3754-3606 PT Time Calculation (min) (ACUTE ONLY): 28 min  Charges:  $Therapeutic Activity: 23-37 mins                    G CodesDuncan Dull 2014-07-26, 4:43 PM  Alben Deeds, [place] DPT  [phone]

## 2014-06-28 NOTE — Progress Notes (Signed)
Patient in a great deal of pain was not able to sit in chair last night due to his pain; additionally BP is getting a little soft will continue to monitor.

## 2014-06-28 NOTE — Progress Notes (Signed)
OT Cancellation Note  Patient Details Name: CLAUS SILVESTRO MRN: [id number] DOB: 11/19/1940   Cancelled Treatment:    Reason Eval/Treat Not Completed: Fatigue/lethargy limiting ability to participate;Medical issues which prohibited therapy.  Pt back in bed post PT.  Pt orthostatic with PT. See PT note for details  [place], Mickel Baas, [place] [phone] 06/28/2014, 12:05 PM

## 2014-06-29 LAB — GLUCOSE, CAPILLARY
GLUCOSE-CAPILLARY: 129 mg/dL — AB (ref 65–99)
GLUCOSE-CAPILLARY: 162 mg/dL — AB (ref 65–99)
Glucose-Capillary: 126 mg/dL — ABNORMAL HIGH (ref 65–99)
Glucose-Capillary: 207 mg/dL — ABNORMAL HIGH (ref 65–99)

## 2014-06-29 MED ORDER — SENNA 8.6 MG PO TABS
1.0000 | ORAL_TABLET | Freq: Two times a day (BID) | ORAL | Status: DC
  Administered 2014-06-29 – 2014-07-03 (×8): 8.6 mg via ORAL
  Filled 2014-06-29 (×8): qty 1

## 2014-06-29 MED ORDER — MAGNESIUM CITRATE PO SOLN
1.0000 | Freq: Once | ORAL | Status: AC
  Administered 2014-06-29: 1 via ORAL
  Filled 2014-06-29: qty 296

## 2014-06-29 NOTE — Progress Notes (Signed)
Patient ID: Stephen Rowe, male   DOB: 1940/04/08, 74 y.o.   MRN: [id number] BP 115/73 mmHg  Pulse 79  Temp(Src) 97.9 F (36.6 C) (Oral)  Resp 20  Ht 5\' 10"  (1.778 m)  Wt 94.348 kg (208 lb)  BMI 29.84 kg/m2  SpO2 95% Alert and oriented x 4, speech is clear, and fluent Moving all extremities well Wound dressing is dry, no signs of infection

## 2014-06-29 NOTE — Progress Notes (Signed)
Occupational Therapy Treatment Patient Details Name: Stephen Rowe MRN: [id number] DOB: 10/23/1940 Today's Date: 06/29/2014    History of present illness 74 year old male who has presented 5/20 for stage 1 of a 2 stage procedure. Stage one included the anterolateral interbody fusions at L2-3 and L3-4.  5/23 he was taken for stage 2 which included T10 to ilium fusion including decompressive laminectomies and foraminotomies at L4-5 and L5-S1. Post operatively he remained on ventilator and was sent to ICU, extubated 5/24.    OT comments  Pt able to perform LB ADLs with min A.  He fatigued quickly today (had just finished with PT).  Will continue to follow.   Follow Up Recommendations  Home health OT;Supervision/Assistance - 24 hour    Equipment Recommendations  None recommended by OT    Recommendations for Other Services      Precautions / Restrictions Precautions Precautions: Back;Fall Precaution Comments: Reviewed back precautions with pt and spouse. Pt only able to recall "no bending." Required Braces or Orthoses: Spinal Brace Spinal Brace: Thoracolumbosacral orthotic Restrictions Weight Bearing Restrictions: No       Mobility Bed Mobility                  Transfers Overall transfer level: Needs assistance Equipment used: Rolling walker (2 wheeled) Transfers: Sit to/from [employer] Transfers Sit to Stand: Min guard Stand pivot transfers: Min guard            Balance                                   ADL         Grooming Details (indicate cue type and reason): Instructed pt to avoid bending with shaving and brushing teeth.  Reinforced need to bring cup to mouth during oral care      Lower Body Bathing: Minimal assistance;Sit to/from stand       Lower Body Dressing: Minimal assistance;Sit to/from stand Lower Body Dressing Details (indicate cue type and reason): Pt requires assist to don socks over toes only  Toilet Transfer:  Min guard;Ambulation;RW           Functional mobility during ADLs: Min guard;Rolling walker General ADL Comments: Pt fatigued after PT       Vision                     Perception     Praxis      Cognition   Behavior During Therapy: WFL for tasks assessed/performed Overall Cognitive Status: Within Functional Limits for tasks assessed                       Extremity/Trunk Assessment               Exercises     Shoulder Instructions       General Comments      Pertinent Vitals/ Pain       Pain Assessment: Faces Faces Pain Scale: Hurts little more Pain Location: back  Pain Descriptors / Indicators: Aching Pain Intervention(s): Monitored during session  Home Living                                          Prior Functioning/Environment  Frequency Min 2X/week     Progress Toward Goals  OT Goals(current goals can now be found in the care plan section)  Progress towards OT goals: Progressing toward goals  ADL Goals Pt Will Perform Grooming: with supervision;standing Pt Will Perform Upper Body Bathing: with supervision;sitting Pt Will Perform Lower Body Bathing: with supervision;with adaptive equipment;sit to/from stand Pt Will Perform Upper Body Dressing: with supervision;sitting Pt Will Perform Lower Body Dressing: with supervision;with adaptive equipment;sit to/from stand Pt Will Transfer to Toilet: with supervision;ambulating;regular height toilet;bedside commode;grab bars Pt Will Perform Toileting - Clothing Manipulation and hygiene: with supervision;sit to/from stand Pt Will Perform Tub/Shower Transfer: Tub transfer;with min guard assist;ambulating;shower seat;rolling walker  Plan Discharge plan remains appropriate    Co-evaluation                 End of Session Equipment Utilized During Treatment: Back brace;Rolling walker   Activity Tolerance Patient limited by fatigue   Patient Left  in chair;with call bell/phone within reach;with family/visitor present   Nurse Communication Mobility status        Time: 1694-5038 OT Time Calculation (min): 15 min  Charges: OT General Charges $OT Visit: 1 Procedure OT Treatments $Self Care/Home Management : 8-22 mins  Simon Aaberg M 06/29/2014, 2:31 PM

## 2014-06-29 NOTE — Progress Notes (Signed)
Physical Therapy Treatment Patient Details Name: Stephen Rowe MRN: [id number] DOB: 1940/08/18 Today's Date: 06/29/2014    History of Present Illness 74 year old male who has presented 5/20 for stage 1 of a 2 stage procedure. Stage one included the anterolateral interbody fusions at L2-3 and L3-4.  5/23 he was taken for stage 2 which included T10 to ilium fusion including decompressive laminectomies and foraminotomies at L4-5 and L5-S1. Post operatively he remained on ventilator and was sent to ICU, extubated 5/24.     PT Comments    Pt with improved mobility today and no c/o dizziness throughout session.  Pt able to increase ambulation, but needs continued education on back precautions and provided handout today.  Will continue to follow to ensure safe mobility for return to home.    Follow Up Recommendations  Home health PT;Supervision/Assistance - 24 hour     Equipment Recommendations  Rolling walker with 5" wheels    Recommendations for Other Services       Precautions / Restrictions Precautions Precautions: Back;Fall Precaution Booklet Issued: Yes (comment) Precaution Comments: Reviewed back precautions with pt and spouse. Pt only able to recall "no bending." Required Braces or Orthoses: Spinal Brace Spinal Brace: Thoracolumbosacral orthotic;Applied in sitting position Restrictions Weight Bearing Restrictions: No    Mobility  Bed Mobility Overal bed mobility: Needs Assistance Bed Mobility: Rolling;Sidelying to Sit Rolling: Supervision Sidelying to sit: Min assist       General bed mobility comments: cues for log roll technique and back precautions.  pt needed A with bringing trunk up to sitting.    Transfers Overall transfer level: Needs assistance Equipment used: Rolling walker (2 wheeled) Transfers: Sit to/from Stand Sit to Stand: Min assist Stand pivot transfers: Min guard       General transfer comment: MinA to stand from bed, but demonstrates good UE  use.    Ambulation/Gait Ambulation/Gait assistance: Min guard Ambulation Distance (Feet): 140 Feet Assistive device: Rolling walker (2 wheeled) Gait Pattern/deviations: Step-through pattern;Decreased stride length;Trunk flexed     General Gait Details: pt much improved with ambulation today and no c/o dizziness.  cues for more relaxed and upright posture.     Stairs            Wheelchair Mobility    Modified Rankin (Stroke Patients Only)       Balance Overall balance assessment: Needs assistance Sitting-balance support: No upper extremity supported;Feet supported Sitting balance-Leahy Scale: Fair     Standing balance support: Bilateral upper extremity supported;During functional activity Standing balance-Leahy Scale: Poor                      Cognition Arousal/Alertness: Awake/alert Behavior During Therapy: WFL for tasks assessed/performed Overall Cognitive Status: Within Functional Limits for tasks assessed                      Exercises      General Comments        Pertinent Vitals/Pain Pain Assessment: 0-10 Pain Score: 5  Faces Pain Scale: Hurts little more Pain Location: Back Pain Descriptors / Indicators: Aching Pain Intervention(s): Monitored during session;Premedicated before session;Repositioned    Home Living                      Prior Function            PT Goals (current goals can now be found in the care plan section) Acute Rehab PT Goals Patient Stated Goal:  regain independence PT Goal Formulation: With patient Time For Goal Achievement: 07/09/14 Potential to Achieve Goals: Good Progress towards PT goals: Progressing toward goals    Frequency  Min 5X/week    PT Plan Equipment recommendations need to be updated    Co-evaluation             End of Session Equipment Utilized During Treatment: Gait belt;Back brace Activity Tolerance: Patient tolerated treatment well Patient left: in chair;with  call bell/phone within reach;with family/visitor present     Time: 1340-1405 PT Time Calculation (min) (ACUTE ONLY): 25 min  Charges:  $Gait Training: 8-22 mins $Therapeutic Activity: 8-22 mins                    G CodesCatarina Hartshorn, [place] 06/29/2014, 3:06 PM

## 2014-06-30 LAB — GLUCOSE, CAPILLARY
GLUCOSE-CAPILLARY: 133 mg/dL — AB (ref 65–99)
Glucose-Capillary: 120 mg/dL — ABNORMAL HIGH (ref 65–99)
Glucose-Capillary: 115 mg/dL — ABNORMAL HIGH (ref 65–99)
Glucose-Capillary: 132 mg/dL — ABNORMAL HIGH (ref 65–99)

## 2014-06-30 NOTE — Progress Notes (Signed)
Occupational Therapy treatment Note   Assisted pt to BR.  Discussed placement of grab bars as they plan to install grab bars at home.  He requires min guard assist for functional mobility     06/29/14 1500  OT Visit Information  Last OT Received On 06/30/14  Assistance Needed +1  History of Present Illness 74 year old male who has presented 5/20 for stage 1 of a 2 stage procedure. Stage one included the anterolateral interbody fusions at L2-3 and L3-4.  5/23 he was taken for stage 2 which included T10 to ilium fusion including decompressive laminectomies and foraminotomies at L4-5 and L5-S1. Post operatively he remained on ventilator and was sent to ICU, extubated 5/24.   OT Time Calculation  OT Start Time (ACUTE ONLY) 1440  OT Stop Time (ACUTE ONLY) 1458  OT Time Calculation (min) 18 min  Precautions  Precautions Back;Fall  Precaution Booklet Issued Yes (comment)  Precaution Comments Reviewed back precautions with pt and spouse. Pt only able to recall "no bending."  Required Braces or Orthoses Spinal Brace  Spinal Brace TLSO;Applied in sitting position  Pain Assessment  Pain Assessment 0-10  Pain Score 3  Pain Location back   Pain Descriptors / Indicators Aching  Pain Intervention(s) Monitored during session  Cognition  Arousal/Alertness Awake/alert  Behavior During Therapy WFL for tasks assessed/performed  Overall Cognitive Status Within Functional Limits for tasks assessed  ADL  Toilet Transfer Min guard;Ambulation;Comfort height toilet;Grab bars;RW  [employer]ed forces technical officer Details (indicate cue type and reason) requried cues for technique   Toileting- Clothing Manipulation and Hygiene Minimal assistance;Sit to/from stand  Toileting - Clothing Manipulation Details (indicate cue type and reason) assist to pull pants up and down   General ADL Comments Discussed options for grab bar placement with wife as they plan to install grab bars in bathroom   Balance  Overall balance assessment  Needs assistance  Sitting-balance support No upper extremity supported  Sitting balance-Leahy Scale Fair  Standing balance support Bilateral upper extremity supported  Standing balance-Leahy Scale Poor  Transfers  Overall transfer level Needs assistance  Equipment used Rolling walker (2 wheeled)  Transfers Sit to/from [employer] Transfers  Sit to Stand Min guard  Stand pivot transfers Min guard  General transfer comment verbal cues for hand placement   OT - End of Session  Equipment Utilized During Treatment Back brace;Rolling walker  Activity Tolerance Patient limited by fatigue  Patient left in chair;with call bell/phone within reach;with family/visitor present  Nurse Communication Mobility status  OT Assessment/Plan  OT Plan Discharge plan remains appropriate  OT Frequency (ACUTE ONLY) Min 2X/week  Follow Up Recommendations Home health OT;Supervision/Assistance - 24 hour  OT Equipment None recommended by OT  OT Goal Progression  Progress towards OT goals Progressing toward goals  ADL Goals  Pt Will Perform Grooming with supervision;standing  Pt Will Perform Upper Body Bathing with supervision;sitting  Pt Will Perform Lower Body Bathing with supervision;with adaptive equipment;sit to/from stand  Pt Will Perform Upper Body Dressing with supervision;sitting  Pt Will Perform Lower Body Dressing with supervision;with adaptive equipment;sit to/from stand  Pt Will Transfer to Toilet with supervision;ambulating;regular height toilet;bedside commode;grab bars  Pt Will Perform Toileting - Clothing Manipulation and hygiene with supervision;sit to/from stand  Pt Will Perform Tub/Shower Transfer Tub transfer;with min guard assist;ambulating;shower seat;rolling walker  OT General Charges  $OT Visit 1 Procedure  OT Treatments  $Self Care/Home Management  8-22 mins   [employer], OTR/L [phone]

## 2014-06-30 NOTE — Progress Notes (Signed)
Patient ID: Stephen Rowe, male   DOB: [birth year], 74 y.o.   MRN: [id number] Doing well. Ambulating. Minimal drainage in hemovac. See orders

## 2014-06-30 NOTE — Progress Notes (Signed)
Occupational Therapy Treatment Patient Details Name: Stephen Rowe MRN: [id number] DOB: [birth year] Today's Date: 06/30/2014    History of present illness 74 year old male who has presented 5/20 for stage 1 of a 2 stage procedure. Stage one included the anterolateral interbody fusions at L2-3 and L3-4.  5/23 he was taken for stage 2 which included T10 to ilium fusion including decompressive laminectomies and foraminotomies at L4-5 and L5-S1. Post operatively he remained on ventilator and was sent to ICU, extubated 5/24.    OT comments  Pt progressing with ADLs (requires light min A).  He demonstrates good understanding of back precautions.  Wife very supportive.   Follow Up Recommendations  Home health OT;Supervision/Assistance - 24 hour    Equipment Recommendations  None recommended by OT    Recommendations for Other Services      Precautions / Restrictions Precautions Precautions: Back;Fall Precaution Booklet Issued: Yes (comment) Precaution Comments: Pt demonstrates good understanding of back precautions  Required Braces or Orthoses: Spinal Brace Spinal Brace: Thoracolumbosacral orthotic;Applied in sitting position       Mobility Bed Mobility Overal bed mobility: Needs Assistance Bed Mobility: Sit to Sidelying         Sit to sidelying: Min assist General bed mobility comments: requries assist to lift LEs into bed   Transfers Overall transfer level: Needs assistance   Transfers: Sit to/from Stand;Stand Pivot Transfers Sit to Stand: Min guard Stand pivot transfers: Min guard       General transfer comment: Pt moves in guarded fashion     Balance Overall balance assessment: Needs assistance Sitting-balance support: Feet supported Sitting balance-Leahy Scale: Good     Standing balance support: During functional activity Standing balance-Leahy Scale: Fair                     ADL Overall ADL's : Needs assistance/impaired     Grooming: Oral  care;Wash/dry face;Wash/dry hands;Min guard;Standing Grooming Details (indicate cue type and reason): Pt demonstrates good and safe technique      Lower Body Bathing: Minimal assistance;Sit to/from stand Lower Body Bathing Details (indicate cue type and reason): for distal portion of feet      Lower Body Dressing: Minimal assistance;Sit to/from stand Lower Body Dressing Details (indicate cue type and reason): Requires assist to pull sock over toes on Rt foot only              Functional mobility during ADLs: Min guard;Rolling walker General ADL Comments: Pt feeling "woozy" at end of session, and assisted to bed       Vision                     Perception     Praxis      Cognition   Behavior During Therapy: [facility] for tasks assessed/performed Overall Cognitive Status: Within Functional Limits for tasks assessed                       Extremity/Trunk Assessment               Exercises     Shoulder Instructions       General Comments      Pertinent Vitals/ Pain       Pain Assessment: 0-10 Pain Score: 5  Pain Location: back  Pain Descriptors / Indicators: Aching;Constant Pain Intervention(s): Monitored during session;Premedicated before session  Home Living  Prior Functioning/Environment              Frequency Min 2X/week     Progress Toward Goals  OT Goals(current goals can now be found in the care plan section)  Progress towards OT goals: Progressing toward goals  ADL Goals Pt Will Perform Grooming: with supervision;standing Pt Will Perform Upper Body Bathing: with supervision;sitting Pt Will Perform Lower Body Bathing: with supervision;with adaptive equipment;sit to/from stand Pt Will Perform Upper Body Dressing: with supervision;sitting Pt Will Perform Lower Body Dressing: with supervision;with adaptive equipment;sit to/from stand Pt Will Transfer to Toilet: with  supervision;ambulating;regular height toilet;bedside commode;grab bars Pt Will Perform Toileting - Clothing Manipulation and hygiene: with supervision;sit to/from stand Pt Will Perform Tub/Shower Transfer: Tub transfer;with min guard assist;ambulating;shower seat;rolling walker  Plan Discharge plan remains appropriate    Co-evaluation                 End of Session Equipment Utilized During Treatment: Back brace;Rolling walker   Activity Tolerance Patient limited by fatigue;Other (comment) (feeling "woozy")   Patient Left in bed;with call bell/phone within reach;with family/visitor present   Nurse Communication Other (comment) (Pt feeling "woozy")        Time: 7824-2353 OT Time Calculation (min): 23 min  Charges: OT General Charges $OT Visit: 1 Procedure OT Treatments $Self Care/Home Management : 23-37 mins  Stephen Rowe M 06/30/2014, 10:01 AM

## 2014-06-30 NOTE — Progress Notes (Signed)
Physical Therapy Treatment Patient Details Name: Stephen Rowe MRN: [id number] DOB: [birth year] Today's Date: 06/30/2014    History of Present Illness 74 year old male who has presented 5/20 for stage 1 of a 2 stage procedure. Stage one included the anterolateral interbody fusions at L2-3 and L3-4.  5/23 he was taken for stage 2 which included T10 to ilium fusion including decompressive laminectomies and foraminotomies at L4-5 and L5-S1. Post operatively he remained on ventilator and was sent to ICU, extubated 5/24.     PT Comments    Patient's mobility limited today by lightheadedness and fatigue.  Will return in am.  Follow Up Recommendations  Home health PT;Supervision/Assistance - 24 hour     Equipment Recommendations  Rolling walker with 5" wheels    Recommendations for Other Services       Precautions / Restrictions Precautions Precautions: Back;Fall Precaution Comments: Reviewed back precautions with patient Required Braces or Orthoses: Spinal Brace Spinal Brace: Thoracolumbosacral orthotic;Applied in sitting position Restrictions Weight Bearing Restrictions: No    Mobility  Bed Mobility Overal bed mobility: Needs Assistance Bed Mobility: Rolling;Sidelying to Sit Rolling: Supervision Sidelying to sit: Min guard       General bed mobility comments: Requires increased time for mobility.  Max assist to don TLSO in sitting.  Transfers Overall transfer level: Needs assistance Equipment used: Rolling walker (2 wheeled) Transfers: Sit to/from Stand Sit to Stand: Min assist         General transfer comment: Patient using correct hand placement.  Patient with difficulty powering up to standing today from bed and toilet, requiring assist.  Once upright, steady in stance.  Ambulation/Gait Ambulation/Gait assistance: Min guard Ambulation Distance (Feet): 30 Feet (15' x2 to and from bathroom) Assistive device: Rolling walker (2 wheeled) Gait Pattern/deviations:  Step-through pattern;Decreased stride length Gait velocity: decreased Gait velocity interpretation: Below normal speed for age/gender General Gait Details: Patient demonstrates safe use of RW.  During gait, patient reports feeling "woozy" (lightheaded).  Became unsteady in stance - moved to chair.  Declined further ambulation today due to feeling poorly.   Stairs            Wheelchair Mobility    Modified Rankin (Stroke Patients Only)       Balance                                    Cognition Arousal/Alertness: Awake/alert Behavior During Therapy: WFL for tasks assessed/performed Overall Cognitive Status: Within Functional Limits for tasks assessed                      Exercises      General Comments        Pertinent Vitals/Pain Pain Assessment: 0-10 Pain Score: 4  Pain Location: back Pain Descriptors / Indicators: Aching Pain Intervention(s): Monitored during session;Repositioned    Home Living                      Prior Function            PT Goals (current goals can now be found in the care plan section) Progress towards PT goals: Not progressing toward goals - comment (Due to lightheadedness)    Frequency  Min 5X/week    PT Plan Current plan remains appropriate    Co-evaluation             End of Session Equipment Utilized  During Treatment: Gait belt;Back brace Activity Tolerance: Patient limited by fatigue Patient left: in chair;with call bell/phone within reach     Time: 1630-1659 PT Time Calculation (min) (ACUTE ONLY): 29 min  Charges:  $Gait Training: 23-37 mins                    G Codes:      Despina Pole 07/07/14, 9:08 PM Carita Pian. Sanjuana Kava, [place] Pager [phone]

## 2014-07-01 LAB — GLUCOSE, CAPILLARY
Glucose-Capillary: 113 mg/dL — ABNORMAL HIGH (ref 65–99)
Glucose-Capillary: 153 mg/dL — ABNORMAL HIGH (ref 65–99)
Glucose-Capillary: 156 mg/dL — ABNORMAL HIGH (ref 65–99)
Glucose-Capillary: 119 mg/dL — ABNORMAL HIGH (ref 65–99)

## 2014-07-01 MED ORDER — HYDROMORPHONE HCL 2 MG PO TABS
2.0000 mg | ORAL_TABLET | ORAL | Status: DC | PRN
  Administered 2014-07-01 – 2014-07-03 (×8): 2 mg via ORAL
  Filled 2014-07-01 (×9): qty 1

## 2014-07-01 NOTE — Progress Notes (Signed)
Physical Therapy Treatment Patient Details Name: Stephen Rowe MRN: [id number] DOB: 1940-09-11 Today's Date: 07/01/2014    History of Present Illness 74 year old male who has presented 5/20 for stage 1 of a 2 stage procedure. Stage one included the anterolateral interbody fusions at L2-3 and L3-4.  5/23 he was taken for stage 2 which included T10 to ilium fusion including decompressive laminectomies and foraminotomies at L4-5 and L5-S1. Post operatively he remained on ventilator and was sent to ICU, extubated 5/24.     PT Comments    Pt drowsy after pain meds this am, but was able to increase ambulation distance today.  Pt and wife able to don TLSO with only cues today.  Will continue to follow.    Follow Up Recommendations  Home health PT;Supervision/Assistance - 24 hour     Equipment Recommendations  Rolling walker with 5" wheels    Recommendations for Other Services       Precautions / Restrictions Precautions Precautions: Back;Fall Precaution Comments: pt able to recall 3/3 back precautions.   Required Braces or Orthoses: Spinal Brace Spinal Brace: Thoracolumbosacral orthotic;Applied in sitting position Restrictions Weight Bearing Restrictions: No    Mobility  Bed Mobility Overal bed mobility: Needs Assistance Bed Mobility: Rolling;Sidelying to Sit Rolling: Supervision Sidelying to sit: Min guard       General bed mobility comments: pt demos good technique and is able to perform without physical A.    Transfers Overall transfer level: Needs assistance Equipment used: Rolling walker (2 wheeled) Transfers: Sit to/from Stand Sit to Stand: Min assist         General transfer comment: pt demonstrates good use of UEs, but needs cues to scoot to EOB prior to coming to standing.    Ambulation/Gait Ambulation/Gait assistance: Min guard Ambulation Distance (Feet): 80 Feet Assistive device: Rolling walker (2 wheeled) Gait Pattern/deviations: Step-through  pattern;Decreased stride length;Trunk flexed     General Gait Details: cues for more upright posture and positioning within RW.  pt able to increase ambulation distance.     Stairs            Wheelchair Mobility    Modified Rankin (Stroke Patients Only)       Balance Overall balance assessment: Needs assistance         Standing balance support: Bilateral upper extremity supported;During functional activity Standing balance-Leahy Scale: Poor                      Cognition Arousal/Alertness: Awake/alert Behavior During Therapy: WFL for tasks assessed/performed Overall Cognitive Status: Within Functional Limits for tasks assessed                      Exercises      General Comments        Pertinent Vitals/Pain Pain Assessment: 0-10 Pain Score: 6  Pain Location: Back Pain Descriptors / Indicators: Aching;Constant Pain Intervention(s): Monitored during session;Premedicated before session;Repositioned    Home Living                      Prior Function            PT Goals (current goals can now be found in the care plan section) Acute Rehab PT Goals Patient Stated Goal: regain independence PT Goal Formulation: With patient Time For Goal Achievement: 07/09/14 Potential to Achieve Goals: Good Progress towards PT goals: Progressing toward goals    Frequency  Min 5X/week    PT  Plan Current plan remains appropriate    Co-evaluation             End of Session Equipment Utilized During Treatment: Gait belt;Back brace Activity Tolerance: Patient limited by fatigue;Patient limited by pain Patient left: in chair;with call bell/phone within reach;with family/visitor present     Time: 2336-1224 PT Time Calculation (min) (ACUTE ONLY): 29 min  Charges:  $Gait Training: 8-22 mins $Therapeutic Activity: 8-22 mins                    G CodesCatarina Hartshorn, [place] 07/01/2014, 12:24 PM

## 2014-07-01 NOTE — Progress Notes (Signed)
Patient ID: Stephen Rowe, male   DOB: 03-31-1940, 74 y.o.   MRN: [id number] Sleepy with dialudid. Having more pain than yesterday

## 2014-07-02 LAB — GLUCOSE, CAPILLARY
GLUCOSE-CAPILLARY: 135 mg/dL — AB (ref 65–99)
GLUCOSE-CAPILLARY: 158 mg/dL — AB (ref 65–99)
Glucose-Capillary: 128 mg/dL — ABNORMAL HIGH (ref 65–99)
Glucose-Capillary: 130 mg/dL — ABNORMAL HIGH (ref 65–99)

## 2014-07-02 NOTE — Progress Notes (Signed)
Occupational Therapy Treatment Patient Details Name: Stephen Rowe MRN: [id number] DOB: November 04, 1940 Today's Date: 07/02/2014    History of present illness 74 year old male who has presented 5/20 for stage 1 of a 2 stage procedure. Stage one included the anterolateral interbody fusions at L2-3 and L3-4.  5/23 he was taken for stage 2 which included T10 to ilium fusion including decompressive laminectomies and foraminotomies at L4-5 and L5-S1. Post operatively he remained on ventilator and was sent to ICU, extubated 5/24.    OT comments  Patient progressing towards OT goals, continue plan of care for now. Pt able to verbalize 3/3 back precautions independently and pt's wife able to don TLSO independently. Pt requires assistance for toileting hygiene, recommending toielting aid to increase independence with this. Wife supportive and able to assist patient prn once discharged>home.    Follow Up Recommendations  Home health OT;Supervision/Assistance - 24 hour    Equipment Recommendations  Other (comment) (toileting aid)    Recommendations for Other Services  None at this time   Precautions / Restrictions Precautions Precautions: Back;Fall Precaution Comments: pt able to recall 3/3 back precautions.   Required Braces or Orthoses: Spinal Brace Spinal Brace: Thoracolumbosacral orthotic;Applied in sitting position Restrictions Weight Bearing Restrictions: No     Mobility Bed Mobility Overal bed mobility: Needs Assistance Bed Mobility: Rolling;Sidelying to Sit   Sidelying to sit: Supervision General bed mobility comments: pt demos good technique and is able to perform without physical A.    Transfers Overall transfer level: Needs assistance Equipment used: Rolling walker (2 wheeled) Transfers: Sit to/from Stand Sit to Stand: Min guard  General transfer comment: Cues required for hand placement and safe use of RW    Balance Overall balance assessment: Needs  assistance Sitting-balance support: No upper extremity supported;Feet supported Sitting balance-Leahy Scale: Good     Standing balance support: Bilateral upper extremity supported;During functional activity Standing balance-Leahy Scale: Fair   ADL Overall ADL's : Needs assistance/impaired General ADL Comments: Pt found supine in bed with wife present. Pt aprehensive about working with therapist. Pt engaged in bed mobility with supervision, using bed rails to sit EOB. Wife assisted with donning of TLSO, wife independent with this. Pt then stood with RW and min verbal cues for hand placement & safety. Pt then ambulated into BR for toielt transfer and toileting. Educated pt and wife on use of toileting aid for hygiene needs. At end of session, left pt seated in recliner with all needs within reach and wife present.      Cognition   Behavior During Therapy: WFL for tasks assessed/performed Overall Cognitive Status: Within Functional Limits for tasks assessed                 Pertinent Vitals/ Pain       Pain Assessment: Faces Faces Pain Scale: Hurts even more Pain Location: back during sit<>stands Pain Descriptors / Indicators: Grimacing Pain Intervention(s): Limited activity within patient's tolerance;Monitored during session;Repositioned         Frequency Min 2X/week     Progress Toward Goals  OT Goals(current goals can now be found in the care plan section)  Progress towards OT goals: Progressing toward goals     Plan Discharge plan remains appropriate       End of Session Equipment Utilized During Treatment: Back brace;Rolling walker   Activity Tolerance Patient tolerated treatment well   Patient Left in chair;with call bell/phone within reach;with family/visitor present    Time: 2330-0762 OT Time Calculation (min): 25  min  Charges: OT General Charges $OT Visit: 1 Procedure OT Treatments $Self Care/Home Management : 23-37 mins  Shanekia Latella , MS, OTR/L,  [facility] Pager: [phone]  07/02/2014, 3:45 PM

## 2014-07-02 NOTE — Progress Notes (Signed)
Physical Therapy Treatment Patient Details Name: Stephen Rowe MRN: [id number] DOB: July 16, 1940 Today's Date: 07/02/2014    History of Present Illness 74 year old male who has presented 5/20 for stage 1 of a 2 stage procedure. Stage one included the anterolateral interbody fusions at L2-3 and L3-4.  5/23 he was taken for stage 2 which included T10 to ilium fusion including decompressive laminectomies and foraminotomies at L4-5 and L5-S1. Post operatively he remained on ventilator and was sent to ICU, extubated 5/24.     PT Comments    Pt demonstrates decreased need for A today, but overall mobility limited by feeling "woozie".  Pt able to perform stair gait with wife A and will be ready for D/C from PT stand point.  Will continue to follow while on acute.    Follow Up Recommendations  Home health PT;Supervision/Assistance - 24 hour     Equipment Recommendations  Rolling walker with 5" wheels    Recommendations for Other Services       Precautions / Restrictions Precautions Precautions: Back;Fall Precaution Comments: pt able to recall 3/3 back precautions.   Required Braces or Orthoses: Spinal Brace Spinal Brace: Thoracolumbosacral orthotic;Applied in sitting position Restrictions Weight Bearing Restrictions: No    Mobility  Bed Mobility Overal bed mobility: Needs Assistance Bed Mobility: Rolling;Sidelying to Sit Rolling: Supervision Sidelying to sit: Supervision       General bed mobility comments: pt demos good technique and is able to perform without physical A.    Transfers Overall transfer level: Needs assistance Equipment used: Rolling walker (2 wheeled) Transfers: Sit to/from Stand Sit to Stand: Min guard         General transfer comment: pt demonstrates good use of UEs, but needs cues to scoot to EOB prior to coming to standing.    Ambulation/Gait Ambulation/Gait assistance: Min guard Ambulation Distance (Feet): 10 Feet (x3) Assistive device: Rolling  walker (2 wheeled) Gait Pattern/deviations: Step-through pattern;Decreased stride length     General Gait Details: pt demonstrates good use of RW today, but is limited by feeling "woozie".  Nsg tech in to check vitals at end of session.     Stairs Stairs: Yes Stairs assistance: Min assist Stair Management: No rails;Step to pattern;Backwards;With walker Number of Stairs: 3 General stair comments: cues for safe technique with pt and wife performing stairs together.  Provided handout.    Wheelchair Mobility    Modified Rankin (Stroke Patients Only)       Balance Overall balance assessment: Needs assistance         Standing balance support: Bilateral upper extremity supported;During functional activity Standing balance-Leahy Scale: Poor                      Cognition Arousal/Alertness: Awake/alert Behavior During Therapy: WFL for tasks assessed/performed Overall Cognitive Status: Within Functional Limits for tasks assessed                      Exercises      General Comments        Pertinent Vitals/Pain Pain Assessment: 0-10 Pain Score: 4  Pain Location: Back Pain Descriptors / Indicators: Aching Pain Intervention(s): Monitored during session;Premedicated before session;Repositioned    Home Living                      Prior Function            PT Goals (current goals can now be found in the care plan  section) Acute Rehab PT Goals Patient Stated Goal: regain independence PT Goal Formulation: With patient Time For Goal Achievement: 07/09/14 Potential to Achieve Goals: Good Progress towards PT goals: Progressing toward goals    Frequency  Min 5X/week    PT Plan Current plan remains appropriate    Co-evaluation             End of Session Equipment Utilized During Treatment: Gait belt;Back brace Activity Tolerance:  (pt c/o feeling "woozie") Patient left: in chair;with call bell/phone within reach;with family/visitor  present     Time: 1610-9604 PT Time Calculation (min) (ACUTE ONLY): 36 min  Charges:  $Gait Training: 23-37 mins                    G CodesCatarina Hartshorn, [employer] 07/02/2014, 10:37 AM

## 2014-07-02 NOTE — Progress Notes (Signed)
Subjective: Patient reports Patient doing well no leg pain some numbness anterior quad back pain well controlled on pills.  Objective: Vital signs in last 24 hours: Temp:  [97.8 F (36.6 C)-98.7 F (37.1 C)] 98 F (36.7 C) (05/31 0526) Pulse Rate:  [65-92] 86 (05/31 0526) Resp:  [17-20] 19 (05/31 0526) BP: (86-114)/(50-74) 112/69 mmHg (05/31 0526) SpO2:  [94 %-97 %] 95 % (05/31 0526)  Intake/Output from previous day: 05/30 0701 - 05/31 0700 In: 480 [P.O.:480] Out: -  Intake/Output this shift:    Strength out of 5 wound clean dry and intact  Lab Results: No results for input(s): WBC, HGB, HCT, PLT in the last 72 hours. BMET No results for input(s): NA, K, CL, CO2, GLUCOSE, BUN, CREATININE, CALCIUM in the last 72 hours.  Studies/Results: No results found.  Assessment/Plan: Continue mobilizes today with physical and occupational therapy discharge planning will work towards discharge tomorrow with home health  LOS: 11 days     Ulmer 07/02/2014, 7:44 AM

## 2014-07-03 LAB — GLUCOSE, CAPILLARY
Glucose-Capillary: 138 mg/dL — ABNORMAL HIGH (ref 65–99)
Glucose-Capillary: 106 mg/dL — ABNORMAL HIGH (ref 65–99)

## 2014-07-03 MED ORDER — HYDROMORPHONE HCL 2 MG PO TABS: 2.0000 mg | ORAL_TABLET | ORAL | Status: DC | PRN

## 2014-07-03 NOTE — Discharge Summary (Signed)
Physician Discharge Summary  Patient ID: Stephen Rowe MRN: [id number] DOB/AGE: 1940/12/03 74 y.o.  Admit date: 06/21/2014 Discharge date: 07/03/2014  Admission Diagnoses: Degenerative scoliosis lumbar spinal stenosis spondylolisthesis L5-S1  Discharge Diagnoses: Same Active Problems:   Scoliosis of lumbar spine   Spondylolisthesis at L5-S1 level   Discharged Condition: good  Hospital Course: Patient is admitted to the hospital underwent an anterolateral lumbar fusion at L2-3 and L3-4 postoperative patient was sent to the floor recovered over the weekend was taken back to the OR for a T10-L1 fusion with decompression at L4-5 and L5-S1. Postoperative patient did fairly well with recovered in the ICU to very well the ICU had some evidence of hypotension which was treated with fluid boluses and holding his antihypertensives. Progressively oval mobilized over the next several days pain became under much better control had a slight postoperative ileus which resolved blood counts remained stable and patient was stable for discharge home on postop day 9.  Consults: Significant Diagnostic Studies: Treatments: T10 S1 decompression stabilization procedure.Discharge Exam: Blood pressure 116/68, pulse 87, temperature 98.6 F (37 C), temperature source Oral, resp. rate 18, height 5\' 10"  (1.778 m), weight 94.348 kg (208 lb), SpO2 97 %. Awake alert oriented strength out of 5 wound clean dry and intact. me  Disposition: Home  Discharge Instructions    Face-to-face encounter (required for Medicare/Medicaid patients)    Complete by:  As directed   I Shilo Pauwels P certify that this patient is under my care and that I, or a nurse practitioner or physician's assistant working with me, had a face-to-face encounter that meets the physician face-to-face encounter requirements with this patient on 07/03/2014. The encounter with the patient was in whole, or in part for the following medical condition(s) which is  the primary reason for home health care (List medical condition): scoliosis  The encounter with the patient was in whole, or in part, for the following medical condition, which is the primary reason for home health care:  scoliosis  I certify that, based on my findings, the following services are medically necessary home health services:  Physical therapy  Reason for Medically Necessary Home Health Services:  Therapy- Therapeutic Exercises to Increase Strength and Endurance  My clinical findings support the need for the above services:  Pain interferes with ambulation/mobility  Further, I certify that my clinical findings support that this patient is homebound due to:  Pain interferes with ambulation/mobility     Home Health    Complete by:  As directed   To provide the following care/treatments:  PT            Medication List    TAKE these medications        aspirin EC 81 MG tablet  Take 81 mg by mouth daily.     diazepam 10 MG tablet  Commonly known as:  VALIUM  Take 10 mg by mouth every 6 (six) hours as needed (Muscle spasm).     HYDROmorphone 2 MG tablet  Commonly known as:  DILAUDID  Take 1 tablet (2 mg total) by mouth every 4 (four) hours as needed for severe pain.     oxyCODONE-acetaminophen 5-325 MG per tablet  Commonly known as:  PERCOCET/ROXICET  Take 1 tablet by mouth every 4 (four) hours as needed for severe pain.     rosuvastatin 10 MG tablet  Commonly known as:  CRESTOR  Take 10 mg by mouth daily.           Follow-up  Information    Follow up with [facility], MD.   Specialty:  Neurosurgery   Contact information:   [address] [place] [address] [phone]       Signed: Elaina Hoops 07/03/2014, 10:53 AM

## 2014-07-03 NOTE — Discharge Instructions (Signed)
No lifting no bending no twisting no driving. Keep incision clean dry and intact. Cover incision with Saran wrap for showers only. No sitting longer than 30-45 minutes at a time. Ambulate multiple times a day for more no more than 10 minutes at a time.

## 2014-07-03 NOTE — Progress Notes (Signed)
Pt discharging with his wife alert, verbal taking all personal belongings. No noted distress. IV discontinued, dry dressing applied. Discharge instructions provided with prescription with verbal understanding. Pt made aware of follow up appt.

## 2014-07-03 NOTE — Progress Notes (Signed)
Patient was discharged prior to being seen by CM.  Voicemail was left regarding arranging home health PT.  Awaiting return call.

## 2014-07-03 NOTE — Progress Notes (Signed)
PT Cancellation Note  Patient Details Name: Stephen Rowe MRN: [id number] DOB: [birth year]   Cancelled Treatment:    Reason Eval/Treat Not Completed: Pain limiting ability to participate.  Pt very painful and indicates received pain meds 2hrs late and is still awaiting steroids.  Pt indicates he has already spoken with RN.  Will f/u another time.     Jakarie Pember, Thornton Papas 07/03/2014, 9:30 AM

## 2014-07-03 NOTE — Progress Notes (Signed)
Patient ID: Stephen Rowe, male   DOB: [birth year], 74 y.o.   MRN: [id number] Doing well no leg pain back pain well-controlled  Neurologically stable Discharge home

## 2014-07-04 NOTE — Progress Notes (Signed)
Spoke with patient's wife, who is agreeable to home health therapy.  She has chosen [facility]. Miranda with [facility] was notified and has accepted the referral.

## 2014-07-05 DIAGNOSIS — Z4789 Encounter for other orthopedic aftercare: Secondary | ICD-10-CM | POA: Diagnosis not present

## 2014-07-05 DIAGNOSIS — M4186 Other forms of scoliosis, lumbar region: Secondary | ICD-10-CM | POA: Diagnosis not present

## 2014-07-08 DIAGNOSIS — Z4789 Encounter for other orthopedic aftercare: Secondary | ICD-10-CM | POA: Diagnosis not present

## 2014-07-08 DIAGNOSIS — M4186 Other forms of scoliosis, lumbar region: Secondary | ICD-10-CM | POA: Diagnosis not present

## 2014-07-09 DIAGNOSIS — M4310 Spondylolisthesis, site unspecified: Secondary | ICD-10-CM | POA: Diagnosis not present

## 2014-07-10 DIAGNOSIS — Z4789 Encounter for other orthopedic aftercare: Secondary | ICD-10-CM | POA: Diagnosis not present

## 2014-07-10 DIAGNOSIS — M4186 Other forms of scoliosis, lumbar region: Secondary | ICD-10-CM | POA: Diagnosis not present

## 2014-07-12 DIAGNOSIS — M4186 Other forms of scoliosis, lumbar region: Secondary | ICD-10-CM | POA: Diagnosis not present

## 2014-07-12 DIAGNOSIS — Z4789 Encounter for other orthopedic aftercare: Secondary | ICD-10-CM | POA: Diagnosis not present

## 2014-07-15 DIAGNOSIS — Z4789 Encounter for other orthopedic aftercare: Secondary | ICD-10-CM | POA: Diagnosis not present

## 2014-07-15 DIAGNOSIS — M4186 Other forms of scoliosis, lumbar region: Secondary | ICD-10-CM | POA: Diagnosis not present

## 2014-07-17 DIAGNOSIS — Z4789 Encounter for other orthopedic aftercare: Secondary | ICD-10-CM | POA: Diagnosis not present

## 2014-07-17 DIAGNOSIS — M4186 Other forms of scoliosis, lumbar region: Secondary | ICD-10-CM | POA: Diagnosis not present

## 2014-07-18 DIAGNOSIS — M4186 Other forms of scoliosis, lumbar region: Secondary | ICD-10-CM | POA: Diagnosis not present

## 2014-07-18 DIAGNOSIS — Z4789 Encounter for other orthopedic aftercare: Secondary | ICD-10-CM | POA: Diagnosis not present

## 2014-07-24 DIAGNOSIS — M4186 Other forms of scoliosis, lumbar region: Secondary | ICD-10-CM | POA: Diagnosis not present

## 2014-07-24 DIAGNOSIS — Z4789 Encounter for other orthopedic aftercare: Secondary | ICD-10-CM | POA: Diagnosis not present

## 2014-07-25 DIAGNOSIS — M4120 Other idiopathic scoliosis, site unspecified: Secondary | ICD-10-CM | POA: Diagnosis not present

## 2014-07-26 DIAGNOSIS — M4186 Other forms of scoliosis, lumbar region: Secondary | ICD-10-CM | POA: Diagnosis not present

## 2014-07-26 DIAGNOSIS — Z4789 Encounter for other orthopedic aftercare: Secondary | ICD-10-CM | POA: Diagnosis not present

## 2014-07-30 DIAGNOSIS — Z4789 Encounter for other orthopedic aftercare: Secondary | ICD-10-CM | POA: Diagnosis not present

## 2014-07-30 DIAGNOSIS — M4186 Other forms of scoliosis, lumbar region: Secondary | ICD-10-CM | POA: Diagnosis not present

## 2014-08-01 DIAGNOSIS — Z4789 Encounter for other orthopedic aftercare: Secondary | ICD-10-CM | POA: Diagnosis not present

## 2014-08-01 DIAGNOSIS — M4186 Other forms of scoliosis, lumbar region: Secondary | ICD-10-CM | POA: Diagnosis not present

## 2014-09-10 DIAGNOSIS — M4120 Other idiopathic scoliosis, site unspecified: Secondary | ICD-10-CM | POA: Diagnosis not present

## 2014-11-12 DIAGNOSIS — M4120 Other idiopathic scoliosis, site unspecified: Secondary | ICD-10-CM | POA: Diagnosis not present

## 2014-11-12 DIAGNOSIS — Z6828 Body mass index (BMI) 28.0-28.9, adult: Secondary | ICD-10-CM | POA: Diagnosis not present

## 2014-11-12 DIAGNOSIS — M5137 Other intervertebral disc degeneration, lumbosacral region: Secondary | ICD-10-CM | POA: Diagnosis not present

## 2014-11-18 ENCOUNTER — Ambulatory Visit (HOSPITAL_COMMUNITY): Payer: Medicare Other | Attending: Neurosurgery | Admitting: Physical Therapy

## 2014-11-18 DIAGNOSIS — R293 Abnormal posture: Secondary | ICD-10-CM | POA: Diagnosis not present

## 2014-11-18 DIAGNOSIS — M6281 Muscle weakness (generalized): Secondary | ICD-10-CM | POA: Diagnosis not present

## 2014-11-18 DIAGNOSIS — R262 Difficulty in walking, not elsewhere classified: Secondary | ICD-10-CM | POA: Insufficient documentation

## 2014-11-18 DIAGNOSIS — M4120 Other idiopathic scoliosis, site unspecified: Secondary | ICD-10-CM

## 2014-11-18 DIAGNOSIS — M5442 Lumbago with sciatica, left side: Secondary | ICD-10-CM | POA: Diagnosis not present

## 2014-11-18 DIAGNOSIS — R6889 Other general symptoms and signs: Secondary | ICD-10-CM | POA: Insufficient documentation

## 2014-11-18 DIAGNOSIS — R2681 Unsteadiness on feet: Secondary | ICD-10-CM | POA: Diagnosis not present

## 2014-11-18 DIAGNOSIS — M5441 Lumbago with sciatica, right side: Secondary | ICD-10-CM | POA: Diagnosis not present

## 2014-11-18 NOTE — Therapy (Signed)
[place], [name], [address] Phone: [phone]   Fax:  [phone]  Physical Therapy Evaluation  Patient Details  Name: Stephen Rowe MRN: [id number] Date of Birth: 29-Mar-1940 Referring Provider: Dr. Kary Kos   Encounter Date: 11/18/2014      PT End of Session - 11/18/14 0940    Visit Number 1   Number of Visits 16   Date for PT Re-Evaluation 12/16/14   Authorization Type [employer] Medicare    Authorization Time Period 11/18/14 to 01/18/15   Authorization - Visit Number 1   Authorization - Number of Visits 10   PT Start Time 8563   PT Stop Time 0933   PT Time Calculation (min) 49 min   Activity Tolerance Patient tolerated treatment well   Behavior During Therapy [facility] for tasks assessed/performed      Past Medical History  Diagnosis Date  . Arthritis   . Prostatitis   . Hyperlipidemia   . History of kidney stones   . GERD (gastroesophageal reflux disease)   . PONV (postoperative nausea and vomiting)   . Bulging disc     at L4 and L5  . Prostate nodule   . Skin cancer of face   . CAD (coronary artery disease)     3 blockages, unchanged in past 15 years, s/p cardiac cath X 2 but no stents    Past Surgical History  Procedure Laterality Date  . Kidney stone surgery    . Rotator cuff repair      right  . Cholecystectomy    . Knee surgery      right  . Hand surgery      right  . Skin cancer excision      left ear  . Colonoscopy  11/18/2011    Procedure: COLONOSCOPY;  Surgeon: Daneil Dolin, MD;  Location: [facility];  Service: Endoscopy;  Laterality: N/A;  8:45  . Knee surgery Right   . Joint replacement    . Cardiac catheterization      X 2; last 01/17/2003 ([facility]): 50% pLAD, 20% pOM1, 15% ostial LM. NL LVF. REC: [facility] (Dr. Elonda Husky)  . Anterior lat lumbar fusion N/A 06/21/2014    Procedure: ANTERIOR LATERAL LUMBAR FUSION Lumbar two-three, Lumbar three-four ;  Surgeon: Kary Kos, MD;   Location: [place] [facility]UR[facility] ORS;  Service: Neurosurgery;  Laterality: N/A;  . Application of intraoperative ct scan N/A 06/24/2014    Procedure: APPLICATION OF INTRAOPERATIVE CT SCAN;  Surgeon: Kary Kos, MD;  Location: [place] NEURO ORS;  Service: Neurosurgery;  Laterality: N/A;    There were no vitals filed for this visit.  Visit Diagnosis:  Idiopathic scoliosis - Plan: PT plan of care cert/re-cert  Bilateral low back pain with sciatica, sciatica laterality unspecified - Plan: PT plan of care cert/re-cert  Poor posture - Plan: PT plan of care cert/re-cert  Unsteadiness - Plan: PT plan of care cert/re-cert  Muscle weakness - Plan: PT plan of care cert/re-cert  Decreased functional activity tolerance - Plan: PT plan of care cert/re-cert  Difficulty walking - Plan: PT plan of care cert/re-cert      Subjective Assessment - 11/18/14 0849    Subjective Patient reports that he has been having a lot of pain, especially when he tries to bend over and get something. If he is more active during the day, his back will hurt more. He is still wearing a corset brace for his back but is out  of the turtle shell. Reports that MD told him to do what he can as long as it is not painful however does not want him to be too aggressive with range of motion just yet.    Pertinent History Patient reports that his pain has been chronic and about 3 years ago went to neurosurgeon, who found that vertebrae were pinching nerves. He was having problems with his leg going to sleep and a lot of pain in his leg and hip. Tried shots first, which did not help; also received some exercises from a therapist before surgery but found that this was just aggravating his back; had surgeries to correct his back in May/June of 2016 during which he went into heart failure and had to go into ICU/life support at one point. He was in  the hospital 12 days overall.    How long can you sit comfortably? Depends on chair- can sit an hour or two with a  pillow supporting his back   How long can you stand comfortably? 30 minutes before pain starts to shoot up    How long can you walk comfortably? Immediate discomfort, starts to increase in pain after 15-20 minutes    Patient Stated Goals build leg strength, be able to move better and walk more without as much discomfort    Currently in Pain? Yes   Pain Score 4    Pain Location Back   Pain Orientation Mid            [facility] PT Assessment - 11/18/14 0001    Assessment   Medical Diagnosis scoliosis, idiopathic    Referring Provider Dr. Kary Kos    Onset Date/Surgical Date --  May/June 2016   Next MD Visit January 2017 with Dr. Saintclair Halsted    Precautions   Precautions Back   Precaution Comments extensive surgery and hardware throughout lumbar/lower thoracic spine   Required Braces or Orthoses Other Brace/Splint   Other Brace/Splint corset brace    Restrictions   Weight Bearing Restrictions No   Balance Screen   Has the patient fallen in the past 6 months No   Has the patient had a decrease in activity level because of a fear of falling?  Yes   Is the patient reluctant to leave their home because of a fear of falling?  No   Observation/Other Assessments   Focus on Therapeutic Outcomes (FOTO)  79% limited    Posture/Postural Control   Posture Comments forward head with B IR shoulders, flexed at hips, varus knees, still some slight scoliotic curve noted as evidenced by slight height difference of shoulders    AROM   Overall AROM Comments waived spinal range of motion due to brace and nature of surgery    Right Hip External Rotation  55   Right Hip Internal Rotation  25   Left Hip External Rotation  55   Left Hip Internal Rotation  30   Strength   Right Hip Flexion 4-/5   Right Hip ABduction 4-/5   Left Hip Flexion 4-/5   Left Hip ABduction 3+/5   Right Knee Flexion 4-/5   Right Knee Extension 4-/5   Left Knee Flexion 4-/5   Left Knee Extension 4-/5   Right Ankle Dorsiflexion 5/5    Left Ankle Dorsiflexion 5/5   Flexibility   Hamstrings moderate tightness   Piriformis mild tightness    Ambulation/Gait   Gait Comments reduced gait speed, reduced roration of trunk and pelvis, bilateral hip ER,  flexed at hips    6 minute walk test results    Endurance additional comments 6 MWT 1211ft, 1.4m/s    High Level Balance   High Level Balance Comments TUG 12.4, 12.0, 11.1 with no device                            PT Education - 11/18/14 0939    Education provided Yes   Education Details prognosis, plan of care moving forward,  HEP, encouraged patient to walk as much as he can tolerate within pain limitations    Person(s) Educated Patient   Methods Explanation;Handout   Comprehension Verbalized understanding          PT Short Term Goals - 11/18/14 1023    PT SHORT TERM GOAL #1   Title Patient will be able to verbally state the importance of good posture and will be independent in maintaining correct posture throughout all functional situation with no cues approximately 80% of the time    Time 4   Period Weeks   Status New   PT SHORT TERM GOAL #2   Title Patient to demonstrate improved functional mechanics and technique during rolling and sit to stand, as evidenced by an ability to perform these transfers with minimal difficulty and no cues for sequencing/appropriate technique    Time 4   Period Weeks   Status New   PT SHORT TERM GOAL #3   Title Patient to be able to perform TUG in 8 seconds with no assistive device and no unsteadiness    Time 4   Period Weeks   Status New   PT SHORT TERM GOAL #4   Title Patient will be independent in correctly and consistently performing appropriate HEP, to be updated PRN    Time 4   Period Weeks   Status New           PT Long Term Goals - 11/18/14 1027    PT LONG TERM GOAL #1   Title Patient to demonstrate strength of 5/5 in all tested muscle groups to reduce pain and improve functional mechanics  and posture    Time 8   Period Weeks   Status New   PT LONG TERM GOAL #2   Title Patient to demonstrate only mild impairment in muscle stiffness and will be independent in functional stretching techniques to perform at home    Time 8   Period Weeks   Status New   PT LONG TERM GOAL #3   Title Patient to be able to ambulate unlimited distances over even and uneven surfaces without assistive device and pain no more than 2/10   Time 8   Period Weeks   Status New   PT LONG TERM GOAL #4   Title Patient to be able to ascend and descend full flight of stairs with no railings, no unsteadiness, good eccentric control, and reciprocal pattern, pain no more than 2/10   Time 8   Period Weeks   Status New   PT LONG TERM GOAL #5   Title Patient to be performing light-moderate intensity exercise for at least 30 minutes, at least 3 times per week in order to assist in maintaining functional gains and improving overall health habits    Time [place] - 11/18/14 0941    Clinical Impression  Statement Patient presents with significant functional stiffness in bilateral hips and low back after extensive back surgery, muscle weakness, postural impairment, impaired functional mobility, mild functional balance and gait impairment, and overall reduced functional activity tolearnce. Patient also displays some significant muscle tightness in bilateral hamstrings and likely quads/hip flexors as well. At this time he appears to be most limited in terms of strength, posture, muscle balance, and functional mobility as rolling and sit to stands are difficult for him. At this time he will benefit from skilled PT services in order to address his functional impairment  and  assist him in reaching  an optimal level of function.    Pt will benefit from skilled therapeutic intervention in order to improve on the following deficits Abnormal gait;Decreased  endurance;Hypomobility;Decreased activity tolerance;Decreased strength;Pain;Decreased balance;Difficulty walking;Improper body mechanics;Decreased coordination;Impaired flexibility;Postural dysfunction   Rehab Potential Good   PT Frequency 2x / week   PT Duration 8 weeks   PT Treatment/Interventions ADLs/Self Care Home Management;Gait training;Stair training;Functional mobility training;Therapeutic activities;Therapeutic exercise;Balance training;Neuromuscular re-education;Patient/family education;Manual techniques   PT Next Visit Plan review HEP and goals; functional strengthening, stretching, and balance training    PT Home Exercise Plan given    Consulted and Agree with Plan of Care Patient          G-Codes - 11/28/2014 1041    Functional Assessment Tool Used FOTO 79% limited however per skilled clinical judgement based on strength, gait, posture, balance, functional activity tolerance estimate limitation to be approximately 45%    Functional Limitation Mobility: Walking and moving around   Mobility: Walking and Moving Around Current Status (F4734) At least 40 percent but less than 60 percent impaired, limited or restricted   Mobility: Walking and Moving Around Goal Status (Y3709) At least 20 percent but less than 40 percent impaired, limited or restricted       Problem List Patient Active Problem List   Diagnosis Date Noted  . 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 Scoliosis of lumbar spine 06/21/2014  . Back pain 07/30/2013  . Encounter for screening colonoscopy 10/26/2011  . RLQ discomfort 10/26/2011  . Reflux 10/26/2011  . Pain in joint, shoulder region 08/12/2010  . Complete rupture of rotator cuff 08/12/2010  . Muscle weakness (generalized) 08/12/2010    Deniece Ree PT, DPT [facility] [address] [place], [name], [address] Phone: [phone]   Fax:  [phone]  Name: Stephen Rowe MRN:  [id number] Date of Birth: 01/23/1941

## 2014-11-18 NOTE — Patient Instructions (Signed)
   BRIDGING  While lying on your back, tighten your lower abdominals, squeeze your buttocks and then raise your buttocks off the floor/bed as creating a "Bridge" with your body.  Repeat 10 times, 2x/day.     HAMSTRING STRETCH WITH TOWEL  While lying down on your back, hook a towel or strap under  your foot and draw up your leg until a stretch is felt under your leg. calf area.  Keep your knee in a straightened position during the stretch.  Hold for 30 seconds, twice each leg, twice a day.    HIP ABDUCTION - SIDELYING  While lying on your side, slowly raise up your top leg to the side. Keep your knee straight and maintain your toes pointed forward the entire time.   The bottom leg can be bent to stabilize your body.  Repeat 5-10 times each leg, twice a day.  TUMMY SQUEEZES   Squeeze your stomach muscles so it feels likeyou are sucking your belly button to your spine; hold for 2 seconds and release. Repeat 20 times, 5 times per day.

## 2014-11-20 ENCOUNTER — Ambulatory Visit (HOSPITAL_COMMUNITY): Payer: Medicare Other | Admitting: Physical Therapy

## 2014-11-20 DIAGNOSIS — R262 Difficulty in walking, not elsewhere classified: Secondary | ICD-10-CM

## 2014-11-20 DIAGNOSIS — R293 Abnormal posture: Secondary | ICD-10-CM | POA: Diagnosis not present

## 2014-11-20 DIAGNOSIS — M5442 Lumbago with sciatica, left side: Secondary | ICD-10-CM

## 2014-11-20 DIAGNOSIS — M6281 Muscle weakness (generalized): Secondary | ICD-10-CM | POA: Diagnosis not present

## 2014-11-20 DIAGNOSIS — M4120 Other idiopathic scoliosis, site unspecified: Secondary | ICD-10-CM

## 2014-11-20 DIAGNOSIS — R2681 Unsteadiness on feet: Secondary | ICD-10-CM

## 2014-11-20 DIAGNOSIS — M5441 Lumbago with sciatica, right side: Secondary | ICD-10-CM | POA: Diagnosis not present

## 2014-11-20 DIAGNOSIS — R6889 Other general symptoms and signs: Secondary | ICD-10-CM | POA: Diagnosis not present

## 2014-11-20 NOTE — Therapy (Signed)
[place], [name], [address] Phone: [phone]   Fax:  [phone]  Physical Therapy Treatment  Patient Details  Name: Stephen Rowe MRN: [id number] Date of Birth: [birth year] Referring Provider: Dr. Kary Kos   Encounter Date: 11/20/2014      PT End of Session - 11/20/14 0849    Visit Number 2   Number of Visits 16   Date for PT Re-Evaluation 12/16/14   Authorization Type [employer] Medicare    Authorization Time Period 11/18/14 to 01/18/15   Authorization - Visit Number 2   Authorization - Number of Visits 10   PT Start Time 0802   PT Stop Time 0842   PT Time Calculation (min) 40 min   Activity Tolerance Patient tolerated treatment well   Behavior During Therapy [facility] for tasks assessed/performed      Past Medical History  Diagnosis Date  . Arthritis   . Prostatitis   . Hyperlipidemia   . History of kidney stones   . GERD (gastroesophageal reflux disease)   . PONV (postoperative nausea and vomiting)   . Bulging disc     at L4 and L5  . Prostate nodule   . Skin cancer of face   . CAD (coronary artery disease)     3 blockages, unchanged in past 15 years, s/p cardiac cath X 2 but no stents    Past Surgical History  Procedure Laterality Date  . Kidney stone surgery    . Rotator cuff repair      right  . Cholecystectomy    . Knee surgery      right  . Hand surgery      right  . Skin cancer excision      left ear  . Colonoscopy  11/18/2011    Procedure: COLONOSCOPY;  Surgeon: Daneil Dolin, MD;  [facility];  Service: Endoscopy;  Laterality: N/A;  8:45  . Knee surgery Right   . Joint replacement    . Cardiac catheterization      X 2; last 01/17/2003 ([facility]): 50% pLAD, 20% pOM1, 15% ostial LM. NL LVF. REC: Medical Tx (Dr. Elonda Husky)  . Anterior lat lumbar fusion N/A 06/21/2014    Procedure: ANTERIOR LATERAL LUMBAR FUSION Lumbar two-three, Lumbar three-four ;  Surgeon: Kary Kos, MD;   Location: [place] [facility]UR[facility] ORS;  Service: Neurosurgery;  Laterality: N/A;  . Application of intraoperative ct scan N/A 06/24/2014    Procedure: APPLICATION OF INTRAOPERATIVE CT SCAN;  Surgeon: Kary Kos, MD;  Location: [place] NEURO ORS;  Service: Neurosurgery;  Laterality: N/A;    There were no vitals filed for this visit.  Visit Diagnosis:  Idiopathic scoliosis  Bilateral low back pain with sciatica, sciatica laterality unspecified  Poor posture  Unsteadiness  Muscle weakness  Decreased functional activity tolerance  Difficulty walking      Subjective Assessment - 11/20/14 0847    Subjective Pt states he has most discomfort with mobility and first thing in the morning.  currently with 3/10 mid LBP.  Reports compliance with HEP.    Currently in Pain? Yes   Pain Score 3    Pain Location Back   Pain Orientation Mid   Pain Descriptors / Indicators Aching                         [facility] Adult PT Treatment/Exercise - 11/20/14 0808    Knee/Hip Exercises: Stretches   Active Hamstring  Stretch Both;3 reps;30 seconds   Active Hamstring Stretch Limitations 12" box   Knee: Self-Stretch Limitations     Piriformis Stretch Both;1 rep;30 seconds   Piriformis Stretch Limitations seated for HEP   Gastroc Stretch 3 reps;30 seconds   Gastroc Stretch Limitations slant board   Knee/Hip Exercises: Standing   Forward Lunges Both;10 reps   Forward Lunges Limitations 4" riser 1 UE   Functional Squat 10 reps   Other Standing Knee Exercises postural 3 green theraband 10 reps   Knee/Hip Exercises: Seated   Sit to Sand 10 reps;without UE support   Knee/Hip Exercises: Supine   Bridges 10 reps   Straight Leg Raises Both;10 reps   Other Supine Knee/Hip Exercises abdominal set 10reps   Other Supine Knee/Hip Exercises clams 10reps                PT Education - 11/20/14 4403    Education provided Yes   Education Details review of HEP, discussion of goals and review of initial  evaluation.  Walking parameters for home discussed.    Person(s) Educated Patient   Methods Explanation;Demonstration;Handout   Comprehension Verbalized understanding;Returned demonstration;Need further instruction          PT Short Term Goals - 11/20/14 0843    PT SHORT TERM GOAL #1   Title Patient will be able to verbally state the importance of good posture and will be independent in maintaining correct posture throughout all functional situation with no cues approximately 80% of the time    Time 4   Period Weeks   Status On-going   PT SHORT TERM GOAL #2   Title Patient to demonstrate improved functional mechanics and technique during rolling and sit to stand, as evidenced by an ability to perform these transfers with minimal difficulty and no cues for sequencing/appropriate technique    Time 4   Period Weeks   Status On-going   PT SHORT TERM GOAL #3   Title Patient to be able to perform TUG in 8 seconds with no assistive device and no unsteadiness    Time 4   Period Weeks   Status On-going   PT SHORT TERM GOAL #4   Title Patient will be independent in correctly and consistently performing appropriate HEP, to be updated PRN    Time 4   Period Weeks   Status New           PT Long Term Goals - 11/20/14 0844    PT LONG TERM GOAL #1   Title Patient to demonstrate strength of 5/5 in all tested muscle groups to reduce pain and improve functional mechanics and posture    Time 8   Period Weeks   Status On-going   PT LONG TERM GOAL #2   Title Patient to demonstrate only mild impairment in muscle stiffness and will be independent in functional stretching techniques to perform at home    Time 8   Period Weeks   Status On-going   PT LONG TERM GOAL #3   Title Patient to be able to ambulate unlimited distances over even and uneven surfaces without assistive device and pain no more than 2/10   Time 8   Period Weeks   Status On-going   PT LONG TERM GOAL #4   Title Patient to  be able to ascend and descend full flight of stairs with no railings, no unsteadiness, good eccentric control, and reciprocal pattern, pain no more than 2/10   Time 8   Period  Weeks   Status On-going   PT LONG TERM GOAL #5   Title Patient to be performing light-moderate intensity exercise for at least 30 minutes, at least 3 times per week in order to assist in maintaining functional gains and improving overall health habits    Time 8   Period Weeks   Status On-going               Plan - 11/20/14 0844    Clinical Impression Statement Pt completing walking of approx 15 minutes at home.  Reviewed HEP with pt requiring only minimal cues to complete correctly, as well as initial evaluation and rehab goals.  Progressed with additional LE strengthening and stretching therex with therapist faciitation to complete.  Pt reported overall improved mobility at end of session wtihout increase in symtoms.    PT Next Visit Plan continue to progress functional strengthening, stretching, and balance training.   Consulted and Agree with Plan of Care Patient        Problem List Patient Active Problem List   Diagnosis Date Noted  . 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 Scoliosis of lumbar spine 06/21/2014  . Back pain 07/30/2013  . Encounter for screening colonoscopy 10/26/2011  . RLQ discomfort 10/26/2011  . Reflux 10/26/2011  . Pain in joint, shoulder region 08/12/2010  . Complete rupture of rotator cuff 08/12/2010  . Muscle weakness (generalized) 08/12/2010    Teena Irani, PTA/CLT [phone]  11/20/2014, 8:49 AM  [place] [address] [place], [name], [address] Phone: [phone]   Fax:  [phone]  Name: Stephen Rowe MRN: [id number] Date of Birth: 02-Jun-1940

## 2014-11-25 ENCOUNTER — Ambulatory Visit (HOSPITAL_COMMUNITY): Payer: Medicare Other | Admitting: Physical Therapy

## 2014-11-25 DIAGNOSIS — R6889 Other general symptoms and signs: Secondary | ICD-10-CM | POA: Diagnosis not present

## 2014-11-25 DIAGNOSIS — M5441 Lumbago with sciatica, right side: Secondary | ICD-10-CM

## 2014-11-25 DIAGNOSIS — R2681 Unsteadiness on feet: Secondary | ICD-10-CM

## 2014-11-25 DIAGNOSIS — R293 Abnormal posture: Secondary | ICD-10-CM | POA: Diagnosis not present

## 2014-11-25 DIAGNOSIS — M4120 Other idiopathic scoliosis, site unspecified: Secondary | ICD-10-CM | POA: Diagnosis not present

## 2014-11-25 DIAGNOSIS — R262 Difficulty in walking, not elsewhere classified: Secondary | ICD-10-CM | POA: Diagnosis not present

## 2014-11-25 DIAGNOSIS — M5442 Lumbago with sciatica, left side: Secondary | ICD-10-CM | POA: Diagnosis not present

## 2014-11-25 DIAGNOSIS — M6281 Muscle weakness (generalized): Secondary | ICD-10-CM | POA: Diagnosis not present

## 2014-11-25 NOTE — Therapy (Signed)
[place], [name], [address] Phone: [phone]   Fax:  [phone]  Physical Therapy Treatment  Patient Details  Name: Stephen Rowe MRN: [id number] Date of Birth: 09/25/1940 Referring Provider: Dr. Kary Kos   Encounter Date: 11/25/2014      PT End of Session - 11/25/14 0926    Visit Number 3   Number of Visits 16   Date for PT Re-Evaluation 12/16/14   Authorization Type [employer]HC Medicare    Authorization Time Period 11/18/14 to 01/18/15   Authorization - Visit Number 3   Authorization - Number of Visits 10   PT Start Time 0800   PT Stop Time 0842   PT Time Calculation (min) 42 min   Activity Tolerance Patient tolerated treatment well   Behavior During Therapy [facility] for tasks assessed/performed      Past Medical History  Diagnosis Date  . Arthritis   . Prostatitis   . Hyperlipidemia   . History of kidney stones   . GERD (gastroesophageal reflux disease)   . PONV (postoperative nausea and vomiting)   . Bulging disc     at L4 and L5  . Prostate nodule   . Skin cancer of face   . CAD (coronary artery disease)     3 blockages, unchanged in past 15 years, s/p cardiac cath X 2 but no stents    Past Surgical History  Procedure Laterality Date  . Kidney stone surgery    . Rotator cuff repair      right  . Cholecystectomy    . Knee surgery      right  . Hand surgery      right  . Skin cancer excision      left ear  . Colonoscopy  11/18/2011    Procedure: COLONOSCOPY;  Surgeon: Daneil Dolin, MD;  [facility];  Service: Endoscopy;  Laterality: N/A;  8:45  . Knee surgery Right   . Joint replacement    . Cardiac catheterization      X 2; last 01/17/2003 ([facility]): 50% pLAD, 20% pOM1, 15% ostial LM. NL LVF. REC: Medical Tx (Dr. Elonda Husky)  . Anterior lat lumbar fusion N/A 06/21/2014    Procedure: ANTERIOR LATERAL LUMBAR FUSION Lumbar two-three, Lumbar three-four ;  Surgeon: Kary Kos, MD;   Location: [place] NEURO ORS;  Service: Neurosurgery;  Laterality: N/A;  . Application of intraoperative ct scan N/A 06/24/2014    Procedure: APPLICATION OF INTRAOPERATIVE CT SCAN;  Surgeon: Kary Kos, MD;  Location: [place] NEURO ORS;  Service: Neurosurgery;  Laterality: N/A;    There were no vitals filed for this visit.  Visit Diagnosis:  Idiopathic scoliosis  Bilateral low back pain with sciatica, sciatica laterality unspecified  Poor posture  Unsteadiness  Muscle weakness  Decreased functional activity tolerance  Difficulty walking      Subjective Assessment - 11/25/14 0805    Subjective Pt states he went to a car show in [place] on Friday and walked about 4-5 miles.  States he has been compliant with HEP but Lt LE is more sore and tight than Rt.  Currently 5/10 in mid lower back without radiculopathy.   Currently in Pain? Yes   Pain Score 5    Pain Location Back   Pain Orientation Mid;Lower   Pain Descriptors / Indicators Aching  [place] Adult PT Treatment/Exercise - 11/25/14 0001    Knee/Hip Exercises: Stretches   Active Hamstring Stretch Both;3 reps;30 seconds   Active Hamstring Stretch Limitations 12" box   Piriformis Stretch Both;1 rep;30 seconds   Piriformis Stretch Limitations seated for HEP   Gastroc Stretch 3 reps;30 seconds   Gastroc Stretch Limitations slant board   Knee/Hip Exercises: Standing   Forward Lunges Both;10 reps   Forward Lunges Limitations 4" riser 1 UE   Side Lunges Both;10 reps   Side Lunges Limitations 4" riser   Functional Squat 10 reps   Other Standing Knee Exercises postural 3 green theraband 10 reps   Knee/Hip Exercises: Seated   Sit to Sand 10 reps;without UE support   Knee/Hip Exercises: Supine   Bridges 10 reps   Straight Leg Raises Both;10 reps   Knee/Hip Exercises: Sidelying   Hip ABduction Both;10 reps   Clams both 10 reps   Knee/Hip Exercises: Prone   Hip Extension 5 reps   Hip Extension  Limitations UE raises   Straight Leg Raises 5 reps                  PT Short Term Goals - 11/20/14 4585    PT SHORT TERM GOAL #1   Title Patient will be able to verbally state the importance of good posture and will be independent in maintaining correct posture throughout all functional situation with no cues approximately 80% of the time    Time 4   Period Weeks   Status On-going   PT SHORT TERM GOAL #2   Title Patient to demonstrate improved functional mechanics and technique during rolling and sit to stand, as evidenced by an ability to perform these transfers with minimal difficulty and no cues for sequencing/appropriate technique    Time 4   Period Weeks   Status On-going   PT SHORT TERM GOAL #3   Title Patient to be able to perform TUG in 8 seconds with no assistive device and no unsteadiness    Time 4   Period Weeks   Status On-going   PT SHORT TERM GOAL #4   Title Patient will be independent in correctly and consistently performing appropriate HEP, to be updated PRN    Time 4   Period Weeks   Status New           PT Long Term Goals - 11/20/14 0844    PT LONG TERM GOAL #1   Title Patient to demonstrate strength of 5/5 in all tested muscle groups to reduce pain and improve functional mechanics and posture    Time 8   Period Weeks   Status On-going   PT LONG TERM GOAL #2   Title Patient to demonstrate only mild impairment in muscle stiffness and will be independent in functional stretching techniques to perform at home    Time 8   Period Weeks   Status On-going   PT LONG TERM GOAL #3   Title Patient to be able to ambulate unlimited distances over even and uneven surfaces without assistive device and pain no more than 2/10   Time 8   Period Weeks   Status On-going   PT LONG TERM GOAL #4   Title Patient to be able to ascend and descend full flight of stairs with no railings, no unsteadiness, good eccentric control, and reciprocal pattern, pain no more  than 2/10   Time 8   Period Weeks   Status On-going   PT LONG  TERM GOAL #5   Title Patient to be performing light-moderate intensity exercise for at least 30 minutes, at least 3 times per week in order to assist in maintaining functional gains and improving overall health habits    Time 8   Period Weeks   Status On-going               Plan - 11/25/14 4492    Clinical Impression Statement PT continues to walk at home and complete HEP.  Instructed to increase reps to 10 with HEP as patient reported still doing 5 reps.  Progresssed stability exericses, adding prone stab and sidelying.  Progressed clams to sidelying requiring manual assistance from therapist for proper alignment and stability of activity.   Noted weakness with prone exericse with minimal ROM achieved UE>LE.  Pt with overall functiona improvement with decreaseing pain.    PT Next Visit Plan continue to progress functional strengthening, stretching, and balance training.   Consulted and Agree with Plan of Care Patient        Problem List Patient Active Problem List   Diagnosis Date Noted  . 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 Scoliosis of lumbar spine 06/21/2014  . Back pain 07/30/2013  . Encounter for screening colonoscopy 10/26/2011  . RLQ discomfort 10/26/2011  . Reflux 10/26/2011  . Pain in joint, shoulder region 08/12/2010  . Complete rupture of rotator cuff 08/12/2010  . Muscle weakness (generalized) 08/12/2010    Teena Irani, PTA/CLT [phone]  11/25/2014, 9:29 AM  [place] [address] [place], [name], [address] Phone: [phone]   Fax:  [phone]  Name: Stephen Rowe MRN: [id number] Date of Birth: 12/08/1940

## 2014-11-28 ENCOUNTER — Ambulatory Visit (HOSPITAL_COMMUNITY): Payer: Medicare Other | Admitting: Physical Therapy

## 2014-11-28 DIAGNOSIS — R262 Difficulty in walking, not elsewhere classified: Secondary | ICD-10-CM | POA: Diagnosis not present

## 2014-11-28 DIAGNOSIS — M6281 Muscle weakness (generalized): Secondary | ICD-10-CM

## 2014-11-28 DIAGNOSIS — M5442 Lumbago with sciatica, left side: Secondary | ICD-10-CM | POA: Diagnosis not present

## 2014-11-28 DIAGNOSIS — M5441 Lumbago with sciatica, right side: Secondary | ICD-10-CM | POA: Diagnosis not present

## 2014-11-28 DIAGNOSIS — R6889 Other general symptoms and signs: Secondary | ICD-10-CM

## 2014-11-28 DIAGNOSIS — R293 Abnormal posture: Secondary | ICD-10-CM

## 2014-11-28 DIAGNOSIS — R2681 Unsteadiness on feet: Secondary | ICD-10-CM

## 2014-11-28 DIAGNOSIS — M4120 Other idiopathic scoliosis, site unspecified: Secondary | ICD-10-CM

## 2014-11-28 NOTE — Therapy (Signed)
[name], [name], [address] Phone: [phone]   Fax:  [phone]  Physical Therapy Treatment  Patient Details  Name: Stephen Rowe MRN: [id number] Date of Birth: Jun 28, 1940 Referring Provider: Dr. Kary Kos   Encounter Date: 11/28/2014      PT End of Session - 11/28/14 0934    Visit Number 4   Number of Visits 16   Date for PT Re-Evaluation 12/16/14   Authorization Type [employer] Medicare    Authorization Time Period 11/18/14 to 01/18/15   Authorization - Visit Number 4   Authorization - Number of Visits 10   PT Start Time 0800   PT Stop Time 0842   PT Time Calculation (min) 42 min   Activity Tolerance Patient tolerated treatment well   Behavior During Therapy [facility] for tasks assessed/performed      Past Medical History  Diagnosis Date  . Arthritis   . Prostatitis   . Hyperlipidemia   . History of kidney stones   . GERD (gastroesophageal reflux disease)   . PONV (postoperative nausea and vomiting)   . Bulging disc     at L4 and L5  . Prostate nodule   . Skin cancer of face   . CAD (coronary artery disease)     3 blockages, unchanged in past 15 years, s/p cardiac cath X 2 but no stents    Past Surgical History  Procedure Laterality Date  . Kidney stone surgery    . Rotator cuff repair      right  . Cholecystectomy    . Knee surgery      right  . Hand surgery      right  . Skin cancer excision      left ear  . Colonoscopy  11/18/2011    Procedure: COLONOSCOPY;  Surgeon: Daneil Dolin, MD;  [facility];  Service: Endoscopy;  Laterality: N/A;  8:45  . Knee surgery Right   . Joint replacement    . Cardiac catheterization      X 2; last 01/17/2003 ([facility]): 50% pLAD, 20% pOM1, 15% ostial LM. NL LVF. REC: Medical Tx (Dr. Elonda Husky)  . Anterior lat lumbar fusion N/A 06/21/2014    Procedure: ANTERIOR LATERAL LUMBAR FUSION Lumbar two-three, Lumbar three-four ;  Surgeon: Kary Kos, MD;   Location: [place] NEURO ORS;  Service: Neurosurgery;  Laterality: N/A;  . Application of intraoperative ct scan N/A 06/24/2014    Procedure: APPLICATION OF INTRAOPERATIVE CT SCAN;  Surgeon: Kary Kos, MD;  Location: [place] NEURO ORS;  Service: Neurosurgery;  Laterality: N/A;    There were no vitals filed for this visit.  Visit Diagnosis:  Idiopathic scoliosis  Bilateral low back pain with sciatica, sciatica laterality unspecified  Poor posture  Unsteadiness  Muscle weakness  Decreased functional activity tolerance  Difficulty walking      Subjective Assessment - 11/28/14 0801    Subjective Patient reports that he is feeling a bit stiff this morning, also having quite a bit of pain yesterday and today. Reports that he feels like he was a bit too active yesterday and that's why he is feeling off this mroning.    Pertinent History Patient reports that his pain has been chronic and about 3 years ago went to neurosurgeon, who found that vertebrae were pinching nerves. He was having problems with his leg going to sleep and a lot of pain in his leg and hip. 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 He was in  the hospital 12 days overall.    Currently in Pain? Yes   Pain Score 6    Pain Location Back   Pain Orientation Lower                         [facility] Adult PT Treatment/Exercise - 11/28/14 0001    Knee/Hip Exercises: Stretches   Active Hamstring Stretch Both;3 reps;30 seconds   Active Hamstring Stretch Limitations 12" box   Hip Flexor Stretch Both;2 reps;30 seconds   Hip Flexor Stretch Limitations 12 inch box    Piriformis Stretch Both;2 reps;20 seconds   Piriformis Stretch Limitations seated    Gastroc Stretch 3 reps;30  seconds   Gastroc Stretch Limitations slant board   Knee/Hip Exercises: Standing   Heel Raises Both;1 set;10 reps   Heel Raises Limitations heel and toe raises    Forward Lunges Both;10 reps   Forward Lunges Limitations 4 inch step    Side Lunges Both;1 set;10 reps   Side Lunges Limitations 4 inch step    Other Standing Knee Exercises 3D hip excursions 1x10   Other Standing Knee Exercises Hip ABD walks 2x27ft    Knee/Hip Exercises: Supine   Bridges 15 reps   Straight Leg Raises Both;1 set;15 reps   Other Supine Knee/Hip Exercises ab sets x20, 3 second holds    Knee/Hip Exercises: Sidelying   Hip ABduction Both;1 set;10 reps             Balance Exercises - 11/28/14 0823    Balance Exercises: Standing   Tandem Stance Eyes open;Foam/compliant surface;3 reps;15 secs   SLS Eyes open;3 reps;Foam/compliant surface   Wall Bumps Shoulder   Wall Bumps-Shoulders Eyes opened;Anterior/posterior;10 reps   Tandem Gait 4 reps;Other (comment);Forward  x45ft, solid surface            PT Education - 11/28/14 0934    Education provided No          PT Short Term Goals - 11/20/14 0843    PT SHORT TERM GOAL #1   Title Patient will be able to verbally state the importance of good posture and will be independent in maintaining correct posture throughout all functional situation with no cues approximately 80% of the time    Time 4   Period Weeks   Status On-going   PT SHORT TERM GOAL #2   Title Patient to demonstrate improved functional mechanics and technique during rolling and sit to stand, as evidenced by an ability to perform these transfers with minimal difficulty and no cues for sequencing/appropriate technique    Time 4   Period Weeks   Status On-going   PT SHORT TERM GOAL #3   Title Patient to be able to perform TUG in 8 seconds with no assistive device and no unsteadiness    Time 4   Period Weeks   Status On-going   PT SHORT TERM GOAL #4   Title Patient will be  independent in correctly and consistently performing appropriate HEP, to be updated PRN    Time 4   Period Weeks   Status New           PT Long Term Goals -  11/20/14 0844    PT LONG TERM GOAL #1   Title Patient to demonstrate strength of 5/5 in all tested muscle groups to reduce pain and improve functional mechanics and posture    Time 8   Period Weeks   Status On-going   PT LONG TERM GOAL #2   Title Patient to demonstrate only mild impairment in muscle stiffness and will be independent in functional stretching techniques to perform at home    Time 8   Period Weeks   Status On-going   PT LONG TERM GOAL #3   Title Patient to be able to ambulate unlimited distances over even and uneven surfaces without assistive device and pain no more than 2/10   Time 8   Period Weeks   Status On-going   PT LONG TERM GOAL #4   Title Patient to be able to ascend and descend full flight of stairs with no railings, no unsteadiness, good eccentric control, and reciprocal pattern, pain no more than 2/10   Time 8   Period Weeks   Status On-going   PT LONG TERM GOAL #5   Title Patient to be performing light-moderate intensity exercise for at least 30 minutes, at least 3 times per week in order to assist in maintaining functional gains and improving overall health habits    Time 8   Period Weeks   Status On-going               Plan - 11/28/14 0934    Clinical Impression Statement Continued to perform functional exercises and stretches today with increase in reps and increase in balance based activities. Patient performed all exercises well today but was challenged by balance tasks on foam. Pain reduced to 4/10  at end of session.    Pt will benefit from skilled therapeutic intervention in order to improve on the following deficits Abnormal gait;Decreased endurance;Hypomobility;Decreased activity tolerance;Decreased strength;Pain;Decreased balance;Difficulty walking;Improper body  mechanics;Decreased coordination;Impaired flexibility;Postural dysfunction   Rehab Potential Good   PT Frequency 2x / week   PT Duration 8 weeks   PT Treatment/Interventions ADLs/Self Care Home Management;Gait training;Stair training;Functional mobility training;Therapeutic activities;Therapeutic exercise;Balance training;Neuromuscular re-education;Patient/family education;Manual techniques   PT Next Visit Plan continue to progress functional strengthening, stretching, and balance training.   PT Home Exercise Plan given    Consulted and Agree with Plan of Care Patient        Problem List Patient Active Problem List   Diagnosis Date Noted  . 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 Scoliosis of lumbar spine 06/21/2014  . Back pain 07/30/2013  . Encounter for screening colonoscopy 10/26/2011  . RLQ discomfort 10/26/2011  . Reflux 10/26/2011  . Pain in joint, shoulder region 08/12/2010  . Complete rupture of rotator cuff 08/12/2010  . Muscle weakness (generalized) 08/12/2010    Deniece Ree PT, DPT [facility] [address] [place], [name], [address] Phone: [phone]   Fax:  [phone]  Name: ANGELES PAOLUCCI MRN: [id number] Date of Birth: 10-15-1940

## 2014-12-02 ENCOUNTER — Ambulatory Visit (HOSPITAL_COMMUNITY): Payer: Medicare Other | Admitting: Physical Therapy

## 2014-12-02 DIAGNOSIS — M6281 Muscle weakness (generalized): Secondary | ICD-10-CM | POA: Diagnosis not present

## 2014-12-02 DIAGNOSIS — R262 Difficulty in walking, not elsewhere classified: Secondary | ICD-10-CM

## 2014-12-02 DIAGNOSIS — M4120 Other idiopathic scoliosis, site unspecified: Secondary | ICD-10-CM

## 2014-12-02 DIAGNOSIS — R293 Abnormal posture: Secondary | ICD-10-CM

## 2014-12-02 DIAGNOSIS — M5441 Lumbago with sciatica, right side: Secondary | ICD-10-CM | POA: Diagnosis not present

## 2014-12-02 DIAGNOSIS — R6889 Other general symptoms and signs: Secondary | ICD-10-CM

## 2014-12-02 DIAGNOSIS — M5442 Lumbago with sciatica, left side: Secondary | ICD-10-CM

## 2014-12-02 DIAGNOSIS — R2681 Unsteadiness on feet: Secondary | ICD-10-CM

## 2014-12-02 NOTE — Therapy (Signed)
[place], [name], [address] Phone: [phone]   Fax:  [phone]  Physical Therapy Treatment  Patient Details  Name: Stephen Rowe MRN: [id number] Date of Birth: December 27, 1940 Referring Provider: Dr. Kary Kos   Encounter Date: 12/02/2014      PT End of Session - 12/02/14 0850    Visit Number 5   Number of Visits 16   Date for PT Re-Evaluation 12/16/14   Authorization Type U[employer] Medicare    Authorization Time Period 11/18/14 to 01/18/15   Authorization - Visit Number 5   Authorization - Number of Visits 10   PT Start Time 0800   PT Stop Time 5427   PT Time Calculation (min) 44 min   Activity Tolerance Patient tolerated treatment well   Behavior During Therapy [facility] for tasks assessed/performed      Past Medical History  Diagnosis Date  . Arthritis   . Prostatitis   . Hyperlipidemia   . History of kidney stones   . GERD (gastroesophageal reflux disease)   . PONV (postoperative nausea and vomiting)   . Bulging disc     at L4 and L5  . Prostate nodule   . Skin cancer of face   . CAD (coronary artery disease)     3 blockages, unchanged in past 15 years, s/p cardiac cath X 2 but no stents    Past Surgical History  Procedure Laterality Date  . Kidney stone surgery    . Rotator cuff repair      right  . Cholecystectomy    . Knee surgery      right  . Hand surgery      right  . Skin cancer excision      left ear  . Colonoscopy  11/18/2011    Procedure: COLONOSCOPY;  Surgeon: Daneil Dolin, MD;  [facility];  Service: Endoscopy;  Laterality: N/A;  8:45  . Knee surgery Right   . Joint replacement    . Cardiac catheterization      X 2; last 01/17/2003 ([facility]): 50% pLAD, 20% pOM1, 15% ostial LM. NL LVF. REC: Medical Tx (Dr. Elonda Husky)  . Anterior lat lumbar fusion N/A 06/21/2014    Procedure: ANTERIOR LATERAL LUMBAR FUSION Lumbar two-three, Lumbar three-four ;  Surgeon: Kary Kos, MD;   Location: [place] [facility];  Service: Neurosurgery;  Laterality: N/A;  . Application of intraoperative ct scan N/A 06/24/2014    Procedure: APPLICATION OF INTRAOPERATIVE CT SCAN;  Surgeon: Kary Kos, MD;  Location: [place] [facility];  Service: Neurosurgery;  Laterality: N/A;    There were no vitals filed for this visit.  Visit Diagnosis:  Idiopathic scoliosis  Bilateral low back pain with sciatica, sciatica laterality unspecified  Poor posture  Unsteadiness  Muscle weakness  Decreased functional activity tolerance  Difficulty walking      Subjective Assessment - 12/02/14 0801    Subjective Patient reports he is doing well this morning, had a good weekend; his back is bothering him a bit this morning but legs feel stronger    Pertinent History Patient reports that his pain has been chronic and about 3 years ago went to neurosurgeon, who found that vertebrae were pinching nerves. He was having problems with his leg going to sleep and a lot of pain in his leg and hip. Tried shots first, which did not help; also received some exercises from a therapist before surgery but found that this was just aggravating  his back; had surgeries to correct his back in May/June of 2016 during which he went into heart failure and had to go into ICU/life support at one point. He was in  the hospital 12 days overall.    Currently in Pain? Yes   Pain Score 4    Pain Location Back   Pain Orientation Lower                         [facility] Adult PT Treatment/Exercise - 12/02/14 0001    Knee/Hip Exercises: Stretches   Active Hamstring Stretch Both;3 reps;30 seconds   Active Hamstring Stretch Limitations 12" box   Hip Flexor Stretch Both;2 reps;30 seconds   Hip Flexor Stretch Limitations 12 inch box    Piriformis Stretch Both;2 reps;20 seconds   Piriformis Stretch Limitations seated    Gastroc Stretch 3 reps;30 seconds   Gastroc Stretch Limitations slant board   Knee/Hip Exercises: Standing    Forward Lunges Both;15 reps   Forward Lunges Limitations 4 inch step    Side Lunges Both;1 set;15 reps   Side Lunges Limitations 4 inch step    Forward Step Up Both;1 set;10 reps   Forward Step Up Limitations 4 inch box    Other Standing Knee Exercises 3D hip excursions 1x10   Other Standing Knee Exercises Hip ABD walks 2x77ft    Knee/Hip Exercises: Supine   Bridges 15 reps   Straight Leg Raises Both;1 set;15 reps   Other Supine Knee/Hip Exercises ab sets x20, 3 second holds    Other Supine Knee/Hip Exercises dead bugs 1x10   Knee/Hip Exercises: Sidelying   Hip ABduction Both;1 set;10 reps   Knee/Hip Exercises: Prone   Straight Leg Raises --   Straight Leg Raises Limitations attempted however patient very uncomfortable and unable to complete              Balance Exercises - 12/02/14 0826    Balance Exercises: Standing   Tandem Stance Eyes open;Foam/compliant surface;3 reps;15 secs   SLS Eyes open;Foam/compliant surface;2 reps   Rockerboard Anterior/posterior;Lateral;Other reps (comment);Other (comment)  x20 each way, no HHA    Retro Gait 4 reps;Other (comment)  63ft            PT Education - 12/02/14 0850    Education provided No          PT Short Term Goals - 11/20/14 0843    PT SHORT TERM GOAL #1   Title Patient will be able to verbally state the importance of good posture and will be independent in maintaining correct posture throughout all functional situation with no cues approximately 80% of the time    Time 4   Period Weeks   Status On-going   PT SHORT TERM GOAL #2   Title Patient to demonstrate improved functional mechanics and technique during rolling and sit to stand, as evidenced by an ability to perform these transfers with minimal difficulty and no cues for sequencing/appropriate technique    Time 4   Period Weeks   Status On-going   PT SHORT TERM GOAL #3   Title Patient to be able to perform TUG in 8 seconds with no assistive device and no  unsteadiness    Time 4   Period Weeks   Status On-going   PT SHORT TERM GOAL #4   Title Patient will be independent in correctly and consistently performing appropriate HEP, to be updated PRN    Time 4   Period  Weeks   Status New           PT Long Term Goals - 11/20/14 0844    PT LONG TERM GOAL #1   Title Patient to demonstrate strength of 5/5 in all tested muscle groups to reduce pain and improve functional mechanics and posture    Time 8   Period Weeks   Status On-going   PT LONG TERM GOAL #2   Title Patient to demonstrate only mild impairment in muscle stiffness and will be independent in functional stretching techniques to perform at home    Time 8   Period Weeks   Status On-going   PT LONG TERM GOAL #3   Title Patient to be able to ambulate unlimited distances over even and uneven surfaces without assistive device and pain no more than 2/10   Time 8   Period Weeks   Status On-going   PT LONG TERM GOAL #4   Title Patient to be able to ascend and descend full flight of stairs with no railings, no unsteadiness, good eccentric control, and reciprocal pattern, pain no more than 2/10   Time 8   Period Weeks   Status On-going   PT LONG TERM GOAL #5   Title Patient to be performing light-moderate intensity exercise for at least 30 minutes, at least 3 times per week in order to assist in maintaining functional gains and improving overall health habits    Time 8   Period Weeks   Status On-going               Plan - 12/02/14 0851    Clinical Impression Statement Continued with functional exercises and stretches today with progression of balance exercises; performed all exercises well and showed improvement in balance today as well. Pain unchanged at end of session.    Pt will benefit from skilled therapeutic intervention in order to improve on the following deficits Abnormal gait;Decreased endurance;Hypomobility;Decreased activity tolerance;Decreased  strength;Pain;Decreased balance;Difficulty walking;Improper body mechanics;Decreased coordination;Impaired flexibility;Postural dysfunction   Rehab Potential Good   PT Frequency 2x / week   PT Duration 8 weeks   PT Treatment/Interventions ADLs/Self Care Home Management;Gait training;Stair training;Functional mobility training;Therapeutic activities;Therapeutic exercise;Balance training;Neuromuscular re-education;Patient/family education;Manual techniques   PT Next Visit Plan continue to progress functional strengthening, stretching, and balance training.   PT Home Exercise Plan given    Consulted and Agree with Plan of Care Patient        Problem List Patient Active Problem List   Diagnosis Date Noted  . 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 Scoliosis of lumbar spine 06/21/2014  . Back pain 07/30/2013  . Encounter for screening colonoscopy 10/26/2011  . RLQ discomfort 10/26/2011  . Reflux 10/26/2011  . Pain in joint, shoulder region 08/12/2010  . Complete rupture of rotator cuff 08/12/2010  . Muscle weakness (generalized) 08/12/2010    Deniece Ree PT, DPT [place] [address] [place], [name], [address] Phone: [phone]   Fax:  [phone]  Name: Stephen Rowe MRN: [id number] Date of Birth: [birth year]

## 2014-12-05 ENCOUNTER — Ambulatory Visit (HOSPITAL_COMMUNITY): Payer: Medicare Other | Attending: Neurosurgery

## 2014-12-05 DIAGNOSIS — R2681 Unsteadiness on feet: Secondary | ICD-10-CM | POA: Diagnosis not present

## 2014-12-05 DIAGNOSIS — M4120 Other idiopathic scoliosis, site unspecified: Secondary | ICD-10-CM | POA: Diagnosis not present

## 2014-12-05 DIAGNOSIS — R293 Abnormal posture: Secondary | ICD-10-CM | POA: Diagnosis not present

## 2014-12-05 DIAGNOSIS — R6889 Other general symptoms and signs: Secondary | ICD-10-CM

## 2014-12-05 DIAGNOSIS — M5442 Lumbago with sciatica, left side: Secondary | ICD-10-CM | POA: Insufficient documentation

## 2014-12-05 DIAGNOSIS — M5441 Lumbago with sciatica, right side: Secondary | ICD-10-CM

## 2014-12-05 DIAGNOSIS — M6281 Muscle weakness (generalized): Secondary | ICD-10-CM | POA: Diagnosis not present

## 2014-12-05 DIAGNOSIS — R262 Difficulty in walking, not elsewhere classified: Secondary | ICD-10-CM

## 2014-12-05 NOTE — Therapy (Signed)
[place], [name], [address] Phone: [phone]   Fax:  [phone]  Physical Therapy Treatment  Patient Details  Name: Stephen Rowe MRN: [id number] Date of Birth: 09-30-1940 Referring Provider: Dr Kary Kos  Encounter Date: 12/05/2014      PT End of Session - 12/05/14 0820    Visit Number 6   Number of Visits 16   Date for PT Re-Evaluation 12/16/14   Authorization Type U[employer] Medicare    Authorization Time Period 11/18/14 to 01/18/15   Authorization - Visit Number 6   Authorization - Number of Visits 10   PT Start Time 0801   PT Stop Time 0845   PT Time Calculation (min) 44 min   Activity Tolerance Patient tolerated treatment well   Behavior During Therapy [facility] for tasks assessed/performed      Past Medical History  Diagnosis Date  . Arthritis   . Prostatitis   . Hyperlipidemia   . History of kidney stones   . GERD (gastroesophageal reflux disease)   . PONV (postoperative nausea and vomiting)   . Bulging disc     at L4 and L5  . Prostate nodule   . Skin cancer of face   . CAD (coronary artery disease)     3 blockages, unchanged in past 15 years, s/p cardiac cath X 2 but no stents    Past Surgical History  Procedure Laterality Date  . Kidney stone surgery    . Rotator cuff repair      right  . Cholecystectomy    . Knee surgery      right  . Hand surgery      right  . Skin cancer excision      left ear  . Colonoscopy  11/18/2011    Procedure: COLONOSCOPY;  Surgeon: Daneil Dolin, MD;  [facility];  Service: Endoscopy;  Laterality: N/A;  8:45  . Knee surgery Right   . Joint replacement    . Cardiac catheterization      X 2; last 01/17/2003 ([facility]): 50% pLAD, 20% pOM1, 15% ostial LM. NL LVF. REC: Medical Tx (Dr. Elonda Husky)  . Anterior lat lumbar fusion N/A 06/21/2014    Procedure: ANTERIOR LATERAL LUMBAR FUSION Lumbar two-three, Lumbar three-four ;  Surgeon: Kary Kos, MD;   Location: [place] [facility];  Service: Neurosurgery;  Laterality: N/A;  . Application of intraoperative ct scan N/A 06/24/2014    Procedure: APPLICATION OF INTRAOPERATIVE CT SCAN;  Surgeon: Kary Kos, MD;  Location: [place] [facility];  Service: Neurosurgery;  Laterality: N/A;    There were no vitals filed for this visit.  Visit Diagnosis:  Idiopathic scoliosis  Bilateral low back pain with sciatica, sciatica laterality unspecified  Poor posture  Unsteadiness  Muscle weakness  Decreased functional activity tolerance  Difficulty walking      Subjective Assessment - 12/05/14 0800    Subjective Pt stated increased back pain today, pain scale 5/10 very achey today.   Currently in Pain? Yes   Pain Score 5    Pain Location Back   Pain Orientation Lower   Pain Descriptors / Indicators Aching            [facility] PT Assessment - 12/05/14 0001    Assessment   Medical Diagnosis scoliosis, idiopathic    Referring Provider Dr Kary Kos   Onset Date/Surgical Date --  May/June 2016   Next MD Visit January 2017 with Dr. Saintclair Halsted  Precautions   Precautions Back   Precaution Comments extensive surgery and hardware throughout lumbar/lower thoracic spine   Required Braces or Orthoses Other Brace/Splint   Other Brace/Splint corset brace                      [facility] Adult PT Treatment/Exercise - 12/05/14 0001    Knee/Hip Exercises: Stretches   Active Hamstring Stretch Both;3 reps;30 seconds   Active Hamstring Stretch Limitations 12" box   Gastroc Stretch 3 reps;30 seconds   Gastroc Stretch Limitations slant board   Other Knee/Hip Stretches Rt Side bend with Lt UE over body 10x   Other Knee/Hip Stretches Lt UE horizational adduction 2x 20"   Knee/Hip Exercises: Standing   Forward Lunges Both;15 reps   Forward Lunges Limitations 4 inch step    Side Lunges Both;1 set;15 reps   Side Lunges Limitations 4 inch step    Step Down Both;10 reps;Hand Hold: 1;Step Height: 4"   Functional  Squat 15 reps   Functional Squat Limitations prior row with GTB   Other Standing Knee Exercises Squat then row Rt arm only with GTB; punches Lt UE BTB   Other Standing Knee Exercises weight shifting and squats (no rotation or extension)             Balance Exercises - 12/05/14 0839    Balance Exercises: Standing   SLS Eyes open;Foam/compliant surface;1 rep  Rt 60"+, Lt 51"    SLS with Vectors Foam/compliant surface;2 reps  5" holds on Airex   Rockerboard Lateral;Other (comment)  2 minutes   Sidestepping 1 rep;Theraband  RTB 1RT down 30 feet             PT Short Term Goals - 11/20/14 0843    PT SHORT TERM GOAL #1   Title Patient will be able to verbally state the importance of good posture and will be independent in maintaining correct posture throughout all functional situation with no cues approximately 80% of the time    Time 4   Period Weeks   Status On-going   PT SHORT TERM GOAL #2   Title Patient to demonstrate improved functional mechanics and technique during rolling and sit to stand, as evidenced by an ability to perform these transfers with minimal difficulty and no cues for sequencing/appropriate technique    Time 4   Period Weeks   Status On-going   PT SHORT TERM GOAL #3   Title Patient to be able to perform TUG in 8 seconds with no assistive device and no unsteadiness    Time 4   Period Weeks   Status On-going   PT SHORT TERM GOAL #4   Title Patient will be independent in correctly and consistently performing appropriate HEP, to be updated PRN    Time 4   Period Weeks   Status New           PT Long Term Goals - 11/20/14 0844    PT LONG TERM GOAL #1   Title Patient to demonstrate strength of 5/5 in all tested muscle groups to reduce pain and improve functional mechanics and posture    Time 8   Period Weeks   Status On-going   PT LONG TERM GOAL #2   Title Patient to demonstrate only mild impairment in muscle stiffness and will be independent  in functional stretching techniques to perform at home    Time 8   Period Weeks   Status On-going   PT LONG  TERM GOAL #3   Title Patient to be able to ambulate unlimited distances over even and uneven surfaces without assistive device and pain no more than 2/10   Time 8   Period Weeks   Status On-going   PT LONG TERM GOAL #4   Title Patient to be able to ascend and descend full flight of stairs with no railings, no unsteadiness, good eccentric control, and reciprocal pattern, pain no more than 2/10   Time 8   Period Weeks   Status On-going   PT LONG TERM GOAL #5   Title Patient to be performing light-moderate intensity exercise for at least 30 minutes, at least 3 times per week in order to assist in maintaining functional gains and improving overall health habits    Time 8   Period Weeks   Status On-going               Plan - 12/05/14 7209    Clinical Impression Statement Continued session focus with functional strengthening and stretches.  Added stretches to address the scoilosis with reports of relief following stretches and began strengthening exercies to assist with spinal curvature.  Progress balance activites with vector stance on airex and added theraband with sidestepping for glut med strengthening.  End of session pt reports pain reduced to 3/10.     PT Next Visit Plan continue to progress functional strengthening, stretching, and balance training.  Continue with Rt UE rows and Lt punches with theraband, Rt side bend with Lt UE overhead (within precautions)        Problem List Patient Active Problem List   Diagnosis Date Noted  . 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 Scoliosis of lumbar spine 06/21/2014  . Back pain 07/30/2013  . Encounter for screening colonoscopy 10/26/2011  . RLQ discomfort 10/26/2011  . Reflux 10/26/2011  . Pain in joint, shoulder region 08/12/2010  . Complete rupture of rotator cuff 08/12/2010  . Muscle weakness (generalized)  08/12/2010   Ihor Austin, LPTA; CBIS [phone]  Aldona Lento 12/05/2014, 10:20 AM  [place], [name], [address] Phone: [phone]   Fax:  [phone]  Name: Stephen Rowe MRN: [id number] Date of Birth: October 25, 1940

## 2014-12-09 ENCOUNTER — Ambulatory Visit (HOSPITAL_COMMUNITY): Payer: Medicare Other | Admitting: Physical Therapy

## 2014-12-09 DIAGNOSIS — M5441 Lumbago with sciatica, right side: Secondary | ICD-10-CM | POA: Diagnosis not present

## 2014-12-09 DIAGNOSIS — M5442 Lumbago with sciatica, left side: Secondary | ICD-10-CM

## 2014-12-09 DIAGNOSIS — M6281 Muscle weakness (generalized): Secondary | ICD-10-CM | POA: Diagnosis not present

## 2014-12-09 DIAGNOSIS — M4120 Other idiopathic scoliosis, site unspecified: Secondary | ICD-10-CM | POA: Diagnosis not present

## 2014-12-09 DIAGNOSIS — R262 Difficulty in walking, not elsewhere classified: Secondary | ICD-10-CM | POA: Diagnosis not present

## 2014-12-09 DIAGNOSIS — R293 Abnormal posture: Secondary | ICD-10-CM

## 2014-12-09 DIAGNOSIS — R6889 Other general symptoms and signs: Secondary | ICD-10-CM

## 2014-12-09 DIAGNOSIS — R2681 Unsteadiness on feet: Secondary | ICD-10-CM | POA: Diagnosis not present

## 2014-12-09 NOTE — Therapy (Signed)
[place], [name], [address] Phone: [phone]   Fax:  [phone]  Physical Therapy Treatment  Patient Details  Name: Stephen Rowe MRN: [id number] Date of Birth: [birth year] Referring Provider: Dr Kary Kos  Encounter Date: 12/09/2014      PT End of Session - 12/09/14 0839    Visit Number 7   Number of Visits 16   Date for PT Re-Evaluation 12/16/14   Authorization Type [employer] Medicare    Authorization Time Period 11/18/14 to 01/18/15   Authorization - Visit Number 7   Authorization - Number of Visits 10   PT Start Time 0801   PT Stop Time 0160   PT Time Calculation (min) 42 min   Activity Tolerance Patient tolerated treatment well      Past Medical History  Diagnosis Date  . Arthritis   . Prostatitis   . Hyperlipidemia   . History of kidney stones   . GERD (gastroesophageal reflux disease)   . PONV (postoperative nausea and vomiting)   . Bulging disc     at L4 and L5  . Prostate nodule   . Skin cancer of face   . CAD (coronary artery disease)     3 blockages, unchanged in past 15 years, s/p cardiac cath X 2 but no stents    Past Surgical History  Procedure Laterality Date  . Kidney stone surgery    . Rotator cuff repair      right  . Cholecystectomy    . Knee surgery      right  . Hand surgery      right  . Skin cancer excision      left ear  . Colonoscopy  11/18/2011    Procedure: COLONOSCOPY;  Surgeon: Daneil Dolin, MD;  [facility];  Service: Endoscopy;  Laterality: N/A;  8:45  . Knee surgery Right   . Joint replacement    . Cardiac catheterization      X 2; last 01/17/2003 ([facility]): 50% pLAD, 20% pOM1, 15% ostial LM. NL LVF. REC: Medical Tx (Dr. Elonda Husky)  . Anterior lat lumbar fusion N/A 06/21/2014    Procedure: ANTERIOR LATERAL LUMBAR FUSION Lumbar two-three, Lumbar three-four ;  Surgeon: Kary Kos, MD;  Location: [place] [facility];  Service: Neurosurgery;  Laterality: N/A;   . Application of intraoperative ct scan N/A 06/24/2014    Procedure: APPLICATION OF INTRAOPERATIVE CT SCAN;  Surgeon: Kary Kos, MD;  Location: [place] [facility];  Service: Neurosurgery;  Laterality: N/A;    There were no vitals filed for this visit.  Visit Diagnosis:  Idiopathic scoliosis  Bilateral low back pain with sciatica, sciatica laterality unspecified  Poor posture  Unsteadiness  Muscle weakness  Decreased functional activity tolerance  Difficulty walking      Subjective Assessment - 12/09/14 0806    Subjective Pt states that he walked a lot over the weekend and it helped.  States that his legs are stronger now.     Pain Score 4    Pain Location Back   Pain Orientation Lower                         [facility] Adult PT Treatment/Exercise - 12/09/14 0001    Exercises   Exercises Lumbar   Lumbar Exercises: Stretches   Single Knee to Chest Stretch 3 reps;30 seconds   Active Hamstring Stretch Both;3 reps;30 seconds   Active Hamstring Stretch  Limitations supine    Lumbar Exercises: Standing   Scapular Retraction Strengthening;Right;10 reps;Theraband   Theraband Level (Scapular Retraction) Level 3 (Green)   Other Standing Lumbar Exercises Protraction t-band on Lt    Knee/Hip Exercises: Stretches   Press photographer Both;3 reps;30 seconds   Knee/Hip Exercises: Seated   Other Seated Knee/Hip Exercises cervical and scapular retraction x 10 @   Knee/Hip Exercises: Supine   Other Supine Knee/Hip Exercises decompression exercises 1-5.    Knee/Hip Exercises: Prone   Prone Knee Hang Limitations glut sets, heel squeeze; opposite arm/leg raise x 10 each                 PT Education - 12/09/14 0841    Education provided Yes   Education Details new exercises as well as supine decompression exercises    Person(s) Educated Patient   Methods Explanation;Handout   Comprehension Verbalized understanding          PT Short Term Goals - 11/20/14 0843    PT  SHORT TERM GOAL #1   Title Patient will be able to verbally state the importance of good posture and will be independent in maintaining correct posture throughout all functional situation with no cues approximately 80% of the time    Time 4   Period Weeks   Status On-going   PT SHORT TERM GOAL #2   Title Patient to demonstrate improved functional mechanics and technique during rolling and sit to stand, as evidenced by an ability to perform these transfers with minimal difficulty and no cues for sequencing/appropriate technique    Time 4   Period Weeks   Status On-going   PT SHORT TERM GOAL #3   Title Patient to be able to perform TUG in 8 seconds with no assistive device and no unsteadiness    Time 4   Period Weeks   Status On-going   PT SHORT TERM GOAL #4   Title Patient will be independent in correctly and consistently performing appropriate HEP, to be updated PRN    Time 4   Period Weeks   Status New           PT Long Term Goals - 11/20/14 0844    PT LONG TERM GOAL #1   Title Patient to demonstrate strength of 5/5 in all tested muscle groups to reduce pain and improve functional mechanics and posture    Time 8   Period Weeks   Status On-going   PT LONG TERM GOAL #2   Title Patient to demonstrate only mild impairment in muscle stiffness and will be independent in functional stretching techniques to perform at home    Time 8   Period Weeks   Status On-going   PT LONG TERM GOAL #3   Title Patient to be able to ambulate unlimited distances over even and uneven surfaces without assistive device and pain no more than 2/10   Time 8   Period Weeks   Status On-going   PT LONG TERM GOAL #4   Title Patient to be able to ascend and descend full flight of stairs with no railings, no unsteadiness, good eccentric control, and reciprocal pattern, pain no more than 2/10   Time 8   Period Weeks   Status On-going   PT LONG TERM GOAL #5   Title Patient to be performing light-moderate  intensity exercise for at least 30 minutes, at least 3 times per week in order to assist in maintaining functional gains and improving overall health  habits    Time 8   Period Weeks   Status On-going               Plan - 12/09/14 0840    Clinical Impression Statement Pt instructed in new exercises with minimal verbal and manual cuing needed for proper technique.  Given handout of all new exercises.  Pt pain decreased 2 levels following treatment.    PT Next Visit Plan Begin t-band supine postural decompression exercises.         Problem List Patient Active Problem List   Diagnosis Date Noted  . 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 Scoliosis of lumbar spine 06/21/2014  . Back pain 07/30/2013  . Encounter for screening colonoscopy 10/26/2011  . RLQ discomfort 10/26/2011  . Reflux 10/26/2011  . Pain in joint, shoulder region 08/12/2010  . Complete rupture of rotator cuff 08/12/2010  . Muscle weakness (generalized) 08/12/2010   Rayetta Humphrey, PT CLT [phone] 12/09/2014, 8:44 AM  [place] [address] [place], [name], [address] Phone: [phone]   Fax:  [phone]  Name: Stephen Rowe MRN: [id number] Date of Birth: 1940-11-25

## 2014-12-09 NOTE — Patient Instructions (Signed)
Flexibility: Neck Retraction    Pull head straight back, keeping eyes and jaw level. Repeat __10__ times per set. Do _1___ sets per session. Do __3__ sessions per day.  http://orth.exer.us/344   Copyright  VHI. All rights reserved.  Scapular Retraction (Standing)    With arms at sides, pinch shoulder blades together. Repeat __10__ times per set. Do _1___ sets per session. Do __3_ sessions per day.  http://orth.exer.us/944   Copyright  VHI. All rights reserved.  Gluteal Sets    Tighten buttocks while pressing pelvis to floor. Hold __5__ seconds. Repeat __10__ times per set. Do 1____ sets per session. Do ___2_ sessions per day.  http://orth.exer.us/104   Copyright  VHI. All rights reserved.  Heel Squeeze (Prone)    Abdomen supported, bend knees and gently squeeze heels together. Hold __3__ seconds. Repeat __10__ times per set. Do __1__ sets per session. Do _2___ sessions per day.  http://orth.exer.us/1080   Copyright  VHI. All rights reserved.  Opposite Arm / Leg Lift (Prone)    Abdomen and head supported, left knee locked, raise leg and opposite arm __2__ inches from floor. Repeat _10___ times per set. Do 1____ sets per session. Do __2__ sessions per day.  http://orth.exer.us/1114   Copyright  VHI. All rights reserved.

## 2014-12-12 ENCOUNTER — Ambulatory Visit (HOSPITAL_COMMUNITY): Payer: Medicare Other | Admitting: Physical Therapy

## 2014-12-12 DIAGNOSIS — R262 Difficulty in walking, not elsewhere classified: Secondary | ICD-10-CM | POA: Diagnosis not present

## 2014-12-12 DIAGNOSIS — R293 Abnormal posture: Secondary | ICD-10-CM

## 2014-12-12 DIAGNOSIS — M5442 Lumbago with sciatica, left side: Secondary | ICD-10-CM

## 2014-12-12 DIAGNOSIS — M4120 Other idiopathic scoliosis, site unspecified: Secondary | ICD-10-CM | POA: Diagnosis not present

## 2014-12-12 DIAGNOSIS — R6889 Other general symptoms and signs: Secondary | ICD-10-CM | POA: Diagnosis not present

## 2014-12-12 DIAGNOSIS — M6281 Muscle weakness (generalized): Secondary | ICD-10-CM | POA: Diagnosis not present

## 2014-12-12 DIAGNOSIS — M5441 Lumbago with sciatica, right side: Secondary | ICD-10-CM | POA: Diagnosis not present

## 2014-12-12 DIAGNOSIS — R2681 Unsteadiness on feet: Secondary | ICD-10-CM | POA: Diagnosis not present

## 2014-12-12 NOTE — Therapy (Signed)
[place], [name], [address] Phone: [phone]   Fax:  [phone]  Physical Therapy Treatment (Discharge)  Patient Details  Name: Stephen Rowe MRN: [id number] Date of Birth: 1940-04-13 Referring Provider: Dr Kary Kos  Encounter Date: 12/12/2014      PT End of Session - 12/12/14 0852    Visit Number 8   Number of Visits 8   Date for PT Re-Evaluation 12/16/14   Authorization Type [employer]    Authorization Time Period 11/18/14 to 01/18/15   Authorization - Visit Number 8   Authorization - Number of Visits 10   PT Start Time 0802   PT Stop Time 0846   PT Time Calculation (min) 44 min   Activity Tolerance Patient tolerated treatment well   Behavior During Therapy [facility] for tasks assessed/performed      Past Medical History  Diagnosis Date  . Arthritis   . Prostatitis   . Hyperlipidemia   . History of kidney stones   . GERD (gastroesophageal reflux disease)   . PONV (postoperative nausea and vomiting)   . Bulging disc     at L4 and L5  . Prostate nodule   . Skin cancer of face   . CAD (coronary artery disease)     3 blockages, unchanged in past 15 years, s/p cardiac cath X 2 but no stents    Past Surgical History  Procedure Laterality Date  . Kidney stone surgery    . Rotator cuff repair      right  . Cholecystectomy    . Knee surgery      right  . Hand surgery      right  . Skin cancer excision      left ear  . Colonoscopy  11/18/2011    Procedure: COLONOSCOPY;  Surgeon: Daneil Dolin, MD;  Location: [facility];  Service: Endoscopy;  Laterality: N/A;  8:45  . Knee surgery Right   . Joint replacement    . Cardiac catheterization      X 2; last 01/17/2003 ([facility]): 50% pLAD, 20% pOM1, 15% ostial LM. NL LVF. REC: [facility] (Dr. Elonda Husky)  . Anterior lat lumbar fusion N/A 06/21/2014    Procedure: ANTERIOR LATERAL LUMBAR FUSION Lumbar two-three, Lumbar three-four ;  Surgeon: Kary Kos,  MD;  Location: [place] [facility];  Service: Neurosurgery;  Laterality: N/A;  . Application of intraoperative ct scan N/A 06/24/2014    Procedure: APPLICATION OF INTRAOPERATIVE CT SCAN;  Surgeon: Kary Kos, MD;  Location: [place] [facility] ORS;  Service: Neurosurgery;  Laterality: N/A;    There were no vitals filed for this visit.  Visit Diagnosis:  Idiopathic scoliosis  Bilateral low back pain with sciatica, sciatica laterality unspecified  Poor posture  Unsteadiness  Muscle weakness  Decreased functional activity tolerance  Difficulty walking      Subjective Assessment - 12/12/14 0804    Subjective Patient reports that he felt pretty good after last session especially after postural exercises, not a lot of pain this morning but he is feeling stiff    Pertinent History Patient reports that his pain has been chronic and about 3 years ago went to neurosurgeon, who found that vertebrae were pinching nerves. He was having problems with his leg going to sleep and a lot of pain in his leg and hip. Tried shots first, which did not help; also received some exercises from a therapist before surgery but found that this was just  aggravating his back; had surgeries to correct his back in May/June of 2016 during which he went into heart failure and had to go into ICU/life support at one point. He was in  the hospital 12 days overall.    How long can you sit comfortably? 11/10- no limits    How long can you stand comfortably? 11/10-  no limits    How long can you walk comfortably? 11/10- 30 minutes   Patient Stated Goals build leg strength, be able to move better and walk more without as much discomfort    Currently in Pain? Yes   Pain Score 3             [facility] PT Assessment - 12/12/14 0001    Observation/Other Assessments   Focus on Therapeutic Outcomes (FOTO)  60% limited    AROM   Right Hip Internal Rotation  22   Left Hip Internal Rotation  28   Strength   Right Hip Flexion 4/5   Right Hip  ABduction 4+/5   Left Hip Flexion 4/5   Left Hip ABduction 4-/5   Right Knee Flexion 4+/5   Right Knee Extension 4+/5   Left Knee Flexion 4/5   Left Knee Extension 4/5   Right Ankle Dorsiflexion 5/5   Left Ankle Dorsiflexion 5/5   Flexibility   Hamstrings moderate tightness   Piriformis mild tightness    6 minute walk test results    Endurance additional comments 6MWT 1561ft, 1.93m/s    High Level Balance   High Level Balance Comments TUG 6.6, 6.3, 6.5                      [facility] Adult PT Treatment/Exercise - 12/12/14 0001    Knee/Hip Exercises: Stretches   Active Hamstring Stretch Both;3 reps;30 seconds   Active Hamstring Stretch Limitations 12 inch box    Hip Flexor Stretch Both;2 reps;30 seconds   Hip Flexor Stretch Limitations 12 inch box    Gastroc Stretch Both;3 reps;30 seconds   Gastroc Stretch Limitations slant board                PT Education - 12/12/14 [phone]    Education provided Yes   Education Details DC today, encouraged to keep up with HEP and remain active    Person(s) Educated Patient   Methods Explanation   Comprehension Verbalized understanding          PT Short Term Goals - 12/12/14 [phone]    PT SHORT TERM GOAL #1   Title Patient will be able to verbally state the importance of good posture and will be independent in maintaining correct posture throughout all functional situation with no cues approximately 80% of the time    Baseline 11/10- vast improvements but still working on this, is able to verbalize importance of good posture though    Time 4   Period Weeks   Status Partially Met   PT SHORT TERM GOAL #2   Title Patient to demonstrate improved functional mechanics and technique during rolling and sit to stand, as evidenced by an ability to perform these transfers with minimal difficulty and no cues for sequencing/appropriate technique    Baseline 11/10- much better with movement but still having some pain    Time 4   Period  Weeks   Status Achieved   PT SHORT TERM GOAL #3   Title Patient to be able to perform TUG in 8 seconds with no assistive device and  no unsteadiness    Time 4   Period Weeks   Status Achieved   PT SHORT TERM GOAL #4   Title Patient will be independent in correctly and consistently performing appropriate HEP, to be updated PRN    Time 4   Period Weeks   Status Achieved           PT Long Term Goals - Jan 08, 2015 4917    PT LONG TERM GOAL #1   Title Patient to demonstrate strength of 5/5 in all tested muscle groups to reduce pain and improve functional mechanics and posture    Time 8   Period Weeks   Status On-going   PT LONG TERM GOAL #2   Title Patient to demonstrate only mild impairment in muscle stiffness and will be independent in functional stretching techniques to perform at home    Time 8   Period Weeks   Status On-going   PT LONG TERM GOAL #3   Title Patient to be able to ambulate unlimited distances over even and uneven surfaces without assistive device and pain no more than 2/10   Baseline 01/08/2023- able to walk around 30 minutes, with pain up to 4-5/10   Time 8   Period Weeks   Status On-going   PT LONG TERM GOAL #4   Title Patient to be able to ascend and descend full flight of stairs with no railings, no unsteadiness, good eccentric control, and reciprocal pattern, pain no more than 2/10   Period Weeks   Status Achieved   PT LONG TERM GOAL #5   Title Patient to be performing light-moderate intensity exercise for at least 30 minutes, at least 3 times per week in order to assist in maintaining functional gains and improving overall health habits    Baseline 01-08-23- walking at least 30 minutes a day, makes sure to stay active plus HEP    Time 8   Period Weeks   Status Achieved               Plan - Jan 08, 2015 9150    Clinical Impression Statement Discharge assessment performed today. Patient shows improvement all around, including functional strength, balance,  posture/postural awareness, and functional activity tolerance and functional mobility. He has been experiencing less pain recently and reports that the postural exercises have really worked to assist in reducing his pain. At this time he feels he has progressed very well and feels that he is ready for discharge at this time. He has already received  updates to his HEP  and is remaining very consistent with performing his HEP and walking in general. DC today.    Pt will benefit from skilled therapeutic intervention in order to improve on the following deficits Abnormal gait;Decreased endurance;Hypomobility;Decreased activity tolerance;Decreased strength;Pain;Decreased balance;Difficulty walking;Improper body mechanics;Decreased coordination;Impaired flexibility;Postural dysfunction   Rehab Potential Good   PT Treatment/Interventions ADLs/Self Care Home Management;Gait training;Stair training;Functional mobility training;Therapeutic activities;Therapeutic exercise;Balance training;Neuromuscular re-education;Patient/family education;Manual techniques   PT Next Visit Plan DC today    PT Home Exercise Plan given    Consulted and Agree with Plan of Care Patient          G-Codes - January 08, 2015 0903    Functional Assessment Tool Used FOTO 60% limited however per skilled clinical judgement based on strength, gait, posture, balance, functional activity tolerance estimate limitation to be approximately 35%    Functional Limitation Mobility: Walking and moving around   Mobility: Walking and Moving Around Goal Status ([id number]) At least 20 percent but  less than 40 percent impaired, limited or restricted   Mobility: Walking and Moving Around Discharge Status [phone]) At least 20 percent but less than 40 percent impaired, limited or restricted      Problem List Patient Active Problem List   Diagnosis Date Noted  . 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 Scoliosis of lumbar spine 06/21/2014  . Back pain  07/30/2013  . Encounter for screening colonoscopy 10/26/2011  . RLQ discomfort 10/26/2011  . Reflux 10/26/2011  . Pain in joint, shoulder region 08/12/2010  . Complete rupture of rotator cuff 08/12/2010  . Muscle weakness (generalized) 08/12/2010    PHYSICAL THERAPY DISCHARGE SUMMARY  Visits from Start of Care: 8  Current functional level related to goals / functional outcomes: Continues to have some back pain, but has made significant improvements with strength, balance, and functional activity tolearnce; he reports that he is very satisfied with his progress right now.    Remaining deficits: Hip stiffness, back pain, muscle weakness, postural impairment    Education / Equipment: Encouraged to remain active  Plan: Patient agrees to discharge.  Patient goals were partially met. Patient is being discharged due to being pleased with the current functional level.  ?????       Deniece Ree PT, DPT [place] [address] [place], [name], [address] Phone: [phone]   Fax:  [phone]  Name: Stephen Rowe MRN: [id number] Date of Birth: 11/03/1940

## 2014-12-16 ENCOUNTER — Encounter (HOSPITAL_COMMUNITY): Payer: Self-pay | Admitting: Physical Therapy

## 2014-12-19 ENCOUNTER — Encounter (HOSPITAL_COMMUNITY): Payer: Self-pay | Admitting: Physical Therapy

## 2014-12-23 ENCOUNTER — Encounter (HOSPITAL_COMMUNITY): Payer: Self-pay | Admitting: Physical Therapy

## 2014-12-25 ENCOUNTER — Encounter (HOSPITAL_COMMUNITY): Payer: Self-pay | Admitting: Physical Therapy

## 2014-12-30 ENCOUNTER — Encounter (HOSPITAL_COMMUNITY): Payer: Self-pay | Admitting: Physical Therapy

## 2015-01-02 ENCOUNTER — Encounter (HOSPITAL_COMMUNITY): Payer: Self-pay | Admitting: Physical Therapy

## 2015-02-11 DIAGNOSIS — M5126 Other intervertebral disc displacement, lumbar region: Secondary | ICD-10-CM | POA: Diagnosis not present

## 2015-02-11 DIAGNOSIS — M4120 Other idiopathic scoliosis, site unspecified: Secondary | ICD-10-CM | POA: Diagnosis not present

## 2015-03-24 ENCOUNTER — Other Ambulatory Visit (HOSPITAL_COMMUNITY): Payer: Self-pay | Admitting: Neurosurgery

## 2015-03-24 DIAGNOSIS — M5126 Other intervertebral disc displacement, lumbar region: Secondary | ICD-10-CM

## 2015-03-27 ENCOUNTER — Other Ambulatory Visit (HOSPITAL_COMMUNITY): Payer: Self-pay | Admitting: Neurosurgery

## 2015-03-27 DIAGNOSIS — M5124 Other intervertebral disc displacement, thoracic region: Secondary | ICD-10-CM

## 2015-03-31 ENCOUNTER — Ambulatory Visit (HOSPITAL_COMMUNITY)
Admission: RE | Admit: 2015-03-31 | Discharge: 2015-03-31 | Disposition: A | Discharge status: Home / Self Care | Payer: Medicare Other | Source: Ambulatory Visit | Attending: Neurosurgery | Admitting: Neurosurgery

## 2015-03-31 DIAGNOSIS — M5124 Other intervertebral disc displacement, thoracic region: Secondary | ICD-10-CM | POA: Diagnosis not present

## 2015-03-31 DIAGNOSIS — M5126 Other intervertebral disc displacement, lumbar region: Secondary | ICD-10-CM | POA: Diagnosis not present

## 2015-03-31 DIAGNOSIS — M5136 Other intervertebral disc degeneration, lumbar region: Secondary | ICD-10-CM | POA: Diagnosis not present

## 2015-03-31 DIAGNOSIS — M4325 Fusion of spine, thoracolumbar region: Secondary | ICD-10-CM | POA: Insufficient documentation

## 2015-03-31 DIAGNOSIS — M5134 Other intervertebral disc degeneration, thoracic region: Secondary | ICD-10-CM | POA: Diagnosis not present

## 2015-04-15 DIAGNOSIS — M5126 Other intervertebral disc displacement, lumbar region: Secondary | ICD-10-CM | POA: Diagnosis not present

## 2015-04-15 DIAGNOSIS — M4316 Spondylolisthesis, lumbar region: Secondary | ICD-10-CM | POA: Diagnosis not present

## 2015-04-17 DIAGNOSIS — D485 Neoplasm of uncertain behavior of skin: Secondary | ICD-10-CM | POA: Diagnosis not present

## 2015-04-17 DIAGNOSIS — D1801 Hemangioma of skin and subcutaneous tissue: Secondary | ICD-10-CM | POA: Diagnosis not present

## 2015-04-17 DIAGNOSIS — L570 Actinic keratosis: Secondary | ICD-10-CM | POA: Diagnosis not present

## 2015-04-17 DIAGNOSIS — D225 Melanocytic nevi of trunk: Secondary | ICD-10-CM | POA: Diagnosis not present

## 2015-05-07 ENCOUNTER — Encounter (HOSPITAL_COMMUNITY): Payer: Self-pay

## 2015-05-13 DIAGNOSIS — Z1389 Encounter for screening for other disorder: Secondary | ICD-10-CM | POA: Diagnosis not present

## 2015-05-13 DIAGNOSIS — Z6830 Body mass index (BMI) 30.0-30.9, adult: Secondary | ICD-10-CM | POA: Diagnosis not present

## 2015-05-13 DIAGNOSIS — G894 Chronic pain syndrome: Secondary | ICD-10-CM | POA: Diagnosis not present

## 2015-05-13 DIAGNOSIS — E781 Pure hyperglyceridemia: Secondary | ICD-10-CM | POA: Diagnosis not present

## 2015-05-13 DIAGNOSIS — I10 Essential (primary) hypertension: Secondary | ICD-10-CM | POA: Diagnosis not present

## 2015-05-13 DIAGNOSIS — Z0001 Encounter for general adult medical examination with abnormal findings: Secondary | ICD-10-CM | POA: Diagnosis not present

## 2015-05-13 DIAGNOSIS — E782 Mixed hyperlipidemia: Secondary | ICD-10-CM | POA: Diagnosis not present

## 2015-05-13 DIAGNOSIS — K219 Gastro-esophageal reflux disease without esophagitis: Secondary | ICD-10-CM | POA: Diagnosis not present

## 2015-05-13 DIAGNOSIS — E6609 Other obesity due to excess calories: Secondary | ICD-10-CM | POA: Diagnosis not present

## 2015-06-17 IMAGING — CT CT L SPINE W/O CM
2 of 14 series · 9 of 35 positions shown, 10 images · non-contrast
Comparison: Thoracolumbar radiographs today.

CLINICAL DATA: Scoliosis. Increasing back pain for several years.
Left leg pain.

EXAM:
CT THORACIC AND LUMBAR SPINE WITHOUT CONTRAST
TECHNIQUE: Multidetector CT imaging of the thoracic and lumbar spine was
performed without contrast. Multiplanar CT image reconstructions
were also generated.

[Series 3: t spine bone · axial · 0.31mm/px · z∈[-292,+38]mm · 3 of 133 slices shown, 4 images]
[im 1/133  soft-tissue]
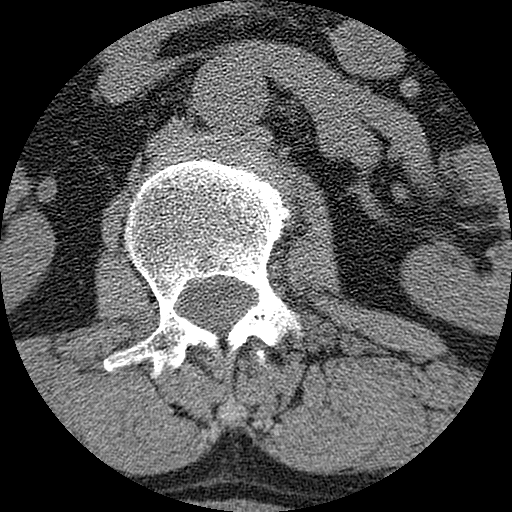
[im 1/133  bone]
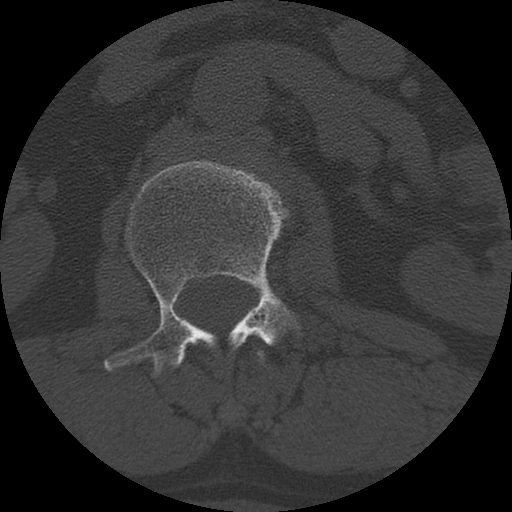
[im 67/133  bone]
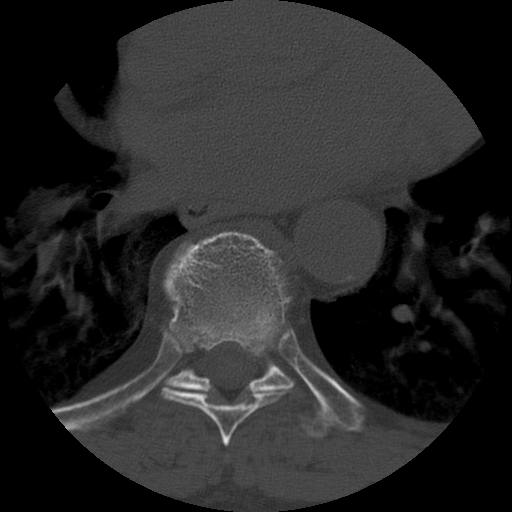
[im 133/133  bone]
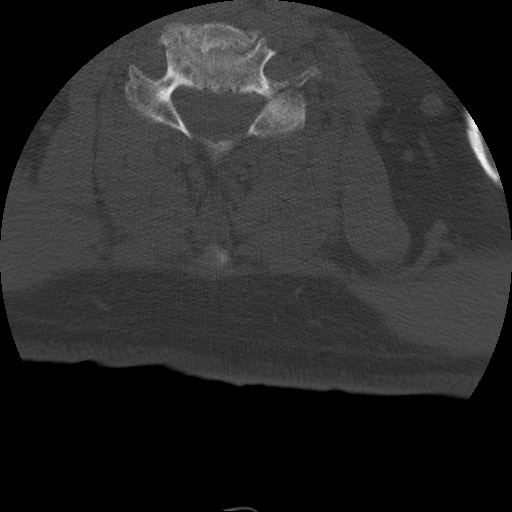

[Series 201: cor t spine #2 · coronal · 0.66mm/px · 6 of 53 slices shown]
[im 2/53  soft-tissue]
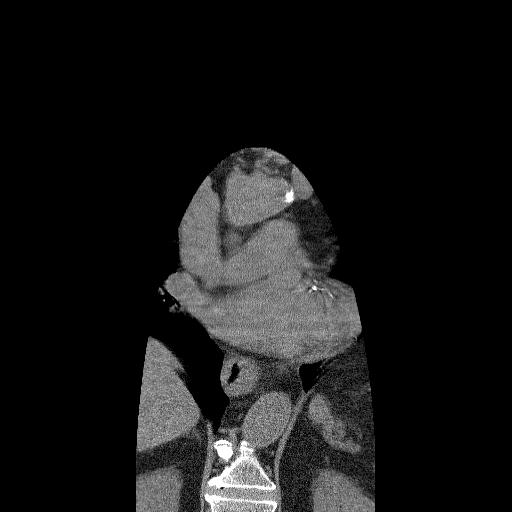
[im 18/53  bone]
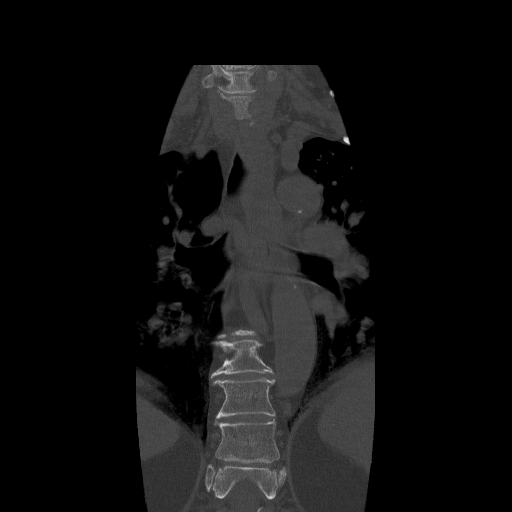
[im 22/53  bone]
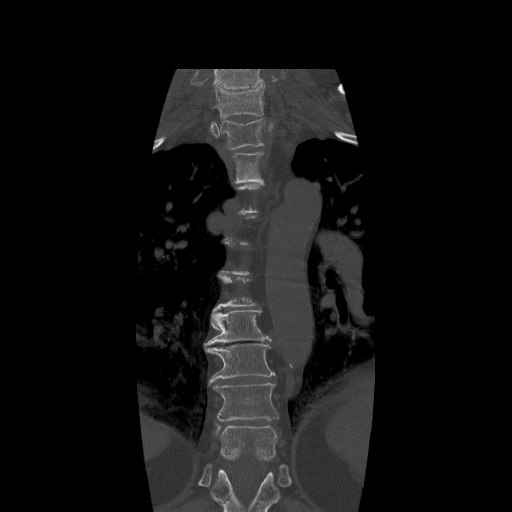
[im 27/53  bone]
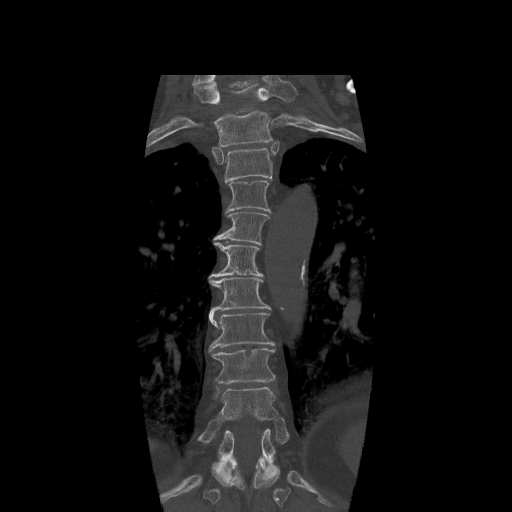
[im 31/53  bone]
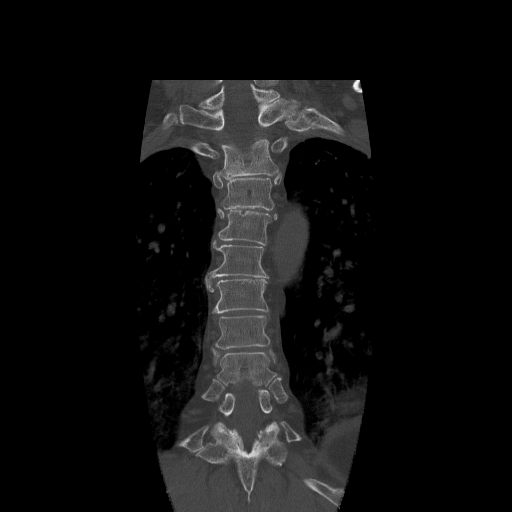
[im 35/53  bone]
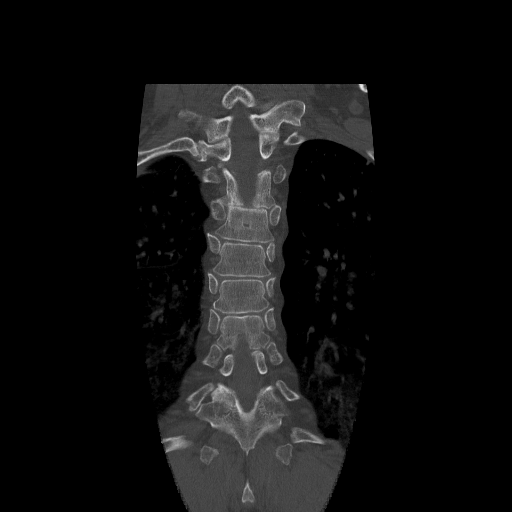

[9 of 35 positions shown; findings below may reference images not displayed]

Chest CT 10/16/2009.
CT abdomen and pelvis 07/31/2013. Lumbar spine MRI 07/23/2013.
FINDINGS: CT THORACIC SPINE FINDINGS

There is minimal left convex curvature of the upper thoracic spine.
Minimal retrolisthesis of T12 on L1 measures 3 mm. There is also
slight left lateral listhesis of T12 on L1. Minimal depression of
the T1 superior endplate/Schmorl's node deformity is unchanged from
prior chest CT. Several small Schmorl's nodes are also present more
inferiorly in the thoracic spine. Moderate right-sided marginal
endplate osteophytosis is present throughout the mid and lower
thoracic spine. Vacuum disc phenomenon is present at T7-8, T10-11,
and T12-L1.

Asymmetric right-sided disc space height loss and endplate spurring
result in moderate right neural foraminal stenosis at T2-3 and mild
right neural foraminal stenosis at T3-4. Shallow central disc
osteophyte complex are present at T2-3, T3-4, T4-5, and T5-6 without
evidence of gross spinal stenosis. A broad left paracentral disc
protrusion/disc osteophyte complex is present at T6-7 and slightly
flattens the left ventral thecal sac without gross spinal stenosis.
There is mild disc uncovering at T12-L1 secondary to the listhesis
with unchanged, mild left neural foraminal narrowing and no
significant spinal stenosis. Degenerative changes are grossly
similar in appearance to the prior chest CT. Coronary artery and
aortic calcification are partially visualized.

CT LUMBAR SPINE FINDINGS

Thoracolumbar dextroscoliosis is present as described on radiographs
earlier today. Trace retrolisthesis of L1 on L2, L2 on L3, and L3 on
L4 is unchanged from prior MRI. Bilateral L5 pars defects are again
seen with 12 mm anterolisthesis of L5 on S1, unchanged. Vacuum disc
phenomenon is present at T12-L1 and L5-S1. Mild-to-moderate disc
space narrowing is present throughout the lumbar spine, greatest at
L2-3 and L3-4. Associated degenerative endplate changes are present.
Aortoiliac atherosclerosis is noted.

L1-2: Mild disc bulging asymmetric to the left, endplate spurring,
mild facet hypertrophy result in mild left lateral recess and mild
left neural foraminal stenosis, similar to prior MRI. No spinal
stenosis.

L2-3: Mild disc bulging asymmetric to the left, endplate spurring,
and mild facet arthrosis result in mild left lateral recess and mild
left neural foraminal stenosis, similar to prior MRI. No spinal
stenosis.

L3-4: Mild disc bulging and endplate spurring asymmetric to the left
and mild facet arthrosis result in mild right lateral recess and
mild right neural foraminal stenosis, similar to prior MRI. No
spinal stenosis.

L4-5: Mild disc bulging and mild facet arthrosis result in
mild-to-moderate right and mild left neural foraminal stenosis
without spinal stenosis, similar to prior MRI.

L5-S1: Listhesis with disc uncovering results in moderate to severe
right and mild left neural foraminal stenosis and moderate right
lateral recess stenosis, similar to prior MRI. No spinal stenosis.
IMPRESSION: CT THORACIC SPINE IMPRESSION

Mild thoracic spondylosis without evidence of significant spinal
stenosis. Moderate right neural foraminal stenosis at T2-3.

CT LUMBAR SPINE IMPRESSION

1. Thoracolumbar dextroscoliosis.
2. Bilateral L5 pars defects with chronic grade 2 anterolisthesis of
L5 on S1.
3. Moderate multilevel disc degeneration without significant change
from prior MRI as above.

## 2015-07-03 DIAGNOSIS — D485 Neoplasm of uncertain behavior of skin: Secondary | ICD-10-CM | POA: Diagnosis not present

## 2015-07-03 DIAGNOSIS — D225 Melanocytic nevi of trunk: Secondary | ICD-10-CM | POA: Diagnosis not present

## 2015-07-21 DIAGNOSIS — E785 Hyperlipidemia, unspecified: Secondary | ICD-10-CM | POA: Diagnosis not present

## 2015-07-21 DIAGNOSIS — I2510 Atherosclerotic heart disease of native coronary artery without angina pectoris: Secondary | ICD-10-CM | POA: Diagnosis not present

## 2015-07-24 ENCOUNTER — Ambulatory Visit (INDEPENDENT_AMBULATORY_CARE_PROVIDER_SITE_OTHER): Payer: Medicare Other | Admitting: Otolaryngology

## 2015-07-24 DIAGNOSIS — H6121 Impacted cerumen, right ear: Secondary | ICD-10-CM | POA: Diagnosis not present

## 2015-07-24 DIAGNOSIS — H903 Sensorineural hearing loss, bilateral: Secondary | ICD-10-CM | POA: Diagnosis not present

## 2015-07-24 DIAGNOSIS — H6123 Impacted cerumen, bilateral: Secondary | ICD-10-CM | POA: Diagnosis not present

## 2016-01-19 ENCOUNTER — Ambulatory Visit (INDEPENDENT_AMBULATORY_CARE_PROVIDER_SITE_OTHER): Payer: Medicare Other | Admitting: Otolaryngology

## 2016-01-19 DIAGNOSIS — H903 Sensorineural hearing loss, bilateral: Secondary | ICD-10-CM

## 2016-01-19 DIAGNOSIS — H6123 Impacted cerumen, bilateral: Secondary | ICD-10-CM

## 2016-04-21 DIAGNOSIS — D1801 Hemangioma of skin and subcutaneous tissue: Secondary | ICD-10-CM | POA: Diagnosis not present

## 2016-04-21 DIAGNOSIS — L570 Actinic keratosis: Secondary | ICD-10-CM | POA: Diagnosis not present

## 2016-04-21 DIAGNOSIS — Z872 Personal history of diseases of the skin and subcutaneous tissue: Secondary | ICD-10-CM | POA: Diagnosis not present

## 2016-04-21 DIAGNOSIS — L821 Other seborrheic keratosis: Secondary | ICD-10-CM | POA: Diagnosis not present

## 2016-04-21 DIAGNOSIS — L814 Other melanin hyperpigmentation: Secondary | ICD-10-CM | POA: Diagnosis not present

## 2016-05-14 ENCOUNTER — Other Ambulatory Visit: Payer: Self-pay | Admitting: Neurosurgery

## 2016-05-14 DIAGNOSIS — M5137 Other intervertebral disc degeneration, lumbosacral region: Secondary | ICD-10-CM | POA: Diagnosis not present

## 2016-05-14 DIAGNOSIS — M4120 Other idiopathic scoliosis, site unspecified: Secondary | ICD-10-CM | POA: Diagnosis not present

## 2016-05-14 DIAGNOSIS — M4316 Spondylolisthesis, lumbar region: Secondary | ICD-10-CM | POA: Diagnosis not present

## 2016-05-24 ENCOUNTER — Other Ambulatory Visit: Payer: Self-pay

## 2016-05-24 ENCOUNTER — Ambulatory Visit
Admission: RE | Admit: 2016-05-24 | Discharge: 2016-05-24 | Disposition: A | Discharge status: Home / Self Care | Payer: Medicare Other | Source: Ambulatory Visit | Attending: Neurosurgery | Admitting: Neurosurgery

## 2016-05-24 DIAGNOSIS — M4120 Other idiopathic scoliosis, site unspecified: Secondary | ICD-10-CM | POA: Diagnosis not present

## 2016-05-24 DIAGNOSIS — M5137 Other intervertebral disc degeneration, lumbosacral region: Secondary | ICD-10-CM

## 2016-05-24 DIAGNOSIS — R296 Repeated falls: Secondary | ICD-10-CM | POA: Diagnosis not present

## 2016-05-28 DIAGNOSIS — M545 Low back pain: Secondary | ICD-10-CM | POA: Diagnosis not present

## 2016-05-28 DIAGNOSIS — M4120 Other idiopathic scoliosis, site unspecified: Secondary | ICD-10-CM | POA: Diagnosis not present

## 2016-05-31 DIAGNOSIS — M4120 Other idiopathic scoliosis, site unspecified: Secondary | ICD-10-CM | POA: Diagnosis not present

## 2016-05-31 DIAGNOSIS — M545 Low back pain: Secondary | ICD-10-CM | POA: Diagnosis not present

## 2016-06-02 DIAGNOSIS — M545 Low back pain: Secondary | ICD-10-CM | POA: Diagnosis not present

## 2016-06-02 DIAGNOSIS — M4120 Other idiopathic scoliosis, site unspecified: Secondary | ICD-10-CM | POA: Diagnosis not present

## 2016-06-08 DIAGNOSIS — H52223 Regular astigmatism, bilateral: Secondary | ICD-10-CM | POA: Diagnosis not present

## 2016-06-08 DIAGNOSIS — H2513 Age-related nuclear cataract, bilateral: Secondary | ICD-10-CM | POA: Diagnosis not present

## 2016-06-08 DIAGNOSIS — M4120 Other idiopathic scoliosis, site unspecified: Secondary | ICD-10-CM | POA: Diagnosis not present

## 2016-06-08 DIAGNOSIS — M545 Low back pain: Secondary | ICD-10-CM | POA: Diagnosis not present

## 2016-06-11 DIAGNOSIS — M545 Low back pain: Secondary | ICD-10-CM | POA: Diagnosis not present

## 2016-06-11 DIAGNOSIS — M4120 Other idiopathic scoliosis, site unspecified: Secondary | ICD-10-CM | POA: Diagnosis not present

## 2016-06-14 DIAGNOSIS — M4120 Other idiopathic scoliosis, site unspecified: Secondary | ICD-10-CM | POA: Diagnosis not present

## 2016-06-14 DIAGNOSIS — M545 Low back pain: Secondary | ICD-10-CM | POA: Diagnosis not present

## 2016-06-17 DIAGNOSIS — M4120 Other idiopathic scoliosis, site unspecified: Secondary | ICD-10-CM | POA: Diagnosis not present

## 2016-06-17 DIAGNOSIS — M545 Low back pain: Secondary | ICD-10-CM | POA: Diagnosis not present

## 2016-06-22 DIAGNOSIS — M4120 Other idiopathic scoliosis, site unspecified: Secondary | ICD-10-CM | POA: Diagnosis not present

## 2016-06-22 DIAGNOSIS — M545 Low back pain: Secondary | ICD-10-CM | POA: Diagnosis not present

## 2016-06-24 DIAGNOSIS — M545 Low back pain: Secondary | ICD-10-CM | POA: Diagnosis not present

## 2016-06-24 DIAGNOSIS — M4120 Other idiopathic scoliosis, site unspecified: Secondary | ICD-10-CM | POA: Diagnosis not present

## 2016-06-29 DIAGNOSIS — M4120 Other idiopathic scoliosis, site unspecified: Secondary | ICD-10-CM | POA: Diagnosis not present

## 2016-06-29 DIAGNOSIS — M545 Low back pain: Secondary | ICD-10-CM | POA: Diagnosis not present

## 2016-07-02 DIAGNOSIS — M545 Low back pain: Secondary | ICD-10-CM | POA: Diagnosis not present

## 2016-07-02 DIAGNOSIS — M4120 Other idiopathic scoliosis, site unspecified: Secondary | ICD-10-CM | POA: Diagnosis not present

## 2016-07-05 DIAGNOSIS — M5440 Lumbago with sciatica, unspecified side: Secondary | ICD-10-CM | POA: Diagnosis not present

## 2016-07-29 DIAGNOSIS — I2510 Atherosclerotic heart disease of native coronary artery without angina pectoris: Secondary | ICD-10-CM | POA: Diagnosis not present

## 2016-07-29 DIAGNOSIS — E785 Hyperlipidemia, unspecified: Secondary | ICD-10-CM | POA: Diagnosis not present

## 2016-10-05 DIAGNOSIS — M4120 Other idiopathic scoliosis, site unspecified: Secondary | ICD-10-CM | POA: Diagnosis not present

## 2016-10-05 DIAGNOSIS — M5124 Other intervertebral disc displacement, thoracic region: Secondary | ICD-10-CM | POA: Diagnosis not present

## 2016-10-07 ENCOUNTER — Other Ambulatory Visit: Payer: Self-pay | Admitting: Neurosurgery

## 2016-10-07 DIAGNOSIS — M5124 Other intervertebral disc displacement, thoracic region: Secondary | ICD-10-CM

## 2016-10-18 ENCOUNTER — Encounter: Payer: Self-pay | Admitting: Radiology

## 2016-10-18 ENCOUNTER — Ambulatory Visit
Admission: RE | Admit: 2016-10-18 | Discharge: 2016-10-18 | Disposition: A | Discharge status: Home / Self Care | Payer: Medicare Other | Source: Ambulatory Visit | Attending: Neurosurgery | Admitting: Neurosurgery

## 2016-10-18 DIAGNOSIS — M546 Pain in thoracic spine: Secondary | ICD-10-CM | POA: Diagnosis not present

## 2016-10-18 DIAGNOSIS — M5124 Other intervertebral disc displacement, thoracic region: Secondary | ICD-10-CM

## 2016-10-18 MED ORDER — TRIAMCINOLONE ACETONIDE 40 MG/ML IJ SUSP (RADIOLOGY)
60.0000 mg | Freq: Once | INTRAMUSCULAR | Status: AC
  Administered 2016-10-18: 60 mg via EPIDURAL

## 2016-10-18 MED ORDER — IOPAMIDOL (ISOVUE-M 300) INJECTION 61%
1.0000 mL | Freq: Once | INTRAMUSCULAR | Status: AC | PRN
  Administered 2016-10-18: 1 mL via EPIDURAL

## 2016-10-18 NOTE — Discharge Instructions (Signed)

## 2016-11-02 DIAGNOSIS — M5124 Other intervertebral disc displacement, thoracic region: Secondary | ICD-10-CM | POA: Diagnosis not present

## 2016-11-02 DIAGNOSIS — M48061 Spinal stenosis, lumbar region without neurogenic claudication: Secondary | ICD-10-CM | POA: Diagnosis not present

## 2016-12-08 DIAGNOSIS — M5124 Other intervertebral disc displacement, thoracic region: Secondary | ICD-10-CM | POA: Diagnosis not present

## 2016-12-08 DIAGNOSIS — Z1389 Encounter for screening for other disorder: Secondary | ICD-10-CM | POA: Diagnosis not present

## 2016-12-08 DIAGNOSIS — M48061 Spinal stenosis, lumbar region without neurogenic claudication: Secondary | ICD-10-CM | POA: Diagnosis not present

## 2016-12-08 DIAGNOSIS — Z0000 Encounter for general adult medical examination without abnormal findings: Secondary | ICD-10-CM | POA: Diagnosis not present

## 2016-12-08 DIAGNOSIS — R7301 Impaired fasting glucose: Secondary | ICD-10-CM | POA: Diagnosis not present

## 2016-12-09 DIAGNOSIS — R7301 Impaired fasting glucose: Secondary | ICD-10-CM | POA: Diagnosis not present

## 2016-12-09 DIAGNOSIS — Z1389 Encounter for screening for other disorder: Secondary | ICD-10-CM | POA: Diagnosis not present

## 2016-12-09 DIAGNOSIS — Z0000 Encounter for general adult medical examination without abnormal findings: Secondary | ICD-10-CM | POA: Diagnosis not present

## 2017-01-04 DIAGNOSIS — M5440 Lumbago with sciatica, unspecified side: Secondary | ICD-10-CM | POA: Diagnosis not present

## 2017-01-17 ENCOUNTER — Ambulatory Visit (INDEPENDENT_AMBULATORY_CARE_PROVIDER_SITE_OTHER): Payer: Medicare Other | Admitting: Otolaryngology

## 2017-01-17 DIAGNOSIS — H6123 Impacted cerumen, bilateral: Secondary | ICD-10-CM

## 2017-01-17 DIAGNOSIS — H903 Sensorineural hearing loss, bilateral: Secondary | ICD-10-CM | POA: Diagnosis not present

## 2017-04-01 DIAGNOSIS — Z1389 Encounter for screening for other disorder: Secondary | ICD-10-CM | POA: Diagnosis not present

## 2017-04-01 DIAGNOSIS — K219 Gastro-esophageal reflux disease without esophagitis: Secondary | ICD-10-CM | POA: Diagnosis not present

## 2017-04-01 DIAGNOSIS — E119 Type 2 diabetes mellitus without complications: Secondary | ICD-10-CM | POA: Diagnosis not present

## 2017-04-01 DIAGNOSIS — E748 Other specified disorders of carbohydrate metabolism: Secondary | ICD-10-CM | POA: Diagnosis not present

## 2017-04-25 DIAGNOSIS — L820 Inflamed seborrheic keratosis: Secondary | ICD-10-CM | POA: Diagnosis not present

## 2017-04-25 DIAGNOSIS — D225 Melanocytic nevi of trunk: Secondary | ICD-10-CM | POA: Diagnosis not present

## 2017-04-25 DIAGNOSIS — L821 Other seborrheic keratosis: Secondary | ICD-10-CM | POA: Diagnosis not present

## 2017-04-25 DIAGNOSIS — D485 Neoplasm of uncertain behavior of skin: Secondary | ICD-10-CM | POA: Diagnosis not present

## 2017-04-25 DIAGNOSIS — L570 Actinic keratosis: Secondary | ICD-10-CM | POA: Diagnosis not present

## 2017-04-25 DIAGNOSIS — Z85828 Personal history of other malignant neoplasm of skin: Secondary | ICD-10-CM | POA: Diagnosis not present

## 2017-07-04 DIAGNOSIS — D485 Neoplasm of uncertain behavior of skin: Secondary | ICD-10-CM | POA: Diagnosis not present

## 2017-07-04 DIAGNOSIS — C4401 Basal cell carcinoma of skin of lip: Secondary | ICD-10-CM | POA: Diagnosis not present

## 2017-09-20 DIAGNOSIS — C441191 Basal cell carcinoma of skin of left upper eyelid, including canthus: Secondary | ICD-10-CM | POA: Diagnosis not present

## 2017-11-01 DIAGNOSIS — M159 Polyosteoarthritis, unspecified: Secondary | ICD-10-CM | POA: Diagnosis not present

## 2017-11-01 DIAGNOSIS — R7309 Other abnormal glucose: Secondary | ICD-10-CM | POA: Diagnosis not present

## 2017-11-01 DIAGNOSIS — Z1389 Encounter for screening for other disorder: Secondary | ICD-10-CM | POA: Diagnosis not present

## 2017-11-01 DIAGNOSIS — M4310 Spondylolisthesis, site unspecified: Secondary | ICD-10-CM | POA: Diagnosis not present

## 2017-11-01 DIAGNOSIS — I2510 Atherosclerotic heart disease of native coronary artery without angina pectoris: Secondary | ICD-10-CM | POA: Diagnosis not present

## 2017-11-01 DIAGNOSIS — I10 Essential (primary) hypertension: Secondary | ICD-10-CM | POA: Diagnosis not present

## 2017-11-01 DIAGNOSIS — E782 Mixed hyperlipidemia: Secondary | ICD-10-CM | POA: Diagnosis not present

## 2017-11-01 DIAGNOSIS — Z0001 Encounter for general adult medical examination with abnormal findings: Secondary | ICD-10-CM | POA: Diagnosis not present

## 2018-01-06 DIAGNOSIS — E789 Disorder of lipoprotein metabolism, unspecified: Secondary | ICD-10-CM | POA: Insufficient documentation

## 2018-01-06 DIAGNOSIS — I2510 Atherosclerotic heart disease of native coronary artery without angina pectoris: Secondary | ICD-10-CM | POA: Diagnosis not present

## 2018-01-09 DIAGNOSIS — H524 Presbyopia: Secondary | ICD-10-CM | POA: Diagnosis not present

## 2018-01-09 DIAGNOSIS — H52223 Regular astigmatism, bilateral: Secondary | ICD-10-CM | POA: Diagnosis not present

## 2018-01-09 DIAGNOSIS — H5203 Hypermetropia, bilateral: Secondary | ICD-10-CM | POA: Diagnosis not present

## 2018-01-09 DIAGNOSIS — E119 Type 2 diabetes mellitus without complications: Secondary | ICD-10-CM | POA: Diagnosis not present

## 2018-01-10 DIAGNOSIS — D1801 Hemangioma of skin and subcutaneous tissue: Secondary | ICD-10-CM | POA: Diagnosis not present

## 2018-01-10 DIAGNOSIS — Z85828 Personal history of other malignant neoplasm of skin: Secondary | ICD-10-CM | POA: Diagnosis not present

## 2018-01-10 DIAGNOSIS — D225 Melanocytic nevi of trunk: Secondary | ICD-10-CM | POA: Diagnosis not present

## 2018-01-10 DIAGNOSIS — L821 Other seborrheic keratosis: Secondary | ICD-10-CM | POA: Diagnosis not present

## 2018-01-16 ENCOUNTER — Ambulatory Visit (INDEPENDENT_AMBULATORY_CARE_PROVIDER_SITE_OTHER): Payer: Medicare Other | Admitting: Otolaryngology

## 2018-01-16 DIAGNOSIS — H6123 Impacted cerumen, bilateral: Secondary | ICD-10-CM | POA: Diagnosis not present

## 2018-01-16 DIAGNOSIS — H903 Sensorineural hearing loss, bilateral: Secondary | ICD-10-CM

## 2018-06-02 DIAGNOSIS — E7849 Other hyperlipidemia: Secondary | ICD-10-CM | POA: Diagnosis not present

## 2018-06-02 DIAGNOSIS — Z0000 Encounter for general adult medical examination without abnormal findings: Secondary | ICD-10-CM | POA: Diagnosis not present

## 2018-06-02 DIAGNOSIS — Z1389 Encounter for screening for other disorder: Secondary | ICD-10-CM | POA: Diagnosis not present

## 2018-06-02 DIAGNOSIS — E119 Type 2 diabetes mellitus without complications: Secondary | ICD-10-CM | POA: Diagnosis not present

## 2018-06-21 DIAGNOSIS — I10 Essential (primary) hypertension: Secondary | ICD-10-CM | POA: Diagnosis not present

## 2018-06-21 DIAGNOSIS — E7849 Other hyperlipidemia: Secondary | ICD-10-CM | POA: Diagnosis not present

## 2018-06-21 DIAGNOSIS — K219 Gastro-esophageal reflux disease without esophagitis: Secondary | ICD-10-CM | POA: Diagnosis not present

## 2018-06-21 DIAGNOSIS — E119 Type 2 diabetes mellitus without complications: Secondary | ICD-10-CM | POA: Diagnosis not present

## 2019-01-02 ENCOUNTER — Other Ambulatory Visit: Payer: Self-pay

## 2019-01-02 DIAGNOSIS — Z20822 Contact with and (suspected) exposure to covid-19: Secondary | ICD-10-CM

## 2019-01-04 LAB — NOVEL CORONAVIRUS, NAA: SARS-CoV-2, NAA: NOT DETECTED

## 2019-01-17 ENCOUNTER — Other Ambulatory Visit: Payer: Self-pay

## 2019-01-17 ENCOUNTER — Ambulatory Visit: Payer: Medicare Other | Attending: Internal Medicine

## 2019-01-17 DIAGNOSIS — I10 Essential (primary) hypertension: Secondary | ICD-10-CM | POA: Diagnosis not present

## 2019-01-17 DIAGNOSIS — E119 Type 2 diabetes mellitus without complications: Secondary | ICD-10-CM | POA: Diagnosis not present

## 2019-01-17 DIAGNOSIS — M159 Polyosteoarthritis, unspecified: Secondary | ICD-10-CM | POA: Diagnosis not present

## 2019-01-17 DIAGNOSIS — Z20822 Contact with and (suspected) exposure to covid-19: Secondary | ICD-10-CM

## 2019-01-17 DIAGNOSIS — G894 Chronic pain syndrome: Secondary | ICD-10-CM | POA: Diagnosis not present

## 2019-01-17 DIAGNOSIS — Z1389 Encounter for screening for other disorder: Secondary | ICD-10-CM | POA: Diagnosis not present

## 2019-01-18 LAB — NOVEL CORONAVIRUS, NAA: SARS-CoV-2, NAA: NOT DETECTED

## 2019-01-19 ENCOUNTER — Telehealth: Payer: Self-pay | Admitting: General Practice

## 2019-01-19 DIAGNOSIS — E789 Disorder of lipoprotein metabolism, unspecified: Secondary | ICD-10-CM | POA: Diagnosis not present

## 2019-01-19 DIAGNOSIS — I2510 Atherosclerotic heart disease of native coronary artery without angina pectoris: Secondary | ICD-10-CM | POA: Diagnosis not present

## 2019-01-19 NOTE — Telephone Encounter (Signed)
Negative COVID results given. Patient results "NOT Detected." Caller expressed understanding. ° °

## 2019-06-05 DIAGNOSIS — I10 Essential (primary) hypertension: Secondary | ICD-10-CM | POA: Diagnosis not present

## 2019-06-05 DIAGNOSIS — I2510 Atherosclerotic heart disease of native coronary artery without angina pectoris: Secondary | ICD-10-CM | POA: Diagnosis not present

## 2019-06-05 DIAGNOSIS — E782 Mixed hyperlipidemia: Secondary | ICD-10-CM | POA: Diagnosis not present

## 2019-06-05 DIAGNOSIS — Z1389 Encounter for screening for other disorder: Secondary | ICD-10-CM | POA: Diagnosis not present

## 2019-06-05 DIAGNOSIS — M4310 Spondylolisthesis, site unspecified: Secondary | ICD-10-CM | POA: Diagnosis not present

## 2019-06-05 DIAGNOSIS — Z0000 Encounter for general adult medical examination without abnormal findings: Secondary | ICD-10-CM | POA: Diagnosis not present

## 2019-06-05 DIAGNOSIS — E039 Hypothyroidism, unspecified: Secondary | ICD-10-CM | POA: Diagnosis not present

## 2019-06-05 DIAGNOSIS — E119 Type 2 diabetes mellitus without complications: Secondary | ICD-10-CM | POA: Diagnosis not present

## 2019-09-18 ENCOUNTER — Other Ambulatory Visit: Payer: Self-pay

## 2019-09-18 ENCOUNTER — Emergency Department (HOSPITAL_COMMUNITY): Payer: Medicare Other

## 2019-09-18 ENCOUNTER — Encounter (HOSPITAL_COMMUNITY): Payer: Self-pay | Admitting: *Deleted

## 2019-09-18 ENCOUNTER — Emergency Department (HOSPITAL_COMMUNITY)
Admission: EM | Admit: 2019-09-18 | Discharge: 2019-09-18 | Disposition: A | Discharge status: Home / Self Care | Payer: Medicare Other | Attending: Emergency Medicine | Admitting: Emergency Medicine

## 2019-09-18 DIAGNOSIS — S022XXB Fracture of nasal bones, initial encounter for open fracture: Secondary | ICD-10-CM | POA: Diagnosis not present

## 2019-09-18 DIAGNOSIS — S0181XA Laceration without foreign body of other part of head, initial encounter: Secondary | ICD-10-CM | POA: Diagnosis not present

## 2019-09-18 DIAGNOSIS — Z87891 Personal history of nicotine dependence: Secondary | ICD-10-CM | POA: Diagnosis not present

## 2019-09-18 DIAGNOSIS — S0121XA Laceration without foreign body of nose, initial encounter: Secondary | ICD-10-CM | POA: Diagnosis not present

## 2019-09-18 DIAGNOSIS — Z85828 Personal history of other malignant neoplasm of skin: Secondary | ICD-10-CM | POA: Insufficient documentation

## 2019-09-18 DIAGNOSIS — S022XXA Fracture of nasal bones, initial encounter for closed fracture: Secondary | ICD-10-CM | POA: Diagnosis not present

## 2019-09-18 DIAGNOSIS — G9389 Other specified disorders of brain: Secondary | ICD-10-CM | POA: Diagnosis not present

## 2019-09-18 DIAGNOSIS — Y9389 Activity, other specified: Secondary | ICD-10-CM | POA: Diagnosis not present

## 2019-09-18 DIAGNOSIS — Y929 Unspecified place or not applicable: Secondary | ICD-10-CM | POA: Insufficient documentation

## 2019-09-18 DIAGNOSIS — W19XXXA Unspecified fall, initial encounter: Secondary | ICD-10-CM | POA: Insufficient documentation

## 2019-09-18 DIAGNOSIS — R220 Localized swelling, mass and lump, head: Secondary | ICD-10-CM | POA: Diagnosis not present

## 2019-09-18 DIAGNOSIS — Y999 Unspecified external cause status: Secondary | ICD-10-CM | POA: Insufficient documentation

## 2019-09-18 DIAGNOSIS — I2510 Atherosclerotic heart disease of native coronary artery without angina pectoris: Secondary | ICD-10-CM | POA: Diagnosis not present

## 2019-09-18 DIAGNOSIS — G319 Degenerative disease of nervous system, unspecified: Secondary | ICD-10-CM | POA: Diagnosis not present

## 2019-09-18 MED ORDER — LIDOCAINE HCL (PF) 1 % IJ SOLN
5.0000 mL | Freq: Once | INTRAMUSCULAR | Status: AC
  Administered 2019-09-18: 5 mL
  Filled 2019-09-18: qty 30

## 2019-09-18 MED ORDER — DOXYCYCLINE HYCLATE 100 MG PO CAPS: 100.0000 mg | ORAL_CAPSULE | Freq: Two times a day (BID) | ORAL | 0 refills | Status: AC

## 2019-09-18 MED ORDER — POVIDONE-IODINE 10 % EX SOLN
CUTANEOUS | Status: AC
  Filled 2019-09-18: qty 15

## 2019-09-18 MED ORDER — DOXYCYCLINE HYCLATE 100 MG PO TABS
100.0000 mg | ORAL_TABLET | Freq: Once | ORAL | Status: AC
  Administered 2019-09-18: 100 mg via ORAL
  Filled 2019-09-18: qty 1

## 2019-09-18 NOTE — ED Provider Notes (Signed)
Emergency Department Provider Note   I have reviewed the triage vital signs and the nursing notes.   HISTORY  Chief Complaint Laceration and Fall   HPI Stephen Rowe is a 79 y.o. male with PMH reviewed below presents to the ED with face laceration after injury at work. Patient states he was pushing a trash can when it fell forward and the lid came up and struck him in the face. He denies LOC. No neck pain. No confusion or vomiting. He notes laceration to the nose with bleeding. Patient does take 81 mg asa at home. No arm/leg pain. Patient ambulatory without difficulty. No radiation of symptoms or modifying factors. Epistaxis has resolved.    Past Medical History:  Diagnosis Date   Arthritis    Bulging disc    at L4 and L5   CAD (coronary artery disease)    3 blockages, unchanged in past 15 years, s/p cardiac cath X 2 but no stents   GERD (gastroesophageal reflux disease)    History of kidney stones    Hyperlipidemia    PONV (postoperative nausea and vomiting)    Prostate nodule    Prostatitis    Skin cancer of face     Patient Active Problem List   Diagnosis Date Noted   Spondylolisthesis at L5-S1 level 06/25/2014   Scoliosis of lumbar spine 06/21/2014   CAD (coronary artery disease) 11/30/2013   Kidney stone 11/30/2013   Reduced libido 11/30/2013   Esophageal reflux 11/30/2013   Hyperlipemia 11/30/2013   Nocturia 11/30/2013   Nodular prostate without urinary obstruction 11/30/2013   Back pain 07/30/2013   DDD (degenerative disc disease), lumbar 07/27/2013   Nephrolithiasis 07/27/2013   Encounter for screening colonoscopy 10/26/2011   RLQ discomfort 10/26/2011   Reflux 10/26/2011   Pain in joint, shoulder region 08/12/2010   Complete rupture of rotator cuff 08/12/2010   Muscle weakness (generalized) 08/12/2010    Past Surgical History:  Procedure Laterality Date   ANTERIOR LAT LUMBAR FUSION N/A 06/21/2014   Procedure: ANTERIOR  LATERAL LUMBAR FUSION Lumbar two-three, Lumbar three-four ;  Surgeon: Kary Kos, MD;  Location: [facility];  Service: Neurosurgery;  Laterality: N/A;   APPLICATION OF INTRAOPERATIVE CT SCAN N/A 06/24/2014   Procedure: APPLICATION OF INTRAOPERATIVE CT SCAN;  Surgeon: Kary Kos, MD;  Location: [facility];  Service: Neurosurgery;  Laterality: N/A;   CARDIAC CATHETERIZATION     X 2; last 01/17/2003 ([facility]): 50% pLAD, 20% pOM1, 15% ostial LM. NL LVF. REC: [facility] (Dr. Elonda Husky)   CHOLECYSTECTOMY     COLONOSCOPY  11/18/2011   Procedure: COLONOSCOPY;  Surgeon: Daneil Dolin, MD;  Location: [facility];  Service: Endoscopy;  Laterality: N/A;  8:45   HAND SURGERY     right   JOINT REPLACEMENT     KIDNEY STONE SURGERY     KNEE SURGERY     right   KNEE SURGERY Right    ROTATOR CUFF REPAIR     right   SKIN CANCER EXCISION     left ear    Allergies Patient has no known allergies.  Family History  Problem Relation Age of Onset   Colon cancer Neg Hx     Social History Social History   Tobacco Use   Smoking status: Former Smoker    Packs/day: 1.00    Years: 4.00    Pack years: 4.00    Types: Cigarettes   Smokeless tobacco: Never Used   Tobacco comment: quit  about 45 years ago  Substance Use Topics   Alcohol use: No   Drug use: No    Review of Systems  Constitutional: No fever/chills Eyes: No visual changes. ENT: No sore throat. Nose pain and epistaxis.  Cardiovascular: Denies chest pain. Respiratory: Denies shortness of breath. Gastrointestinal: No abdominal pain.  No nausea, no vomiting.  No diarrhea.  No constipation. Genitourinary: Negative for dysuria. Musculoskeletal: Negative for back pain. Skin: Nose laceration.  Neurological: Negative for focal weakness or numbness. Positive HA.   10-point ROS otherwise negative.  ____________________________________________   PHYSICAL EXAM:  VITAL SIGNS: ED Triage Vitals [09/18/19 1705]  Enc Vitals  Group     BP (!) 149/85     Pulse Rate 88     Resp 18     Temp 97.7 F (36.5 C)     Temp Source Oral     SpO2 95 %     Weight 200 lb (90.7 kg)     Height 5\' 10"  (1.778 m)   Constitutional: Alert and oriented. Well appearing and in no acute distress. Eyes: Conjunctivae are normal. PERRL. EOMI without evidence of entrapment.  Head: Atraumatic. Nose: No congestion/rhinnorhea. No active bleeding or septal hematoma. 4 cm laceration to the nasal bridge.  Mouth/Throat: Mucous membranes are moist.  Oropharynx non-erythematous. Neck: No stridor. No cervical spine tenderness to palpation. Cardiovascular: Normal rate, regular rhythm. Good peripheral circulation. Grossly normal heart sounds.   Respiratory: Normal respiratory effort.  No retractions. Lungs CTAB. Gastrointestinal: Soft and nontender. No distention.  Musculoskeletal: No lower extremity tenderness nor edema. No gross deformities of extremities. Neurologic:  Normal speech and language. No gross focal neurologic deficits are appreciated.  Skin:  Skin is warm and dry. Laceration as above. Additional superficial linear abrasion to the left upper lip not involving the mucosa.   ____________________________________________  RADIOLOGY  CT Head Wo Contrast  Result Date: 09/18/2019 CLINICAL DATA:  Status post fall. EXAM: CT HEAD WITHOUT CONTRAST TECHNIQUE: Contiguous axial images were obtained from the base of the skull through the vertex without intravenous contrast. COMPARISON:  None. FINDINGS: Brain: There is mild cerebral atrophy with widening of the extra-axial spaces and ventricular dilatation. There are areas of decreased attenuation within the white matter tracts of the supratentorial brain, consistent with microvascular disease changes. Vascular: No hyperdense vessel or unexpected calcification. Skull: Bilateral comminuted nasal bone fractures are seen. Sinuses/Orbits: No acute finding. Other: There is mild to moderate severity  bilateral paranasal soft tissue swelling. IMPRESSION: 1. Bilateral comminuted nasal bone fractures. 2. Mild to moderate severity bilateral paranasal soft tissue swelling. 3. No acute intracranial abnormality. Electronically Signed   By: Virgina Norfolk M.D.   On: 09/18/2019 21:41   CT Maxillofacial Wo Contrast  Result Date: 09/18/2019 CLINICAL DATA:  Status post fall. EXAM: CT MAXILLOFACIAL WITHOUT CONTRAST TECHNIQUE: Multidetector CT imaging of the maxillofacial structures was performed. Multiplanar CT image reconstructions were also generated. COMPARISON:  None. FINDINGS: Osseous: Bilateral comminuted nasal bone fractures are seen. Orbits: Negative. No traumatic or inflammatory finding. Sinuses: Clear. Soft tissues: There is mild to moderate severity bilateral paranasal soft tissue swelling. Limited intracranial: No significant or unexpected finding. IMPRESSION: 1. Bilateral comminuted nasal bone fractures. 2. Mild to moderate severity bilateral paranasal soft tissue swelling. Electronically Signed   By: Virgina Norfolk M.D.   On: 09/18/2019 21:54    ____________________________________________   PROCEDURES  Procedure(s) performed:   Marland KitchenMarland KitchenLaceration Repair  Date/Time: 09/18/2019 10:11 PM Performed by: Margette Fast, MD Authorized by: Laverta Baltimore  Wonda Olds, MD   Consent:    Consent obtained:  Verbal   Consent given by:  Patient   Risks discussed:  Infection, need for additional repair, nerve damage, poor wound healing, poor cosmetic result, pain, retained foreign body and vascular damage Anesthesia (see MAR for exact dosages):    Anesthesia method:  Local infiltration   Local anesthetic:  Lidocaine 1% w/o epi Laceration details:    Location:  Face   Face location:  Nose   Length (cm):  4 Repair type:    Repair type:  Simple Pre-procedure details:    Preparation:  Patient was prepped and draped in usual sterile fashion and imaging obtained to evaluate for foreign bodies Exploration:     Hemostasis achieved with:  Direct pressure   Wound exploration: entire depth of wound probed and visualized     Wound extent: underlying fracture     Wound extent: no foreign bodies/material noted, no muscle damage noted, no nerve damage noted and no vascular damage noted     Contaminated: no   Treatment:    Area cleansed with:  Betadine and saline   Amount of cleaning:  Standard Skin repair:    Repair method:  Sutures   Suture size:  6-0   Wound skin closure material used: vicryl      ____________________________________________   INITIAL IMPRESSION / ASSESSMENT AND PLAN / ED COURSE  Pertinent labs & imaging results that were available during my care of the patient were reviewed by me and considered in my medical decision making (see chart for details).   CT imaging reviewed and laceration repaired as above. Underlying nasal bone fx. Plan for abx and nose blowing/open mouth sneezing precautions. Will start abx and have the patient f/u with ENT. No orbital fracture, mandible fracture, or other surgical findings. Discussed ED return precautions.    ____________________________________________  FINAL CLINICAL IMPRESSION(S) / ED DIAGNOSES  Final diagnoses:  Fall, initial encounter  Open fracture of nasal bone, initial encounter  Facial laceration, initial encounter     MEDICATIONS GIVEN DURING THIS VISIT:  Medications  lidocaine (PF) (XYLOCAINE) 1 % injection 5 mL (5 mLs Infiltration Given 09/18/19 2031)  doxycycline (VIBRA-TABS) tablet 100 mg (100 mg Oral Given 09/18/19 2234)     NEW OUTPATIENT MEDICATIONS STARTED DURING THIS VISIT:  Discharge Medication List as of 09/18/2019 10:19 PM    START taking these medications   Details  doxycycline (VIBRAMYCIN) 100 MG capsule Take 1 capsule (100 mg total) by mouth 2 (two) times daily for 7 days., Starting Tue 09/18/2019, Until Tue 09/25/2019, Normal        Note:  This document was prepared using Dragon voice recognition  software and may include unintentional dictation errors.  Nanda Quinton, MD, [facility] Emergency Medicine    Kairyn Olmeda, Wonda Olds, MD 09/20/19 [phone]

## 2019-09-18 NOTE — ED Triage Notes (Signed)
Fell today, laceration to bridge of nose with some epitaxis

## 2019-09-18 NOTE — Discharge Instructions (Signed)
You were seen in the emergency department today with face injury and laceration.  We closed this laceration with stitches that will absorb on their own.  You did fracture your nose and I am starting you on antibiotics.  If you need to sneeze you need to keep your mouth open while sneezing.  Do not blow your nose.  You can apply ice over a small washcloth to avoid direct contact with your face.  Please be very gentle when cleaning around your cut as this could open the wound and disrupt the stitches.   Please call the ear nose and throat doctor listed to schedule a follow-up appointment in the coming week.  Return to the emergency department with any new or suddenly worsening symptoms especially fever, severe headache, or clear/watery drainage from the nose.

## 2019-09-24 DIAGNOSIS — S022XXB Fracture of nasal bones, initial encounter for open fracture: Secondary | ICD-10-CM | POA: Diagnosis not present

## 2019-12-06 ENCOUNTER — Ambulatory Visit: Payer: Medicare Other | Attending: Internal Medicine

## 2019-12-06 DIAGNOSIS — Z23 Encounter for immunization: Secondary | ICD-10-CM

## 2019-12-06 NOTE — Progress Notes (Signed)
   Covid-19 Vaccination Clinic  Name:  Stephen Rowe    MRN: [id number] DOB: 07-Mar-1940  12/06/2019  [name] was observed post Covid-19 immunization for 15 minutes without incident. He was provided with Vaccine Information Sheet and instruction to access the V-Safe system.   [name] was instructed to call 911 with any severe reactions post vaccine: Marland Kitchen Difficulty breathing  . Swelling of face and throat  . A fast heartbeat  . A bad rash all over body  . Dizziness and weakness

## 2019-12-18 DIAGNOSIS — E119 Type 2 diabetes mellitus without complications: Secondary | ICD-10-CM | POA: Diagnosis not present

## 2019-12-25 DIAGNOSIS — Z85828 Personal history of other malignant neoplasm of skin: Secondary | ICD-10-CM | POA: Diagnosis not present

## 2019-12-25 DIAGNOSIS — D1801 Hemangioma of skin and subcutaneous tissue: Secondary | ICD-10-CM | POA: Diagnosis not present

## 2019-12-25 DIAGNOSIS — D225 Melanocytic nevi of trunk: Secondary | ICD-10-CM | POA: Diagnosis not present

## 2019-12-25 DIAGNOSIS — L821 Other seborrheic keratosis: Secondary | ICD-10-CM | POA: Diagnosis not present

## 2019-12-25 DIAGNOSIS — L570 Actinic keratosis: Secondary | ICD-10-CM | POA: Diagnosis not present

## 2019-12-25 DIAGNOSIS — L814 Other melanin hyperpigmentation: Secondary | ICD-10-CM | POA: Diagnosis not present

## 2020-02-05 DIAGNOSIS — I4892 Unspecified atrial flutter: Secondary | ICD-10-CM | POA: Insufficient documentation

## 2020-02-05 DIAGNOSIS — R002 Palpitations: Secondary | ICD-10-CM | POA: Diagnosis not present

## 2020-02-05 DIAGNOSIS — I4510 Unspecified right bundle-branch block: Secondary | ICD-10-CM | POA: Diagnosis not present

## 2020-02-05 DIAGNOSIS — I2510 Atherosclerotic heart disease of native coronary artery without angina pectoris: Secondary | ICD-10-CM | POA: Diagnosis not present

## 2020-02-05 DIAGNOSIS — E789 Disorder of lipoprotein metabolism, unspecified: Secondary | ICD-10-CM | POA: Diagnosis not present

## 2020-02-05 DIAGNOSIS — I4891 Unspecified atrial fibrillation: Secondary | ICD-10-CM | POA: Diagnosis not present

## 2020-02-12 DIAGNOSIS — I498 Other specified cardiac arrhythmias: Secondary | ICD-10-CM | POA: Diagnosis not present

## 2020-02-12 DIAGNOSIS — I4510 Unspecified right bundle-branch block: Secondary | ICD-10-CM | POA: Diagnosis not present

## 2020-02-16 DIAGNOSIS — I4510 Unspecified right bundle-branch block: Secondary | ICD-10-CM | POA: Diagnosis not present

## 2020-02-16 DIAGNOSIS — I498 Other specified cardiac arrhythmias: Secondary | ICD-10-CM | POA: Diagnosis not present

## 2020-03-04 DIAGNOSIS — I4892 Unspecified atrial flutter: Secondary | ICD-10-CM | POA: Diagnosis not present

## 2020-03-05 DIAGNOSIS — I2510 Atherosclerotic heart disease of native coronary artery without angina pectoris: Secondary | ICD-10-CM | POA: Diagnosis not present

## 2020-03-05 DIAGNOSIS — I484 Atypical atrial flutter: Secondary | ICD-10-CM | POA: Diagnosis not present

## 2020-03-05 DIAGNOSIS — I4892 Unspecified atrial flutter: Secondary | ICD-10-CM | POA: Diagnosis not present

## 2020-03-05 DIAGNOSIS — I472 Ventricular tachycardia: Secondary | ICD-10-CM | POA: Diagnosis not present

## 2020-03-05 DIAGNOSIS — E789 Disorder of lipoprotein metabolism, unspecified: Secondary | ICD-10-CM | POA: Diagnosis not present

## 2020-03-24 DIAGNOSIS — I2510 Atherosclerotic heart disease of native coronary artery without angina pectoris: Secondary | ICD-10-CM | POA: Diagnosis not present

## 2020-03-24 DIAGNOSIS — I4892 Unspecified atrial flutter: Secondary | ICD-10-CM | POA: Diagnosis not present

## 2020-07-02 DIAGNOSIS — I2510 Atherosclerotic heart disease of native coronary artery without angina pectoris: Secondary | ICD-10-CM | POA: Diagnosis not present

## 2020-07-02 DIAGNOSIS — E118 Type 2 diabetes mellitus with unspecified complications: Secondary | ICD-10-CM | POA: Diagnosis not present

## 2020-07-02 DIAGNOSIS — Z1389 Encounter for screening for other disorder: Secondary | ICD-10-CM | POA: Diagnosis not present

## 2020-07-02 DIAGNOSIS — Z0001 Encounter for general adult medical examination with abnormal findings: Secondary | ICD-10-CM | POA: Diagnosis not present

## 2020-07-02 DIAGNOSIS — E7849 Other hyperlipidemia: Secondary | ICD-10-CM | POA: Diagnosis not present

## 2020-07-02 DIAGNOSIS — M1991 Primary osteoarthritis, unspecified site: Secondary | ICD-10-CM | POA: Diagnosis not present

## 2020-07-02 DIAGNOSIS — M48061 Spinal stenosis, lumbar region without neurogenic claudication: Secondary | ICD-10-CM | POA: Diagnosis not present

## 2020-07-02 DIAGNOSIS — I10 Essential (primary) hypertension: Secondary | ICD-10-CM | POA: Diagnosis not present

## 2020-11-21 ENCOUNTER — Other Ambulatory Visit (HOSPITAL_COMMUNITY): Payer: Self-pay | Admitting: Family Medicine

## 2020-11-21 DIAGNOSIS — I4891 Unspecified atrial fibrillation: Secondary | ICD-10-CM | POA: Diagnosis not present

## 2020-11-21 DIAGNOSIS — I10 Essential (primary) hypertension: Secondary | ICD-10-CM | POA: Diagnosis not present

## 2020-11-21 DIAGNOSIS — M1991 Primary osteoarthritis, unspecified site: Secondary | ICD-10-CM | POA: Diagnosis not present

## 2020-11-21 DIAGNOSIS — E782 Mixed hyperlipidemia: Secondary | ICD-10-CM | POA: Diagnosis not present

## 2020-11-21 DIAGNOSIS — M48062 Spinal stenosis, lumbar region with neurogenic claudication: Secondary | ICD-10-CM | POA: Diagnosis not present

## 2020-11-21 DIAGNOSIS — E118 Type 2 diabetes mellitus with unspecified complications: Secondary | ICD-10-CM | POA: Diagnosis not present

## 2020-11-21 DIAGNOSIS — I2510 Atherosclerotic heart disease of native coronary artery without angina pectoris: Secondary | ICD-10-CM | POA: Diagnosis not present

## 2020-11-24 ENCOUNTER — Other Ambulatory Visit (HOSPITAL_COMMUNITY): Payer: Self-pay | Admitting: Family Medicine

## 2020-11-24 ENCOUNTER — Other Ambulatory Visit (HOSPITAL_COMMUNITY): Payer: Self-pay | Admitting: Cardiothoracic Surgery

## 2020-11-24 ENCOUNTER — Other Ambulatory Visit: Payer: Self-pay

## 2020-11-24 ENCOUNTER — Ambulatory Visit (HOSPITAL_COMMUNITY)
Admission: RE | Admit: 2020-11-24 | Discharge: 2020-11-24 | Disposition: A | Discharge status: Home / Self Care | Payer: Medicare Other | Source: Ambulatory Visit | Attending: Family Medicine | Admitting: Family Medicine

## 2020-11-24 DIAGNOSIS — M5124 Other intervertebral disc displacement, thoracic region: Secondary | ICD-10-CM

## 2020-11-24 DIAGNOSIS — M419 Scoliosis, unspecified: Secondary | ICD-10-CM | POA: Diagnosis not present

## 2020-11-24 DIAGNOSIS — M47814 Spondylosis without myelopathy or radiculopathy, thoracic region: Secondary | ICD-10-CM | POA: Diagnosis not present

## 2020-11-24 DIAGNOSIS — M48062 Spinal stenosis, lumbar region with neurogenic claudication: Secondary | ICD-10-CM

## 2020-11-24 DIAGNOSIS — M545 Low back pain, unspecified: Secondary | ICD-10-CM | POA: Diagnosis not present

## 2020-11-24 DIAGNOSIS — Z981 Arthrodesis status: Secondary | ICD-10-CM | POA: Diagnosis not present

## 2020-11-27 ENCOUNTER — Other Ambulatory Visit: Payer: Self-pay | Admitting: Neurosurgery

## 2020-11-27 DIAGNOSIS — T84216A Breakdown (mechanical) of internal fixation device of vertebrae, initial encounter: Secondary | ICD-10-CM | POA: Diagnosis not present

## 2020-11-27 DIAGNOSIS — M5440 Lumbago with sciatica, unspecified side: Secondary | ICD-10-CM | POA: Diagnosis not present

## 2020-12-02 ENCOUNTER — Ambulatory Visit
Admission: RE | Admit: 2020-12-02 | Discharge: 2020-12-02 | Disposition: A | Discharge status: Home / Self Care | Payer: Medicare Other | Source: Ambulatory Visit | Attending: Neurosurgery | Admitting: Neurosurgery

## 2020-12-02 ENCOUNTER — Other Ambulatory Visit: Payer: Medicare Other

## 2020-12-02 DIAGNOSIS — M5440 Lumbago with sciatica, unspecified side: Secondary | ICD-10-CM

## 2020-12-02 DIAGNOSIS — M545 Low back pain, unspecified: Secondary | ICD-10-CM | POA: Diagnosis not present

## 2020-12-11 DIAGNOSIS — S32009K Unspecified fracture of unspecified lumbar vertebra, subsequent encounter for fracture with nonunion: Secondary | ICD-10-CM | POA: Diagnosis not present

## 2020-12-11 DIAGNOSIS — I10 Essential (primary) hypertension: Secondary | ICD-10-CM | POA: Diagnosis not present

## 2020-12-19 DIAGNOSIS — I4892 Unspecified atrial flutter: Secondary | ICD-10-CM | POA: Diagnosis not present

## 2020-12-19 DIAGNOSIS — I2510 Atherosclerotic heart disease of native coronary artery without angina pectoris: Secondary | ICD-10-CM | POA: Diagnosis not present

## 2020-12-22 DIAGNOSIS — I491 Atrial premature depolarization: Secondary | ICD-10-CM | POA: Diagnosis not present

## 2020-12-22 DIAGNOSIS — I2510 Atherosclerotic heart disease of native coronary artery without angina pectoris: Secondary | ICD-10-CM | POA: Diagnosis not present

## 2020-12-22 DIAGNOSIS — I4510 Unspecified right bundle-branch block: Secondary | ICD-10-CM | POA: Diagnosis not present

## 2021-01-06 ENCOUNTER — Other Ambulatory Visit: Payer: Self-pay | Admitting: Neurosurgery

## 2021-01-09 NOTE — Progress Notes (Signed)
Surgical Instructions    Your procedure is scheduled on Wednesday December 14th.  Report to [facility] Main Entrance "A" at 9:30 A.M., then check in with the Admitting office.  Call this number if you have problems the morning of surgery:  [phone]   If you have any questions prior to your surgery date call [phone]: Open Monday-Friday 8am-4pm    Remember:  Do not eat or drink anything after midnight the night before your surgery     Take these medicines the morning of surgery with A SIP OF WATER metoprolol succinate (TOPROL-XL) 25 MG 24 hr tablet rosuvastatin (CRESTOR) 5 MG tablet  Follow your surgeon's instructions on when to stop Eliquis.  If no instructions were given by your surgeon then you will need to call the office to get those instructions.     As of today, STOP taking any Aspirin (unless otherwise instructed by your surgeon) Aleve, Naproxen, Ibuprofen, Motrin, Advil, Goody's, BC's, all herbal medications, fish oil, and all vitamins.  WHAT DO I DO ABOUT MY DIABETES MEDICATION?   Do not take oral diabetes medicines (Metformin) the morning of surgery.    HOW TO MANAGE YOUR DIABETES BEFORE AND AFTER SURGERY  Why is it important to control my blood sugar before and after surgery? Improving blood sugar levels before and after surgery helps healing and can limit problems. A way of improving blood sugar control is eating a healthy diet by:  Eating less sugar and carbohydrates  Increasing activity/exercise  Talking with your doctor about reaching your blood sugar goals High blood sugars (greater than 180 mg/dL) can raise your risk of infections and slow your recovery, so you will need to focus on controlling your diabetes during the weeks before surgery. Make sure that the doctor who takes care of your diabetes knows about your planned surgery including the date and location.  How do I manage my blood sugar before surgery? Check your blood sugar at least 4 times  a day, starting 2 days before surgery, to make sure that the level is not too high or low.  Check your blood sugar the morning of your surgery when you wake up and every 2 hours until you get to the Short Stay unit.  If your blood sugar is less than 70 mg/dL, you will need to treat for low blood sugar: Do not take insulin. Treat a low blood sugar (less than 70 mg/dL) with  cup of clear juice (cranberry or apple), 4 glucose tablets, OR glucose gel. Recheck blood sugar in 15 minutes after treatment (to make sure it is greater than 70 mg/dL). If your blood sugar is not greater than 70 mg/dL on recheck, call [phone] for further instructions. Report your blood sugar to the short stay nurse when you get to Short Stay.  If you are admitted to the hospital after surgery: Your blood sugar will be checked by the staff and you will probably be given insulin after surgery (instead of oral diabetes medicines) to make sure you have good blood sugar levels. The goal for blood sugar control after surgery is 80-180 mg/dL.    After your COVID test   You are not required to quarantine however you are required to wear a well-fitting mask when you are out and around people not in your household.  If your mask becomes wet or soiled, replace with a new one.  Wash your hands often with soap and water for 20 seconds or clean your hands with an alcohol-based  hand sanitizer that contains at least 60% alcohol.  Do not share personal items.  Notify your provider: if you are in close contact with someone who has COVID  or if you develop a fever of 100.4 or greater, sneezing, cough, sore throat, shortness of breath or body aches.             Do not wear jewelry  Do not wear lotions, powders, colognes, or deodorant. Do not shave 48 hours prior to surgery.  Men may shave face and neck. Do not bring valuables to the hospital. DO Not wear nail polish, gel polish, artificial nails, or any other type of covering  on natural nails including finger and toenails. If patients have artificial nails, gel coating, etc. that need to be removed by a nail salon, please have this removed prior to surgery or surgery may need to be canceled/delayed if the surgeon/ anesthesia feels like the patient is unable to be adequately monitored.             [facility] is not responsible for any belongings or valuables.  Do NOT Smoke (Tobacco/Vaping)  24 hours prior to your procedure  If you use a CPAP at night, you may bring your mask for your overnight stay.   Contacts, glasses, hearing aids, dentures or partials may not be worn into surgery, please bring cases for these belongings   For patients admitted to the hospital, discharge time will be determined by your treatment team.   Patients discharged the day of surgery will not be allowed to drive home, and someone needs to stay with them for 24 hours.  NO VISITORS WILL BE ALLOWED IN PRE-OP WHERE PATIENTS ARE PREPPED FOR SURGERY.  ONLY 1 SUPPORT PERSON MAY BE PRESENT IN THE WAITING ROOM WHILE YOU ARE IN SURGERY.  IF YOU ARE TO BE ADMITTED, ONCE YOU ARE IN YOUR ROOM YOU WILL BE ALLOWED TWO (2) VISITORS. 1 (ONE) VISITOR MAY STAY OVERNIGHT BUT MUST ARRIVE TO THE ROOM BY 8pm.  Minor children may have two parents present. Special consideration for safety and communication needs will be reviewed on a case by case basis.  Special instructions:    Oral Hygiene is also important to reduce your risk of infection.  Remember - BRUSH YOUR TEETH THE MORNING OF SURGERY WITH YOUR REGULAR TOOTHPASTE   [place]- Preparing For Surgery  Before surgery, you can play an important role. Because skin is not sterile, your skin needs to be as free of germs as possible. You can reduce the number of germs on your skin by washing with CHG (chlorahexidine gluconate) Soap before surgery.  CHG is an antiseptic cleaner which kills germs and bonds with the skin to continue killing germs even after  washing.     Please do not use if you have an allergy to CHG or antibacterial soaps. If your skin becomes reddened/irritated stop using the CHG.  Do not shave (including legs and underarms) for at least 48 hours prior to first CHG shower. It is OK to shave your face.  Please follow these instructions carefully.     Shower the NIGHT BEFORE SURGERY and the MORNING OF SURGERY with CHG Soap.   If you chose to wash your hair, wash your hair first as usual with your normal shampoo. After you shampoo, rinse your hair and body thoroughly to remove the shampoo.  Then [employer] and genitals (private parts) with your normal soap and rinse thoroughly to remove soap.  After that  Use CHG Soap as you would any other liquid soap. You can apply CHG directly to the skin and wash gently with a scrungie or a clean washcloth.   Apply the CHG Soap to your body ONLY FROM THE NECK DOWN.  Do not use on open wounds or open sores. Avoid contact with your eyes, ears, mouth and genitals (private parts). Wash Face and genitals (private parts)  with your normal soap.   Wash thoroughly, paying special attention to the area where your surgery will be performed.  Thoroughly rinse your body with warm water from the neck down.  DO NOT shower/wash with your normal soap after using and rinsing off the CHG Soap.  Pat yourself dry with a CLEAN TOWEL.  Wear CLEAN PAJAMAS to bed the night before surgery  Place CLEAN SHEETS on your bed the night before your surgery  DO NOT SLEEP WITH PETS.   Day of Surgery:  Take a shower with CHG soap. Wear Clean/Comfortable clothing the morning of surgery Do not apply any deodorants/lotions.   Remember to brush your teeth WITH YOUR REGULAR TOOTHPASTE.   Please read over the following fact sheets that you were given.

## 2021-01-12 ENCOUNTER — Other Ambulatory Visit: Payer: Self-pay

## 2021-01-12 ENCOUNTER — Encounter (HOSPITAL_COMMUNITY)
Admission: RE | Admit: 2021-01-12 | Discharge: 2021-01-12 | Disposition: A | Discharge status: Home / Self Care | Payer: Medicare Other | Source: Ambulatory Visit | Attending: Neurosurgery | Admitting: Neurosurgery

## 2021-01-12 ENCOUNTER — Encounter (HOSPITAL_COMMUNITY): Payer: Self-pay

## 2021-01-12 VITALS — BP 129/80 | HR 71 | Temp 98.3°F | Resp 18 | Ht 71.0 in | Wt 204.3 lb

## 2021-01-12 DIAGNOSIS — E785 Hyperlipidemia, unspecified: Secondary | ICD-10-CM | POA: Insufficient documentation

## 2021-01-12 DIAGNOSIS — Z20822 Contact with and (suspected) exposure to covid-19: Secondary | ICD-10-CM | POA: Insufficient documentation

## 2021-01-12 DIAGNOSIS — M960 Pseudarthrosis after fusion or arthrodesis: Secondary | ICD-10-CM | POA: Diagnosis not present

## 2021-01-12 DIAGNOSIS — I2510 Atherosclerotic heart disease of native coronary artery without angina pectoris: Secondary | ICD-10-CM

## 2021-01-12 DIAGNOSIS — T84018A Broken internal joint prosthesis, other site, initial encounter: Secondary | ICD-10-CM | POA: Diagnosis not present

## 2021-01-12 DIAGNOSIS — Z7984 Long term (current) use of oral hypoglycemic drugs: Secondary | ICD-10-CM | POA: Diagnosis not present

## 2021-01-12 DIAGNOSIS — I484 Atypical atrial flutter: Secondary | ICD-10-CM | POA: Insufficient documentation

## 2021-01-12 DIAGNOSIS — E119 Type 2 diabetes mellitus without complications: Secondary | ICD-10-CM

## 2021-01-12 DIAGNOSIS — K219 Gastro-esophageal reflux disease without esophagitis: Secondary | ICD-10-CM | POA: Diagnosis not present

## 2021-01-12 DIAGNOSIS — Z01812 Encounter for preprocedural laboratory examination: Secondary | ICD-10-CM | POA: Insufficient documentation

## 2021-01-12 DIAGNOSIS — I4510 Unspecified right bundle-branch block: Secondary | ICD-10-CM | POA: Diagnosis not present

## 2021-01-12 DIAGNOSIS — Y792 Prosthetic and other implants, materials and accessory orthopedic devices associated with adverse incidents: Secondary | ICD-10-CM | POA: Diagnosis present

## 2021-01-12 DIAGNOSIS — Z7901 Long term (current) use of anticoagulants: Secondary | ICD-10-CM | POA: Insufficient documentation

## 2021-01-12 DIAGNOSIS — S32030A Wedge compression fracture of third lumbar vertebra, initial encounter for closed fracture: Secondary | ICD-10-CM | POA: Diagnosis not present

## 2021-01-12 DIAGNOSIS — Z87891 Personal history of nicotine dependence: Secondary | ICD-10-CM | POA: Diagnosis not present

## 2021-01-12 DIAGNOSIS — Z01818 Encounter for other preprocedural examination: Secondary | ICD-10-CM

## 2021-01-12 DIAGNOSIS — T84216A Breakdown (mechanical) of internal fixation device of vertebrae, initial encounter: Secondary | ICD-10-CM | POA: Diagnosis not present

## 2021-01-12 DIAGNOSIS — M4316 Spondylolisthesis, lumbar region: Secondary | ICD-10-CM | POA: Diagnosis not present

## 2021-01-12 DIAGNOSIS — Z79899 Other long term (current) drug therapy: Secondary | ICD-10-CM | POA: Diagnosis not present

## 2021-01-12 DIAGNOSIS — Z85828 Personal history of other malignant neoplasm of skin: Secondary | ICD-10-CM | POA: Diagnosis not present

## 2021-01-12 DIAGNOSIS — T84038A Mechanical loosening of other internal prosthetic joint, initial encounter: Secondary | ICD-10-CM | POA: Diagnosis not present

## 2021-01-12 DIAGNOSIS — M545 Low back pain, unspecified: Secondary | ICD-10-CM | POA: Diagnosis present

## 2021-01-12 HISTORY — DX: Type 2 diabetes mellitus without complications: E11.9

## 2021-01-12 LAB — BASIC METABOLIC PANEL
Anion gap: 8 (ref 5–15)
BUN: 11 mg/dL (ref 8–23)
CO2: 27 mmol/L (ref 22–32)
Calcium: 9.2 mg/dL (ref 8.9–10.3)
Chloride: 104 mmol/L (ref 98–111)
Creatinine, Ser: 0.8 mg/dL (ref 0.61–1.24)
GFR, Estimated: >60 mL/min (ref >60)
Glucose, Bld: 107 mg/dL — ABNORMAL HIGH (ref 70–99)
Potassium: 4.1 mmol/L (ref 3.5–5.1)
Sodium: 139 mmol/L (ref 135–145)

## 2021-01-12 LAB — SURGICAL PCR SCREEN
MRSA, PCR: NEGATIVE
Staphylococcus aureus: NEGATIVE

## 2021-01-12 LAB — CBC
HCT: 49 % (ref 39.0–52.0)
Hemoglobin: 16.5 g/dL (ref 13.0–17.0)
MCH: 30.9 pg (ref 26.0–34.0)
MCHC: 33.7 g/dL (ref 30.0–36.0)
MCV: 91.8 fL (ref 80.0–100.0)
Platelets: 201 10*3/uL (ref 150–400)
RBC: 5.34 MIL/uL (ref 4.22–5.81)
RDW: 12.2 % (ref 11.5–15.5)
WBC: 7.9 10*3/uL (ref 4.0–10.5)
nRBC: 0 % (ref 0.0–0.2)

## 2021-01-12 LAB — TYPE AND SCREEN
ABO/RH(D): O POS
Antibody Screen: NEGATIVE

## 2021-01-12 LAB — GLUCOSE, CAPILLARY: Glucose-Capillary: 107 mg/dL — ABNORMAL HIGH (ref 70–99)

## 2021-01-12 LAB — SARS CORONAVIRUS 2 (TAT 6-24 HRS): SARS Coronavirus 2: NEGATIVE

## 2021-01-12 LAB — HEMOGLOBIN A1C
Hgb A1c MFr Bld: 6.6 % — ABNORMAL HIGH (ref 4.8–5.6)
Mean Plasma Glucose: 142.72 mg/dL

## 2021-01-12 NOTE — Progress Notes (Signed)
PCP - Dr. Redmond School Cardiologist - Dr. Cherie Ouch  PPM/ICD -   Chest x-ray - n/a  EKG - 12/19/20-requested from Dr. Gilman Schmidt office Stress Test - 10+ years ago ECHO - 03/24/20-CE Cardiac Cath - 20+ years ago  Sleep Study - denies CPAP - denies  CBG at PAT: 107 Fasting Blood Sugar -100-115  Checks Blood Sugar 1 time a month Last A1C: 6.1 in 10/22  Blood Thinner Instructions: Eliquis; LD 01/09/21 Aspirin Instructions: n/a  COVID TEST-01/12/21, done in PAT  Anesthesia review: Yes, Hx of CAD. Per pt, he was cleared by cardiology. Requested last office note and EKG tracing from [facility] in [facility].   Patient denies shortness of breath, fever, cough and chest pain at PAT appointment   All instructions explained to the patient, with a verbal understanding of the material. Patient agrees to go over the instructions while at home for a better understanding. Patient also instructed to self quarantine after being tested for COVID-19. The opportunity to ask questions was provided.

## 2021-01-13 NOTE — Anesthesia Preprocedure Evaluation (Addendum)
Anesthesia Evaluation  Patient identified by MRN, date of birth, ID band Patient awake    Reviewed: Allergy & Precautions, NPO status , Patient's Chart, lab work & pertinent test results  History of Anesthesia Complications (+) PONV  Airway Mallampati: II  TM Distance: >3 FB Neck ROM: Full    Dental no notable dental hx.    Pulmonary neg pulmonary ROS, former smoker,    Pulmonary exam normal        Cardiovascular Pt. on medications and Pt. on home beta blockers + CAD  + dysrhythmias (on Eliquis, last dose 01/10/21) Atrial Fibrillation  Rhythm:Regular Rate:Normal     Neuro/Psych negative neurological ROS  negative psych ROS   GI/Hepatic Neg liver ROS, GERD  ,  Endo/Other  diabetes, Well Controlled  Renal/GU   negative genitourinary   Musculoskeletal  (+) Arthritis , Osteoarthritis,    Abdominal Normal abdominal exam  (+)   Peds  Hematology negative hematology ROS (+)   Anesthesia Other Findings   Reproductive/Obstetrics                            Anesthesia Physical Anesthesia Plan  ASA: 3  Anesthesia Plan: General   Post-op Pain Management: Celebrex PO (pre-op) and Tylenol PO (pre-op)   Induction: Intravenous  PONV Risk Score and Plan: 3 and Ondansetron, Aprepitant, Dexamethasone and Treatment may vary due to age or medical condition  Airway Management Planned: Mask and Oral ETT  Additional Equipment: None  Intra-op Plan:   Post-operative Plan: Extubation in OR  Informed Consent: I have reviewed the patients History and Physical, chart, labs and discussed the procedure including the risks, benefits and alternatives for the proposed anesthesia with the patient or authorized representative who has indicated his/her understanding and acceptance.     Dental advisory given  Plan Discussed with: CRNA  Anesthesia Plan Comments: (PAT note by Karoline Caldwell, PA-C: Follows with  cardiologist Dr. 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 Echo 03/24/20 showed EF 55-60%, normal wall motion, normal valves.   Patient denied any cardiopulmonary symptoms at preadmission testing appointment.    Pt reports LD Eliquis 01/09/21.   Preop labs reviewed, unremarkable. DM2 well controlled, A1c 6.6.  EKG 12/19/20 (tracing requested): Sinus rhythm with PACs. Incomplete RBBB.  TTE 03/24/20 (care everywhere): SUMMARY  NPS.  Image Quality: Fair.  The left ventricular size is normal.  There is normal left ventricular wall thickness.  LV ejection fraction = 55-60%.  Left ventricular systolic function is normal.  Left ventricular filling pattern is normal.  Theleft ventricular wall motion is normal.  Estimated right ventricular systolic pressure is 24 mmHg.   )       Anesthesia Quick Evaluation

## 2021-01-13 NOTE — Progress Notes (Signed)
Anesthesia Chart Review:  Follows with cardiologist Dr. 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 Echo 03/24/20 showed EF 55-60%, normal wall motion, normal valves.   Patient denied any cardiopulmonary symptoms at preadmission testing appointment.    Pt reports LD Eliquis 01/09/21.   Preop labs reviewed, unremarkable. DM2 well controlled, A1c 6.6.  EKG 12/19/20 (tracing requested): Sinus rhythm with PACs. Incomplete RBBB.  TTE 03/24/20 (care everywhere): SUMMARY  NPS.  Image Quality: Fair.  The left ventricular size is normal.  There is normal left ventricular wall thickness.  LV ejection fraction = 55-60%.  Left ventricular systolic function is normal.  Left ventricular filling pattern is normal.  The left ventricular wall motion is normal.  Estimated right ventricular systolic pressure is 24 mmHg.   Stephen Rowe [facility] Short Stay Center/Anesthesiology Phone [phone] 01/13/2021 4:19 PM

## 2021-01-14 ENCOUNTER — Other Ambulatory Visit: Payer: Self-pay

## 2021-01-14 ENCOUNTER — Encounter (HOSPITAL_COMMUNITY): Payer: Self-pay | Admitting: Neurosurgery

## 2021-01-14 ENCOUNTER — Inpatient Hospital Stay (HOSPITAL_COMMUNITY): Payer: Medicare Other | Admitting: Certified Registered"

## 2021-01-14 ENCOUNTER — Inpatient Hospital Stay (HOSPITAL_COMMUNITY)
Admission: RE | Admit: 2021-01-14 | Discharge: 2021-01-15 | DRG: 460 | Disposition: A | Discharge status: Home / Self Care | Payer: Medicare Other | Attending: Neurosurgery | Admitting: Neurosurgery

## 2021-01-14 ENCOUNTER — Inpatient Hospital Stay (HOSPITAL_COMMUNITY): Payer: Medicare Other

## 2021-01-14 ENCOUNTER — Encounter (HOSPITAL_COMMUNITY)
Admission: RE | Disposition: A | Discharge status: Home / Self Care | Payer: Self-pay | Source: Home / Self Care | Attending: Neurosurgery

## 2021-01-14 ENCOUNTER — Inpatient Hospital Stay (HOSPITAL_COMMUNITY): Payer: Medicare Other | Admitting: Vascular Surgery

## 2021-01-14 DIAGNOSIS — E119 Type 2 diabetes mellitus without complications: Secondary | ICD-10-CM | POA: Diagnosis present

## 2021-01-14 DIAGNOSIS — Z7984 Long term (current) use of oral hypoglycemic drugs: Secondary | ICD-10-CM

## 2021-01-14 DIAGNOSIS — Z87891 Personal history of nicotine dependence: Secondary | ICD-10-CM | POA: Diagnosis not present

## 2021-01-14 DIAGNOSIS — M960 Pseudarthrosis after fusion or arthrodesis: Secondary | ICD-10-CM | POA: Diagnosis present

## 2021-01-14 DIAGNOSIS — K219 Gastro-esophageal reflux disease without esophagitis: Secondary | ICD-10-CM | POA: Diagnosis present

## 2021-01-14 DIAGNOSIS — E785 Hyperlipidemia, unspecified: Secondary | ICD-10-CM | POA: Diagnosis present

## 2021-01-14 DIAGNOSIS — T84018A Broken internal joint prosthesis, other site, initial encounter: Secondary | ICD-10-CM | POA: Diagnosis present

## 2021-01-14 DIAGNOSIS — M545 Low back pain, unspecified: Secondary | ICD-10-CM | POA: Diagnosis present

## 2021-01-14 DIAGNOSIS — Z85828 Personal history of other malignant neoplasm of skin: Secondary | ICD-10-CM

## 2021-01-14 DIAGNOSIS — Z79899 Other long term (current) drug therapy: Secondary | ICD-10-CM | POA: Diagnosis not present

## 2021-01-14 DIAGNOSIS — S32009K Unspecified fracture of unspecified lumbar vertebra, subsequent encounter for fracture with nonunion: Secondary | ICD-10-CM | POA: Diagnosis present

## 2021-01-14 DIAGNOSIS — Z419 Encounter for procedure for purposes other than remedying health state, unspecified: Secondary | ICD-10-CM

## 2021-01-14 DIAGNOSIS — Z20822 Contact with and (suspected) exposure to covid-19: Secondary | ICD-10-CM | POA: Diagnosis present

## 2021-01-14 DIAGNOSIS — T84038A Mechanical loosening of other internal prosthetic joint, initial encounter: Secondary | ICD-10-CM | POA: Diagnosis present

## 2021-01-14 DIAGNOSIS — I4510 Unspecified right bundle-branch block: Secondary | ICD-10-CM | POA: Diagnosis present

## 2021-01-14 DIAGNOSIS — Z7901 Long term (current) use of anticoagulants: Secondary | ICD-10-CM | POA: Diagnosis not present

## 2021-01-14 DIAGNOSIS — Y792 Prosthetic and other implants, materials and accessory orthopedic devices associated with adverse incidents: Secondary | ICD-10-CM | POA: Diagnosis present

## 2021-01-14 DIAGNOSIS — I2510 Atherosclerotic heart disease of native coronary artery without angina pectoris: Secondary | ICD-10-CM | POA: Diagnosis present

## 2021-01-14 HISTORY — PX: LAMINECTOMY WITH POSTERIOR LATERAL ARTHRODESIS LEVEL 2: SHX6336

## 2021-01-14 LAB — GLUCOSE, CAPILLARY
Glucose-Capillary: 129 mg/dL — ABNORMAL HIGH (ref 70–99)
Glucose-Capillary: 139 mg/dL — ABNORMAL HIGH (ref 70–99)
Glucose-Capillary: 163 mg/dL — ABNORMAL HIGH (ref 70–99)

## 2021-01-14 SURGERY — LAMINECTOMY WITH POSTERIOR LATERAL ARTHRODESIS LEVEL 2
Anesthesia: General | Site: Back

## 2021-01-14 MED ORDER — HYDROMORPHONE HCL 2 MG PO TABS: 2.0000 mg | ORAL_TABLET | ORAL | Status: DC | PRN

## 2021-01-14 MED ORDER — ORAL CARE MOUTH RINSE: 15.0000 mL | Freq: Once | OROMUCOSAL | Status: AC

## 2021-01-14 MED ORDER — ONDANSETRON HCL 4 MG/2ML IJ SOLN
INTRAMUSCULAR | Status: DC | PRN
  Administered 2021-01-14 (×2): 4 mg via INTRAVENOUS

## 2021-01-14 MED ORDER — PHENYLEPHRINE 40 MCG/ML (10ML) SYRINGE FOR IV PUSH (FOR BLOOD PRESSURE SUPPORT)
PREFILLED_SYRINGE | INTRAVENOUS | Status: AC
  Filled 2021-01-14: qty 10

## 2021-01-14 MED ORDER — SODIUM CHLORIDE 0.9% FLUSH: 3.0000 mL | INTRAVENOUS | Status: DC | PRN

## 2021-01-14 MED ORDER — ONDANSETRON HCL 4 MG/2ML IJ SOLN
INTRAMUSCULAR | Status: AC
  Filled 2021-01-14: qty 2

## 2021-01-14 MED ORDER — PHENOL 1.4 % MT LIQD: 1.0000 | OROMUCOSAL | Status: DC | PRN

## 2021-01-14 MED ORDER — LIDOCAINE-EPINEPHRINE 1 %-1:100000 IJ SOLN
INTRAMUSCULAR | Status: AC
  Filled 2021-01-14: qty 1

## 2021-01-14 MED ORDER — THROMBIN 20000 UNITS EX SOLR: CUTANEOUS | Status: DC | PRN

## 2021-01-14 MED ORDER — OXYCODONE HCL 5 MG PO TABS
10.0000 mg | ORAL_TABLET | ORAL | Status: DC | PRN
  Administered 2021-01-14 – 2021-01-15 (×2): 5 mg via ORAL
  Filled 2021-01-14 (×3): qty 2

## 2021-01-14 MED ORDER — LIDOCAINE 2% (20 MG/ML) 5 ML SYRINGE
INTRAMUSCULAR | Status: DC | PRN
  Administered 2021-01-14: 60 mg via INTRAVENOUS

## 2021-01-14 MED ORDER — INSULIN ASPART 100 UNIT/ML IJ SOLN
0.0000 [IU] | Freq: Three times a day (TID) | INTRAMUSCULAR | Status: DC
  Administered 2021-01-14: 17:00:00 3 [IU] via SUBCUTANEOUS

## 2021-01-14 MED ORDER — FENTANYL CITRATE (PF) 100 MCG/2ML IJ SOLN
INTRAMUSCULAR | Status: AC
  Filled 2021-01-14: qty 2

## 2021-01-14 MED ORDER — PANTOPRAZOLE SODIUM 40 MG PO TBEC
40.0000 mg | DELAYED_RELEASE_TABLET | Freq: Every day | ORAL | Status: DC
  Administered 2021-01-14: 21:00:00 40 mg via ORAL
  Filled 2021-01-14: qty 1

## 2021-01-14 MED ORDER — APREPITANT 40 MG PO CAPS
40.0000 mg | ORAL_CAPSULE | Freq: Once | ORAL | Status: AC
  Administered 2021-01-14: 11:00:00 40 mg via ORAL
  Filled 2021-01-14: qty 1

## 2021-01-14 MED ORDER — CEFAZOLIN SODIUM-DEXTROSE 2-4 GM/100ML-% IV SOLN
2.0000 g | Freq: Three times a day (TID) | INTRAVENOUS | Status: DC
  Administered 2021-01-14 – 2021-01-15 (×2): 2 g via INTRAVENOUS
  Filled 2021-01-14 (×2): qty 100

## 2021-01-14 MED ORDER — PROMETHAZINE HCL 25 MG/ML IJ SOLN: 6.2500 mg | INTRAMUSCULAR | Status: DC | PRN

## 2021-01-14 MED ORDER — HYDROMORPHONE HCL 1 MG/ML IJ SOLN
0.5000 mg | INTRAMUSCULAR | Status: DC | PRN
  Administered 2021-01-14: 15:00:00 0.5 mg via INTRAVENOUS
  Filled 2021-01-14: qty 0.5

## 2021-01-14 MED ORDER — OXYCODONE-ACETAMINOPHEN 5-325 MG PO TABS: 1.0000 | ORAL_TABLET | ORAL | Status: DC | PRN

## 2021-01-14 MED ORDER — LIDOCAINE-EPINEPHRINE 1 %-1:100000 IJ SOLN
INTRAMUSCULAR | Status: DC | PRN
  Administered 2021-01-14: 10 mL

## 2021-01-14 MED ORDER — PHENYLEPHRINE HCL-NACL 20-0.9 MG/250ML-% IV SOLN
INTRAVENOUS | Status: DC | PRN
  Administered 2021-01-14: 20 ug/min via INTRAVENOUS

## 2021-01-14 MED ORDER — CAMPHOR-MENTHOL-METHYL SAL 3.1-6-10 % EX PTCH: 1.0000 | MEDICATED_PATCH | Freq: Every day | CUTANEOUS | Status: DC | PRN

## 2021-01-14 MED ORDER — PROPOFOL 10 MG/ML IV BOLUS
INTRAVENOUS | Status: AC
  Filled 2021-01-14: qty 20

## 2021-01-14 MED ORDER — LACTATED RINGERS IV SOLN: INTRAVENOUS | Status: DC

## 2021-01-14 MED ORDER — ACETAMINOPHEN 325 MG PO TABS
650.0000 mg | ORAL_TABLET | ORAL | Status: DC | PRN
  Administered 2021-01-15: 650 mg via ORAL
  Filled 2021-01-14: qty 2

## 2021-01-14 MED ORDER — THROMBIN 20000 UNITS EX SOLR
CUTANEOUS | Status: AC
  Filled 2021-01-14: qty 20000

## 2021-01-14 MED ORDER — CHLORHEXIDINE GLUCONATE 0.12 % MT SOLN
15.0000 mL | Freq: Once | OROMUCOSAL | Status: AC
  Administered 2021-01-14: 10:00:00 15 mL via OROMUCOSAL
  Filled 2021-01-14: qty 15

## 2021-01-14 MED ORDER — ROCURONIUM BROMIDE 100 MG/10ML IV SOLN
INTRAVENOUS | Status: DC | PRN
  Administered 2021-01-14: 70 mg via INTRAVENOUS
  Administered 2021-01-14: 10 mg via INTRAVENOUS

## 2021-01-14 MED ORDER — ONDANSETRON HCL 4 MG/2ML IJ SOLN: 4.0000 mg | Freq: Four times a day (QID) | INTRAMUSCULAR | Status: DC | PRN

## 2021-01-14 MED ORDER — CEFAZOLIN SODIUM-DEXTROSE 2-4 GM/100ML-% IV SOLN
2.0000 g | INTRAVENOUS | Status: AC
  Administered 2021-01-14: 11:00:00 2 g via INTRAVENOUS
  Filled 2021-01-14: qty 100

## 2021-01-14 MED ORDER — METOPROLOL SUCCINATE ER 25 MG PO TB24
25.0000 mg | ORAL_TABLET | Freq: Every day | ORAL | Status: DC
  Administered 2021-01-15: 25 mg via ORAL
  Filled 2021-01-14: qty 1

## 2021-01-14 MED ORDER — BUPIVACAINE LIPOSOME 1.3 % IJ SUSP
INTRAMUSCULAR | Status: DC | PRN
  Administered 2021-01-14: 20 mL

## 2021-01-14 MED ORDER — 0.9 % SODIUM CHLORIDE (POUR BTL) OPTIME
TOPICAL | Status: DC | PRN
  Administered 2021-01-14: 12:00:00 1000 mL

## 2021-01-14 MED ORDER — ALUM & MAG HYDROXIDE-SIMETH 200-200-20 MG/5ML PO SUSP: 30.0000 mL | Freq: Four times a day (QID) | ORAL | Status: DC | PRN

## 2021-01-14 MED ORDER — ACETAMINOPHEN 500 MG PO TABS
1000.0000 mg | ORAL_TABLET | Freq: Once | ORAL | Status: AC
  Administered 2021-01-14: 11:00:00 1000 mg via ORAL
  Filled 2021-01-14: qty 2

## 2021-01-14 MED ORDER — FENTANYL CITRATE (PF) 100 MCG/2ML IJ SOLN
25.0000 ug | INTRAMUSCULAR | Status: DC | PRN
  Administered 2021-01-14: 14:00:00 50 ug via INTRAVENOUS

## 2021-01-14 MED ORDER — SODIUM CHLORIDE 0.9 % IV SOLN: 250.0000 mL | INTRAVENOUS | Status: DC

## 2021-01-14 MED ORDER — CHLORHEXIDINE GLUCONATE CLOTH 2 % EX PADS: 6.0000 | MEDICATED_PAD | Freq: Once | CUTANEOUS | Status: DC

## 2021-01-14 MED ORDER — ROSUVASTATIN CALCIUM 5 MG PO TABS
5.0000 mg | ORAL_TABLET | Freq: Every day | ORAL | Status: DC
  Administered 2021-01-15: 5 mg via ORAL
  Filled 2021-01-14: qty 1

## 2021-01-14 MED ORDER — SODIUM CHLORIDE 0.9% FLUSH
3.0000 mL | Freq: Two times a day (BID) | INTRAVENOUS | Status: DC
  Administered 2021-01-14: 21:00:00 3 mL via INTRAVENOUS

## 2021-01-14 MED ORDER — METFORMIN HCL 500 MG PO TABS
500.0000 mg | ORAL_TABLET | Freq: Every day | ORAL | Status: DC
  Administered 2021-01-15: 500 mg via ORAL
  Filled 2021-01-14: qty 1

## 2021-01-14 MED ORDER — LIDOCAINE 2% (20 MG/ML) 5 ML SYRINGE
INTRAMUSCULAR | Status: AC
  Filled 2021-01-14: qty 5

## 2021-01-14 MED ORDER — MENTHOL 3 MG MT LOZG: 1.0000 | LOZENGE | OROMUCOSAL | Status: DC | PRN

## 2021-01-14 MED ORDER — INSULIN ASPART 100 UNIT/ML IJ SOLN
0.0000 [IU] | Freq: Three times a day (TID) | INTRAMUSCULAR | Status: DC
  Administered 2021-01-15: 3 [IU] via SUBCUTANEOUS

## 2021-01-14 MED ORDER — INSULIN ASPART 100 UNIT/ML IJ SOLN
0.0000 [IU] | Freq: Every day | INTRAMUSCULAR | Status: DC
  Administered 2021-01-14: 21:00:00 3 [IU] via SUBCUTANEOUS

## 2021-01-14 MED ORDER — ROCURONIUM BROMIDE 10 MG/ML (PF) SYRINGE
PREFILLED_SYRINGE | INTRAVENOUS | Status: AC
  Filled 2021-01-14: qty 10

## 2021-01-14 MED ORDER — DEXAMETHASONE SODIUM PHOSPHATE 10 MG/ML IJ SOLN
INTRAMUSCULAR | Status: AC
  Filled 2021-01-14: qty 1

## 2021-01-14 MED ORDER — ACETAMINOPHEN 650 MG RE SUPP: 650.0000 mg | RECTAL | Status: DC | PRN

## 2021-01-14 MED ORDER — DIPHENHYDRAMINE HCL 50 MG/ML IJ SOLN
INTRAMUSCULAR | Status: AC
  Filled 2021-01-14: qty 1

## 2021-01-14 MED ORDER — PROPOFOL 10 MG/ML IV BOLUS
INTRAVENOUS | Status: DC | PRN
  Administered 2021-01-14: 150 mg via INTRAVENOUS

## 2021-01-14 MED ORDER — FENTANYL CITRATE (PF) 100 MCG/2ML IJ SOLN
INTRAMUSCULAR | Status: DC | PRN
  Administered 2021-01-14 (×3): 50 ug via INTRAVENOUS

## 2021-01-14 MED ORDER — CYCLOBENZAPRINE HCL 10 MG PO TABS
10.0000 mg | ORAL_TABLET | Freq: Three times a day (TID) | ORAL | Status: DC | PRN
  Administered 2021-01-14: 21:00:00 10 mg via ORAL
  Filled 2021-01-14: qty 1

## 2021-01-14 MED ORDER — PANTOPRAZOLE SODIUM 40 MG IV SOLR: 40.0000 mg | Freq: Every day | INTRAVENOUS | Status: DC

## 2021-01-14 MED ORDER — DEXAMETHASONE SODIUM PHOSPHATE 10 MG/ML IJ SOLN
INTRAMUSCULAR | Status: DC | PRN
  Administered 2021-01-14: 10 mg via INTRAVENOUS

## 2021-01-14 MED ORDER — CELECOXIB 200 MG PO CAPS
200.0000 mg | ORAL_CAPSULE | Freq: Once | ORAL | Status: AC
  Administered 2021-01-14: 11:00:00 200 mg via ORAL
  Filled 2021-01-14: qty 1

## 2021-01-14 MED ORDER — SUGAMMADEX SODIUM 200 MG/2ML IV SOLN
INTRAVENOUS | Status: DC | PRN
  Administered 2021-01-14: 200 mg via INTRAVENOUS

## 2021-01-14 MED ORDER — HYDROXYZINE HCL 50 MG/ML IM SOLN
50.0000 mg | Freq: Four times a day (QID) | INTRAMUSCULAR | Status: DC | PRN
  Administered 2021-01-14: 17:00:00 50 mg via INTRAMUSCULAR
  Filled 2021-01-14: qty 1

## 2021-01-14 MED ORDER — ONDANSETRON HCL 4 MG PO TABS: 4.0000 mg | ORAL_TABLET | Freq: Four times a day (QID) | ORAL | Status: DC | PRN

## 2021-01-14 MED ORDER — FENTANYL CITRATE (PF) 250 MCG/5ML IJ SOLN
INTRAMUSCULAR | Status: AC
  Filled 2021-01-14: qty 5

## 2021-01-14 MED ORDER — PROPOFOL 500 MG/50ML IV EMUL
INTRAVENOUS | Status: DC | PRN
  Administered 2021-01-14: 150 ug/kg/min via INTRAVENOUS

## 2021-01-14 MED ORDER — BUPIVACAINE LIPOSOME 1.3 % IJ SUSP
INTRAMUSCULAR | Status: AC
  Filled 2021-01-14: qty 20

## 2021-01-14 SURGICAL SUPPLY — 76 items
ADH SKN CLS APL DERMABOND .7 (GAUZE/BANDAGES/DRESSINGS) ×1
APL SKNCLS STERI-STRIP NONHPOA (GAUZE/BANDAGES/DRESSINGS) ×1
BAG COUNTER SPONGE SURGICOUNT (BAG) ×2 IMPLANT
BAG SPNG CNTER NS LX DISP (BAG) ×1
BENZOIN TINCTURE PRP APPL 2/3 (GAUZE/BANDAGES/DRESSINGS) ×2 IMPLANT
BLADE CLIPPER SURG (BLADE) ×1 IMPLANT
BLADE SURG 11 STRL SS (BLADE) ×1 IMPLANT
BONE VIVIGEN FORMABLE 5.4CC (Bone Implant) ×2 IMPLANT
BUR MATCHSTICK NEURO 3.0 LAGG (BURR) ×2 IMPLANT
BUR PRECISION FLUTE 6.0 (BURR) ×2 IMPLANT
CANISTER SUCT 3000ML PPV (MISCELLANEOUS) ×2 IMPLANT
CARTRIDGE OIL MAESTRO DRILL (MISCELLANEOUS) ×1 IMPLANT
CNTNR URN SCR LID CUP LEK RST (MISCELLANEOUS) ×1 IMPLANT
CONT SPEC 4OZ STRL OR WHT (MISCELLANEOUS) ×2
COVER BACK TABLE 24X17X13 BIG (DRAPES) IMPLANT
COVER BACK TABLE 60X90IN (DRAPES) ×2 IMPLANT
DECANTER SPIKE VIAL GLASS SM (MISCELLANEOUS) ×1 IMPLANT
DERMABOND ADVANCED (GAUZE/BANDAGES/DRESSINGS) ×1
DERMABOND ADVANCED .7 DNX12 (GAUZE/BANDAGES/DRESSINGS) ×1 IMPLANT
DIFFUSER DRILL AIR PNEUMATIC (MISCELLANEOUS) ×2 IMPLANT
DRAPE C-ARM 42X72 X-RAY (DRAPES) ×3 IMPLANT
DRAPE HALF SHEET 40X57 (DRAPES) IMPLANT
DRAPE LAPAROTOMY 100X72X124 (DRAPES) ×2 IMPLANT
DRAPE POUCH INSTRU U-SHP 10X18 (DRAPES) ×1 IMPLANT
DRAPE SURG 17X23 STRL (DRAPES) ×1 IMPLANT
DRSG OPSITE POSTOP 4X8 (GAUZE/BANDAGES/DRESSINGS) ×1 IMPLANT
DURAPREP 26ML APPLICATOR (WOUND CARE) ×2 IMPLANT
ELECT BLADE 4.0 EZ CLEAN MEGAD (MISCELLANEOUS) ×2
ELECT REM PT RETURN 9FT ADLT (ELECTROSURGICAL) ×2
ELECTRODE BLDE 4.0 EZ CLN MEGD (MISCELLANEOUS) IMPLANT
ELECTRODE REM PT RTRN 9FT ADLT (ELECTROSURGICAL) ×1 IMPLANT
EVACUATOR 1/8 PVC DRAIN (DRAIN) ×1 IMPLANT
EVACUATOR 3/16  PVC DRAIN (DRAIN)
EVACUATOR 3/16 PVC DRAIN (DRAIN) ×1 IMPLANT
GAUZE 4X4 16PLY ~~LOC~~+RFID DBL (SPONGE) ×1 IMPLANT
GAUZE SPONGE 4X4 12PLY STRL (GAUZE/BANDAGES/DRESSINGS) ×2 IMPLANT
GLOVE EXAM NITRILE XL STR (GLOVE) IMPLANT
GLOVE SURG ENC MOIS LTX SZ7 (GLOVE) ×1 IMPLANT
GLOVE SURG ENC MOIS LTX SZ8 (GLOVE) ×4 IMPLANT
GLOVE SURG UNDER LTX SZ8.5 (GLOVE) ×4 IMPLANT
GLOVE SURG UNDER POLY LF SZ7 (GLOVE) ×1 IMPLANT
GOWN STRL REUS W/ TWL LRG LVL3 (GOWN DISPOSABLE) IMPLANT
GOWN STRL REUS W/ TWL XL LVL3 (GOWN DISPOSABLE) ×2 IMPLANT
GOWN STRL REUS W/TWL 2XL LVL3 (GOWN DISPOSABLE) IMPLANT
GOWN STRL REUS W/TWL LRG LVL3 (GOWN DISPOSABLE) ×8
GOWN STRL REUS W/TWL XL LVL3 (GOWN DISPOSABLE) ×4
GRAFT BN 5X1XSPNE CVD POST DBM (Bone Implant) IMPLANT
GRAFT BNE MATRIX VG FRMBL MD 5 (Bone Implant) IMPLANT
GRAFT BONE MAGNIFUSE 1X5CM (Bone Implant) ×2 IMPLANT
KIT BASIN OR (CUSTOM PROCEDURE TRAY) ×2 IMPLANT
KIT INFUSE SMALL (Orthopedic Implant) ×1 IMPLANT
KIT TURNOVER KIT B (KITS) ×2 IMPLANT
NDL HYPO 21X1.5 SAFETY (NEEDLE) IMPLANT
NDL HYPO 25X1 1.5 SAFETY (NEEDLE) ×1 IMPLANT
NEEDLE HYPO 21X1.5 SAFETY (NEEDLE) ×2 IMPLANT
NEEDLE HYPO 25X1 1.5 SAFETY (NEEDLE) ×2 IMPLANT
NS IRRIG 1000ML POUR BTL (IV SOLUTION) ×2 IMPLANT
OIL CARTRIDGE MAESTRO DRILL (MISCELLANEOUS) ×2
PACK LAMINECTOMY NEURO (CUSTOM PROCEDURE TRAY) ×2 IMPLANT
PAD ARMBOARD 7.5X6 YLW CONV (MISCELLANEOUS) ×6 IMPLANT
RASP 3.0MM (RASP) ×1 IMPLANT
ROD RELINE 5.5X90MM LORDOTIC (Rod) ×1 IMPLANT
ROD RELINE LORD 5.5X80MM (Rod) ×1 IMPLANT
SCREW LOCK RELINE 5.5 TULIP (Screw) ×4 IMPLANT
SPONGE SURGIFOAM ABS GEL 100 (HEMOSTASIS) ×2 IMPLANT
SPONGE T-LAP 4X18 ~~LOC~~+RFID (SPONGE) ×1 IMPLANT
STRIP CLOSURE SKIN 1/2X4 (GAUZE/BANDAGES/DRESSINGS) ×3 IMPLANT
SUT VIC AB 0 CT1 18XCR BRD8 (SUTURE) ×2 IMPLANT
SUT VIC AB 0 CT1 8-18 (SUTURE) ×2
SUT VIC AB 2-0 CT1 18 (SUTURE) ×3 IMPLANT
SUT VICRYL 4-0 PS2 18IN ABS (SUTURE) ×2 IMPLANT
SYR 20ML LL LF (SYRINGE) ×1 IMPLANT
TOWEL GREEN STERILE (TOWEL DISPOSABLE) ×2 IMPLANT
TOWEL GREEN STERILE FF (TOWEL DISPOSABLE) ×2 IMPLANT
TRAY FOLEY MTR SLVR 16FR STAT (SET/KITS/TRAYS/PACK) ×2 IMPLANT
WATER STERILE IRR 1000ML POUR (IV SOLUTION) ×2 IMPLANT

## 2021-01-14 NOTE — H&P (Signed)
Stephen Rowe is an 80 y.o. male.   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  I have extensively gone over the risks and benefits of this operation with him as well as perioperative course expectations of outcome and alternatives to surgery and he understands and agrees to proceed forward.  Past Medical History:  Diagnosis Date   Arthritis    Bulging disc    at L4 and L5   CAD (coronary artery disease)    3 blockages, unchanged in past 15 years, s/p cardiac cath X 2 but no stents   Diabetes mellitus without complication (HCC)    GERD (gastroesophageal reflux disease)    History of kidney stones    Hyperlipidemia    PONV (postoperative nausea and vomiting)    Prostate nodule    Prostatitis    Skin cancer of face     Past Surgical History:  Procedure Laterality Date   ANTERIOR LAT LUMBAR FUSION N/A 06/21/2014   Procedure: ANTERIOR LATERAL LUMBAR FUSION Lumbar two-three, Lumbar three-four ;  Surgeon: Kary Kos, MD;  Location: [facility];  Service: Neurosurgery;  Laterality: N/A;   APPLICATION OF INTRAOPERATIVE CT SCAN N/A 06/24/2014   Procedure: APPLICATION OF INTRAOPERATIVE CT SCAN;  Surgeon: Kary Kos, MD;  Location: [facility];  Service: Neurosurgery;  Laterality: N/A;   CARDIAC CATHETERIZATION     X 2; last 01/17/2003 ([facility]): 50% pLAD, 20% pOM1, 15% ostial LM. NL  LVF. REC: [facility] (Dr. Elonda Husky)   CHOLECYSTECTOMY     COLONOSCOPY  11/18/2011   Procedure: COLONOSCOPY;  Surgeon: Daneil Dolin, MD;  Location: [facility];  Service: Endoscopy;  Laterality: N/A;  8:45   HAND SURGERY     right   JOINT REPLACEMENT Right    TKA   KIDNEY STONE SURGERY     KNEE SURGERY     right   KNEE SURGERY Right    ROTATOR CUFF REPAIR     right   SKIN CANCER EXCISION     left ear    Family History  Problem Relation Age of Onset   Colon cancer Neg Hx    Social History:  reports that he has quit smoking. His smoking use included cigarettes. He has a 4.00 pack-year smoking history. He has never used smokeless tobacco. He reports that he does not drink alcohol and does not use drugs.  Allergies: No Known Allergies  Medications Prior to Admission  Medication Sig Dispense Refill   Camphor-Menthol-Methyl Sal (SALONPAS) 3.02-07-08 % PTCH Place 1 patch onto the skin daily as needed (pain).     ELIQUIS 5 MG TABS tablet Take 5 mg by mouth in the morning and at bedtime.     metFORMIN (GLUCOPHAGE) 500 MG tablet Take 500 mg by mouth daily.     metoprolol succinate (TOPROL-XL) 25 MG 24 hr tablet Take 25 mg by mouth daily.  rosuvastatin (CRESTOR) 5 MG tablet Take 5 mg by mouth daily.     HYDROmorphone (DILAUDID) 2 MG tablet Take 1 tablet (2 mg total) by mouth every 4 (four) hours as needed for severe pain. (Patient not taking: Reported on 01/07/2021) 60 tablet 0   oxyCODONE-acetaminophen (PERCOCET/ROXICET) 5-325 MG per tablet Take 1 tablet by mouth every 4 (four) hours as needed for severe pain. (Patient not taking: Reported on 01/07/2021) 20 tablet 0    Results for orders placed or performed during the hospital encounter of 01/14/21 (from the past 48 hour(s))  Glucose, capillary     Status: Abnormal   Collection Time: 01/14/21  9:02 AM  Result Value Ref Range   Glucose-Capillary 129 (H) 70 - 99 mg/dL    Comment: Glucose reference range applies only to samples taken  after fasting for at least 8 hours.   No results found.  Review of Systems  Musculoskeletal:  Positive for back pain.   Blood pressure 136/71, pulse 72, temperature 98.5 F (36.9 C), temperature source Oral, resp. rate 18, height 5\' 11"  (1.803 m), weight 91.2 kg, SpO2 94 %. Physical Exam HENT:     Head: Normocephalic.     Right Ear: Tympanic membrane normal.     Nose: Nose normal.     Mouth/Throat:     Mouth: Mucous membranes are moist.  Eyes:     Pupils: Pupils are equal, round, and reactive to light.  Cardiovascular:     Rate and Rhythm: Normal rate.  Pulmonary:     Effort: Pulmonary effort is normal.  Abdominal:     General: Abdomen is flat.  Musculoskeletal:        General: Normal range of motion.     Cervical back: Normal range of motion.  Skin:    General: Skin is warm.  Neurological:     Mental Status: He is alert.     Comments: Strength is 5 out of 5 iliopsoas, quads, hamstrings, gastrocs, into tibialis, and EHL.     Assessment/Plan 80 year old presents for revision of fusion for pseudoarthrosis  Elaina Hoops, MD 01/14/2021, 10:21 AM

## 2021-01-14 NOTE — Op Note (Signed)
Preoperative diagnosis: Pseudoarthrosis L5-S1 with loose L5-S1 and iliac screws on the left and broken rod on the right  Postoperative diagnosis: Same  Procedure: #1 exploration fusion removal of hardware with cutting the rod at L3-4 on the right and replacement of new rods from L4 down to the iliac connector on the right.  Removal of rod and left-sided L5, S1 and iliac screws along with connector  2.  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  I extensively went over the risks and benefits of that operation with him as well as perioperative course expectations of outcome and alternatives to surgery and he understood and agreed to proceed forward.  Operative procedure: Patient was brought into the OR was [facility] general anesthesia positioned prone on the Wilson frame his back was prepped and draped in routine sterile fashion.  His inferior aspect of his old incision was opened up after infiltration of 10 cc lidocaine with epi and the scar tissue was dissected free and identified the hardware from the iliac connector all the way up to the L3 screw in the right L4 screw on the left.  I dissected out the iliac screw on the left I then cut the rod at L4-5 on the left and L3-4 on  the right remove the rod on the left and removal of left-sided L5, S1, iliac connector and iliac screw in the left.  There are screws on the right were solid so I placed a new rod from L4 to the iliac connector on the right anchored everything in place.  I then dissected out the TP at L5 on the left as well as the facet joint and sacral ala.  I then extensively packed the autograft mix along with the vision and BMP and Magnifuse posterior laterally from L5 down to the sacral ala.  I then placed a medium Hemovac drain injected Exparel in the fascia and closed the wound in layers with interrupted Vicryl and a running 4 subcuticular.  Dermabond benzoin Steri-Strips and a sterile dressing was applied patient recovery in stable condition.  At the end the case all needle counts and sponge counts were correct.

## 2021-01-14 NOTE — Anesthesia Postprocedure Evaluation (Signed)
Anesthesia Post Note  Patient: Stephen Rowe  Procedure(s) Performed: Posterior lateral fusion - Lumbar five-Sacral one exploration fusion with removal  of hardware Lumbar Five to the ilium with removal of screws (Back)     Patient location during evaluation: PACU Anesthesia Type: General Level of consciousness: awake and alert Pain management: pain level controlled Vital Signs Assessment: post-procedure vital signs reviewed and stable Respiratory status: spontaneous breathing, nonlabored ventilation, respiratory function stable and patient connected to nasal cannula oxygen Cardiovascular status: blood pressure returned to baseline and stable Postop Assessment: no apparent nausea or vomiting Anesthetic complications: no   No notable events documented.  Last Vitals:  Vitals:   01/14/21 1438 01/14/21 1451  BP: 107/75 115/77  Pulse: 68 65  Resp:  18  Temp:  36.6 C  SpO2:  95%    Last Pain:  Vitals:   01/14/21 1451  TempSrc: Oral  PainSc:                  Stephen Rowe Stephen Rowe

## 2021-01-14 NOTE — Transfer of Care (Signed)
Immediate Anesthesia Transfer of Care Note  Patient: Stephen Rowe  Procedure(s) Performed: Posterior lateral fusion - Lumbar five-Sacral one exploration fusion with removal  of hardware Lumbar Five to the ilium with removal of screws (Back)  Patient Location: PACU  Anesthesia Type:General  Level of Consciousness: drowsy and patient cooperative  Airway & Oxygen Therapy: Patient Spontanous Breathing  Post-op Assessment: Report given to RN and Post -op Vital signs reviewed and stable  Post vital signs: Reviewed and stable  Last Vitals:  Vitals Value Taken Time  BP 133/77 01/14/21 1323  Temp    Pulse 70 01/14/21 1327  Resp 14 01/14/21 1327  SpO2 94 % 01/14/21 1327  Vitals shown include unvalidated device data.  Last Pain:  Vitals:   01/14/21 0936  TempSrc:   PainSc: 2       Patients Stated Pain Goal: 3 (89/57/02 2026)  Complications: No notable events documented.

## 2021-01-14 NOTE — Anesthesia Procedure Notes (Signed)
Procedure Name: Intubation Date/Time: 01/14/2021 11:20 AM Performed by: Gwyndolyn Saxon, CRNA Pre-anesthesia Checklist: Patient identified, Emergency Drugs available, Suction available and Patient being monitored Patient Re-evaluated:Patient Re-evaluated prior to induction Oxygen Delivery Method: Circle system utilized Preoxygenation: Pre-oxygenation with 100% oxygen Induction Type: IV induction Ventilation: Mask ventilation without difficulty Laryngoscope Size: Nims and 2 Grade View: Grade I Tube type: Oral Tube size: 7.5 mm Number of attempts: 1 Airway Equipment and Method: Stylet Placement Confirmation: ETT inserted through vocal cords under direct vision, positive ETCO2 and breath sounds checked- equal and bilateral Secured at: 23 cm Tube secured with: Tape Dental Injury: Teeth and Oropharynx as per pre-operative assessment

## 2021-01-15 ENCOUNTER — Encounter (HOSPITAL_COMMUNITY): Payer: Self-pay | Admitting: Neurosurgery

## 2021-01-15 LAB — GLUCOSE, CAPILLARY
Glucose-Capillary: 278 mg/dL — ABNORMAL HIGH (ref 70–99)
Glucose-Capillary: 172 mg/dL — ABNORMAL HIGH (ref 70–99)

## 2021-01-15 MED ORDER — OXYCODONE HCL 5 MG PO TABS
5.0000 mg | ORAL_TABLET | ORAL | Status: DC | PRN
  Administered 2021-01-15 (×2): 5 mg via ORAL
  Filled 2021-01-15: qty 1

## 2021-01-15 MED ORDER — METHOCARBAMOL 500 MG PO TABS: 500.0000 mg | ORAL_TABLET | Freq: Four times a day (QID) | ORAL | 0 refills | Status: DC

## 2021-01-15 MED ORDER — HYDROCODONE-ACETAMINOPHEN 5-325 MG PO TABS: 1.0000 | ORAL_TABLET | ORAL | 0 refills | Status: AC | PRN

## 2021-01-15 NOTE — Evaluation (Signed)
Occupational Therapy Evaluation Patient Details Name: Stephen Rowe MRN: [id number] DOB: Jun 09, 1940 Today's Date: 01/15/2021   History of Present Illness 80 yo male s/p L5-S1 fusion on 12/14. PMH including arthritis, CAD, DM, lumbar fusion (2016), R TKA, and R rotator cuff repair.   Clinical Impression   PTA, pt was living with his wife and was independent. Currently, pt performing ADLs and functional mobility at Mod I level with increased time as needed. Provided education and handout on back precautions, brace management, bed mobility, grooming, LB ADLs, toileting, and tub transfer; pt demonstrated understanding. Answered all pt questions. Recommend dc home once medically stable per physician. All acute OT needs met and will sign off. Thank you.      Recommendations for follow up therapy are one component of a multi-disciplinary discharge planning process, led by the attending physician.  Recommendations may be updated based on patient status, additional functional criteria and insurance authorization.   Follow Up Recommendations  No OT follow up    Assistance Recommended at Discharge PRN  Functional Status Assessment  Patient has had a recent decline in their functional status and demonstrates the ability to make significant improvements in function in a reasonable and predictable amount of time.  Equipment Recommendations  None recommended by OT    Recommendations for Other Services       Precautions / Restrictions Precautions Precautions: Back Precaution Booklet Issued: Yes (comment) Precaution Comments: Reviewed back rpecautions Required Braces or Orthoses: Spinal Brace Spinal Brace: Lumbar corset;Applied in sitting position      Mobility Bed Mobility Overal bed mobility: Independent             General bed mobility comments: Using log roll technique    Transfers Overall transfer level: Independent                        Balance Overall balance  assessment: No apparent balance deficits (not formally assessed)                                         ADL either performed or assessed with clinical judgement   ADL Overall ADL's : Modified independent                                       General ADL Comments: Providing educatongh     Vision Baseline Vision/History: 1 Wears glasses       Perception     Praxis      Pertinent Vitals/Pain       Hand Dominance Right   Extremity/Trunk Assessment Upper Extremity Assessment Upper Extremity Assessment: Overall WFL for tasks assessed   Lower Extremity Assessment Lower Extremity Assessment: Overall WFL for tasks assessed   Cervical / Trunk Assessment Cervical / Trunk Assessment: Back Surgery   Communication Communication Communication: No difficulties   Cognition Arousal/Alertness: Awake/alert Behavior During Therapy: WFL for tasks assessed/performed Overall Cognitive Status: Within Functional Limits for tasks assessed                                       General Comments  Recent fall - pt tripping on something in garage in October. Otherwise, reports no balance issues  Exercises     Shoulder Instructions      Home Living Family/patient expects to be discharged to:: Private residence Living Arrangements: Spouse/significant other Available Help at Discharge: Family;Available 24 hours/day Type of Home: House Home Access: Stairs to enter [employer] of Steps: 2   Home Layout: One level     Bathroom Shower/Tub: Teacher, early years/pre: Standard     Home Equipment: Conservation officer, nature (2 wheels)   Additional Comments: Wife jsut had sx, so daughters will be coming to stay at dc as well      Prior Functioning/Environment Prior Level of Function : Independent/Modified Independent                        OT Problem List: Decreased strength;Decreased range of motion;Decreased  activity tolerance;Impaired balance (sitting and/or standing);Decreased knowledge of use of DME or AE;Decreased knowledge of precautions;Pain      OT Treatment/Interventions:      OT Goals(Current goals can be found in the care plan section) Acute Rehab OT Goals Patient Stated Goal: Go home OT Goal Formulation: All assessment and education complete, DC therapy  OT Frequency:     Barriers to D/C:            Co-evaluation              AM-PAC OT "6 Clicks" Daily Activity     Outcome Measure Help from another person eating meals?: None Help from another person taking care of personal grooming?: None Help from another person toileting, which includes using toliet, bedpan, or urinal?: None Help from another person bathing (including washing, rinsing, drying)?: None Help from another person to put on and taking off regular upper body clothing?: None Help from another person to put on and taking off regular lower body clothing?: None 6 Click Score: 24   End of Session Equipment Utilized During Treatment: Back brace Nurse Communication: Mobility status  Activity Tolerance: Patient tolerated treatment well Patient left: in bed;with call bell/phone within reach  OT Visit Diagnosis: Muscle weakness (generalized) (M62.81);Pain;History of falling (Z91.81) Pain - part of body:  (back)                Time: 3154-0086 OT Time Calculation (min): 15 min Charges:  OT General Charges $OT Visit: 1 Visit OT Evaluation $OT Eval Low Complexity: [place], OTR/L Acute Rehab Pager: [phone] Office: [place] 01/15/2021, 8:37 AM

## 2021-01-15 NOTE — Progress Notes (Signed)
Pt doing well. Pt and daughter given D/C instructions with verbal understanding. Rx's were sent to the pharmacy by MD. Pt's incision is clean and dry with no sign of infection. Pt's IV and Hemovac were removed prior to D/C. Pt D/C'd home via wheelchair per MD order. Pt is stable @ D/C and has no other needs at this time. Holli Humbles, RN

## 2021-01-15 NOTE — Discharge Instructions (Signed)

## 2021-01-15 NOTE — Evaluation (Signed)
Physical Therapy Evaluation Patient Details Name: Stephen Rowe MRN: [id number] DOB: May 11, 1940 Today's Date: 01/15/2021  History of Present Illness  80 yo male presents to [facility] hospital 01/14/2021, underwent L5-S1 fusion on 12/14. PMH including arthritis, CAD, DM, lumbar fusion (2016), R TKA, and R rotator cuff repair.  Clinical Impression  Pt presents to PT with no significant functional deficits compared to baseline. Pt is able to ambulate and negotiate stairs at a modI or independent level. Pt with reduced gait speed however not far from baseline per patient report. Pt reports good knowledge of back precautions and is able to manage brace independently. Pt is encouraged to frequently mobilize upon return home. Pt has no further acute PT needs.     Recommendations for follow up therapy are one component of a multi-disciplinary discharge planning process, led by the attending physician.  Recommendations may be updated based on patient status, additional functional criteria and insurance authorization.  Follow Up Recommendations No PT follow up    Assistance Recommended at Discharge None  Functional Status Assessment Patient has not had a recent decline in their functional status  Equipment Recommendations  None recommended by PT    Recommendations for Other Services       Precautions / Restrictions Precautions Precautions: Back Precaution Booklet Issued: Yes (comment) Precaution Comments: Reviewed back rpecautions Required Braces or Orthoses: Spinal Brace Spinal Brace: Lumbar corset;Applied in sitting position Restrictions Weight Bearing Restrictions: No      Mobility  Bed Mobility Overal bed mobility: Modified Independent             General bed mobility comments: log roll technique, increased time    Transfers Overall transfer level: Independent                      Ambulation/Gait Ambulation/Gait assistance: Independent Gait Distance (Feet): 350  Feet Assistive device:  (PRN railing <25% of distance) Gait Pattern/deviations: Step-through pattern Gait velocity: functional Gait velocity interpretation: 1.31 - 2.62 ft/sec, indicative of limited community ambulator   General Gait Details: pt with slowed but steady step-through gait  Stairs Stairs: Yes Stairs assistance: Modified independent (Device/Increase time) Stair Management: One rail Right;Step to pattern Number of Stairs: 6    Wheelchair Mobility    Modified Rankin (Stroke Patients Only)       Balance Overall balance assessment: No apparent balance deficits (not formally assessed)                                           Pertinent Vitals/Pain Pain Assessment: Faces Faces Pain Scale: Hurts a little bit Pain Location: low back Pain Descriptors / Indicators: Grimacing Pain Intervention(s): Monitored during session    Home Living Family/patient expects to be discharged to:: Private residence Living Arrangements: Spouse/significant other Available Help at Discharge: Family;Available 24 hours/day Type of Home: House Home Access: Stairs to enter Entrance Stairs-Rails: None Entrance Stairs-Number of Steps: 1   Home Layout: One level Home Equipment: Conservation officer, nature (2 wheels);Cane - single point;BSC/3in1 Additional Comments: Wife jsut had sx, so daughters will be coming to stay at dc as well    Prior Function Prior Level of Function : Independent/Modified Independent             Mobility Comments: independent in mobility, works on cars as hobby       Journalist, newspaper   Dominant Hand: Right  Extremity/Trunk Assessment   Upper Extremity Assessment Upper Extremity Assessment: Overall WFL for tasks assessed    Lower Extremity Assessment Lower Extremity Assessment: Overall WFL for tasks assessed    Cervical / Trunk Assessment Cervical / Trunk Assessment: Back Surgery  Communication   Communication: No difficulties  Cognition  Arousal/Alertness: Awake/alert Behavior During Therapy: WFL for tasks assessed/performed Overall Cognitive Status: Within Functional Limits for tasks assessed                                          General Comments General comments (skin integrity, edema, etc.): pt reports tripping over lawn mower in garage, states he likely needs to organize and de-clutter garage    Exercises     Assessment/Plan    PT Assessment Patient does not need any further PT services  PT Problem List         PT Treatment Interventions      PT Goals (Current goals can be found in the Care Plan section)       Frequency     Barriers to discharge        Co-evaluation               AM-PAC PT "6 Clicks" Mobility  Outcome Measure Help needed turning from your back to your side while in a flat bed without using bedrails?: None Help needed moving from lying on your back to sitting on the side of a flat bed without using bedrails?: None Help needed moving to and from a bed to a chair (including a wheelchair)?: None Help needed standing up from a chair using your arms (e.g., wheelchair or bedside chair)?: None Help needed to walk in hospital room?: None Help needed climbing 3-5 steps with a railing? : None 6 Click Score: 24    End of Session Equipment Utilized During Treatment: Back brace Activity Tolerance: Patient tolerated treatment well Patient left: in bed;with call bell/phone within reach Nurse Communication: Mobility status PT Visit Diagnosis: Other abnormalities of gait and mobility (R26.89)    Time: 7096-2836 PT Time Calculation (min) (ACUTE ONLY): 17 min   Charges:   PT Evaluation $PT Eval Low Complexity: 1 Low          Zenaida Niece, PT, DPT Acute Rehabilitation Pager: [phone] Office [phone]   Zenaida Niece 01/15/2021, 9:14 AM

## 2021-01-15 NOTE — Discharge Summary (Signed)
Physician Discharge Summary  Patient ID: Stephen Rowe MRN: [id number] DOB/AGE: 1940-09-26 80 y.o.  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 The patient tolerated the procedure well and was taken to the recovery room and then to the floor in stable condition. The hospital course was routine. There were no complications. The wound remained clean dry and intact. Pt had appropriate back soreness. No complaints of leg pain or new N/T/W. The patient remained afebrile with stable vital signs, and tolerated a regular diet. The patient continued to increase activities, and pain was well controlled with oral pain medications.   Consults: None  Significant Diagnostic Studies:  Results for orders placed or performed during the hospital encounter of 01/14/21  Glucose, capillary  Result Value Ref Range   Glucose-Capillary 129 (H) 70 - 99 mg/dL  Glucose, capillary  Result Value Ref Range   Glucose-Capillary 139 (H) 70 - 99 mg/dL  Glucose, capillary  Result Value Ref Range   Glucose-Capillary 163 (H) 70 - 99 mg/dL  Glucose, capillary  Result Value Ref Range   Glucose-Capillary 278 (H) 70 - 99 mg/dL   Comment 1 Notify RN    Comment 2 Document in Chart   Glucose, capillary  Result Value Ref Range   Glucose-Capillary 172 (H) 70 - 99 mg/dL   Comment 1 Notify RN    Comment 2 Document in Chart     No results found.  Antibiotics:  Anti-infectives (From admission, onward)    Start     Dose/Rate Route Frequency Ordered Stop   01/14/21 1900  ceFAZolin (ANCEF) IVPB 2g/100 mL premix        2 g 200 mL/hr over 30 Minutes Intravenous Every 8  hours 01/14/21 1444 01/16/21 1859   01/14/21 0900  ceFAZolin (ANCEF) IVPB 2g/100 mL premix        2 g 200 mL/hr over 30 Minutes Intravenous On call to O.R. 01/14/21 0847 01/14/21 1150       Discharge Exam: Blood pressure 111/72, pulse 81, temperature 98.1 F (36.7 C), temperature source Oral, resp. rate 20, height 5\' 11"  (1.803 m), weight 91.2 kg, SpO2 95 %. Neurologic: Grossly normal Ambulating and voiding well, incision cdi   Discharge Medications:   Allergies as of 01/15/2021   No Known Allergies      Medication List     STOP taking these medications    Eliquis 5 MG Tabs tablet Generic drug: apixaban       TAKE these medications    HYDROcodone-acetaminophen 5-325 MG tablet Commonly known as: NORCO/VICODIN Take 1 tablet by mouth every 4 (four) hours as needed for moderate pain.   HYDROmorphone 2 MG tablet Commonly known as: DILAUDID Take 1 tablet (2 mg total) by mouth every 4 (four) hours as needed for severe pain.   metFORMIN 500 MG tablet Commonly known as: GLUCOPHAGE Take 500 mg by mouth daily.   methocarbamol 500 MG tablet Commonly known as: Robaxin Take 1 tablet (500 mg total) by mouth 4 (four) times daily.   metoprolol succinate 25 MG 24 hr tablet Commonly known as: TOPROL-XL Take 25 mg by mouth daily.   oxyCODONE-acetaminophen 5-325 MG tablet Commonly known as:  PERCOCET/ROXICET Take 1 tablet by mouth every 4 (four) hours as needed for severe pain.   rosuvastatin 5 MG tablet Commonly known as: CRESTOR Take 5 mg by mouth daily.   Salonpas 3.02-07-08 % Ptch Generic drug: Camphor-Menthol-Methyl Sal Place 1 patch onto the skin daily as needed (pain).        Disposition: home   Final Dx: revision of hardware and removal of hardware L4-ilium.  Discharge Instructions      Remove dressing in 72 hours   Complete by: As directed    Call MD for:  difficulty breathing, headache or visual disturbances   Complete by: As directed    Call MD for:   hives   Complete by: As directed    Call MD for:  persistant dizziness or light-headedness   Complete by: As directed    Call MD for:  persistant nausea and vomiting   Complete by: As directed    Call MD for:  redness, tenderness, or signs of infection (pain, swelling, redness, odor or green/yellow discharge around incision site)   Complete by: As directed    Call MD for:  severe uncontrolled pain   Complete by: As directed    Call MD for:  temperature >100.4   Complete by: As directed    Diet - low sodium heart healthy   Complete by: As directed    Driving Restrictions   Complete by: As directed    No driving for 2 weeks, no riding in the car for 1 week   Increase activity slowly   Complete by: As directed           Signed: Ocie Cornfield Lavonne Cass 01/15/2021, 7:59 AM

## 2021-02-24 DIAGNOSIS — M4310 Spondylolisthesis, site unspecified: Secondary | ICD-10-CM | POA: Diagnosis not present

## 2021-04-14 DIAGNOSIS — M1991 Primary osteoarthritis, unspecified site: Secondary | ICD-10-CM | POA: Diagnosis not present

## 2021-04-14 DIAGNOSIS — E118 Type 2 diabetes mellitus with unspecified complications: Secondary | ICD-10-CM | POA: Diagnosis not present

## 2021-04-14 DIAGNOSIS — E782 Mixed hyperlipidemia: Secondary | ICD-10-CM | POA: Diagnosis not present

## 2021-04-14 DIAGNOSIS — M48062 Spinal stenosis, lumbar region with neurogenic claudication: Secondary | ICD-10-CM | POA: Diagnosis not present

## 2021-04-14 DIAGNOSIS — Z0001 Encounter for general adult medical examination with abnormal findings: Secondary | ICD-10-CM | POA: Diagnosis not present

## 2021-04-14 DIAGNOSIS — E7849 Other hyperlipidemia: Secondary | ICD-10-CM | POA: Diagnosis not present

## 2021-04-14 DIAGNOSIS — I10 Essential (primary) hypertension: Secondary | ICD-10-CM | POA: Diagnosis not present

## 2021-04-16 DIAGNOSIS — M4310 Spondylolisthesis, site unspecified: Secondary | ICD-10-CM | POA: Diagnosis not present

## 2021-04-20 DIAGNOSIS — M5451 Vertebrogenic low back pain: Secondary | ICD-10-CM | POA: Diagnosis not present

## 2021-04-20 DIAGNOSIS — M4120 Other idiopathic scoliosis, site unspecified: Secondary | ICD-10-CM | POA: Diagnosis not present

## 2021-04-23 DIAGNOSIS — M4120 Other idiopathic scoliosis, site unspecified: Secondary | ICD-10-CM | POA: Diagnosis not present

## 2021-04-23 DIAGNOSIS — M5451 Vertebrogenic low back pain: Secondary | ICD-10-CM | POA: Diagnosis not present

## 2021-04-28 DIAGNOSIS — M4120 Other idiopathic scoliosis, site unspecified: Secondary | ICD-10-CM | POA: Diagnosis not present

## 2021-04-28 DIAGNOSIS — M5451 Vertebrogenic low back pain: Secondary | ICD-10-CM | POA: Diagnosis not present

## 2021-05-01 DIAGNOSIS — M5451 Vertebrogenic low back pain: Secondary | ICD-10-CM | POA: Diagnosis not present

## 2021-05-01 DIAGNOSIS — M4120 Other idiopathic scoliosis, site unspecified: Secondary | ICD-10-CM | POA: Diagnosis not present

## 2021-05-08 DIAGNOSIS — M5451 Vertebrogenic low back pain: Secondary | ICD-10-CM | POA: Diagnosis not present

## 2021-05-08 DIAGNOSIS — M4120 Other idiopathic scoliosis, site unspecified: Secondary | ICD-10-CM | POA: Diagnosis not present

## 2021-05-27 DIAGNOSIS — M4120 Other idiopathic scoliosis, site unspecified: Secondary | ICD-10-CM | POA: Diagnosis not present

## 2021-05-27 DIAGNOSIS — M5451 Vertebrogenic low back pain: Secondary | ICD-10-CM | POA: Diagnosis not present

## 2021-06-25 ENCOUNTER — Other Ambulatory Visit: Payer: Self-pay | Admitting: Neurosurgery

## 2021-06-25 DIAGNOSIS — S32009K Unspecified fracture of unspecified lumbar vertebra, subsequent encounter for fracture with nonunion: Secondary | ICD-10-CM

## 2021-06-30 ENCOUNTER — Ambulatory Visit
Admission: RE | Admit: 2021-06-30 | Discharge: 2021-06-30 | Disposition: A | Discharge status: Home / Self Care | Payer: Medicare Other | Source: Ambulatory Visit | Attending: Neurosurgery | Admitting: Neurosurgery

## 2021-06-30 DIAGNOSIS — M4126 Other idiopathic scoliosis, lumbar region: Secondary | ICD-10-CM | POA: Diagnosis not present

## 2021-06-30 DIAGNOSIS — M8588 Other specified disorders of bone density and structure, other site: Secondary | ICD-10-CM | POA: Diagnosis not present

## 2021-06-30 DIAGNOSIS — S32009K Unspecified fracture of unspecified lumbar vertebra, subsequent encounter for fracture with nonunion: Secondary | ICD-10-CM

## 2021-07-21 DIAGNOSIS — S32009K Unspecified fracture of unspecified lumbar vertebra, subsequent encounter for fracture with nonunion: Secondary | ICD-10-CM | POA: Diagnosis not present

## 2021-08-05 DIAGNOSIS — H6992 Unspecified Eustachian tube disorder, left ear: Secondary | ICD-10-CM | POA: Diagnosis not present

## 2021-08-05 DIAGNOSIS — E118 Type 2 diabetes mellitus with unspecified complications: Secondary | ICD-10-CM | POA: Diagnosis not present

## 2021-08-05 DIAGNOSIS — I10 Essential (primary) hypertension: Secondary | ICD-10-CM | POA: Diagnosis not present

## 2021-09-29 DIAGNOSIS — C44222 Squamous cell carcinoma of skin of right ear and external auricular canal: Secondary | ICD-10-CM | POA: Diagnosis not present

## 2021-09-29 DIAGNOSIS — D3701 Neoplasm of uncertain behavior of lip: Secondary | ICD-10-CM | POA: Diagnosis not present

## 2021-09-29 DIAGNOSIS — C4402 Squamous cell carcinoma of skin of lip: Secondary | ICD-10-CM | POA: Diagnosis not present

## 2021-09-29 DIAGNOSIS — D485 Neoplasm of uncertain behavior of skin: Secondary | ICD-10-CM | POA: Diagnosis not present

## 2021-10-08 ENCOUNTER — Encounter: Payer: Self-pay | Admitting: *Deleted

## 2021-10-08 ENCOUNTER — Telehealth: Payer: Self-pay | Admitting: *Deleted

## 2021-10-08 NOTE — Patient Outreach (Signed)
  Care Coordination   Initial Visit Note   10/08/2021 Name: Stephen Rowe MRN: [id number] DOB: [birth year]  Stephen Rowe is a 81 y.o. year old male who sees Redmond School, MD for primary care. I spoke with  Lindie Spruce by phone today.  What matters to the patients health and wellness today?  Ongoing self management of chronic medical conditions     Goals Addressed             This Visit's Progress    Care Coordination Services (no follow up required)       Care Coordination Interventions: Reviewed medications with patient and discussed affordability Assessed social determinant of health barriers Assessed mobility and ability to perform ADLs Assessed family/Social support Provided patient/caregiver with verbal information on [facility] [phone]) Encouraged patient to request a referral for [facility] from PCP if services are needed in the future         SDOH assessments and interventions completed:  Yes  SDOH Interventions Today    Flowsheet Row Most Recent Value  SDOH Interventions   Food Insecurity Interventions Intervention Not Indicated  Housing Interventions Intervention Not Indicated  Transportation Interventions Intervention Not Indicated  Financial Strain Interventions Intervention Not Indicated        Care Coordination Interventions Activated:  Yes  Care Coordination Interventions:  Yes, provided   Follow up plan: No further intervention required.   Encounter Outcome:  Pt. Visit Completed   Chong Sicilian, BSN, RN-BC [place] [facility] Pharmacist, community Dial: [phone]

## 2021-10-15 DIAGNOSIS — D0001 Carcinoma in situ of labial mucosa and vermilion border: Secondary | ICD-10-CM | POA: Diagnosis not present

## 2021-10-15 DIAGNOSIS — D040 Carcinoma in situ of skin of lip: Secondary | ICD-10-CM | POA: Diagnosis not present

## 2021-10-20 DIAGNOSIS — M4310 Spondylolisthesis, site unspecified: Secondary | ICD-10-CM | POA: Diagnosis not present

## 2021-10-26 DIAGNOSIS — M5459 Other low back pain: Secondary | ICD-10-CM | POA: Diagnosis not present

## 2021-10-26 DIAGNOSIS — M6281 Muscle weakness (generalized): Secondary | ICD-10-CM | POA: Diagnosis not present

## 2021-11-17 DIAGNOSIS — C44222 Squamous cell carcinoma of skin of right ear and external auricular canal: Secondary | ICD-10-CM | POA: Diagnosis not present

## 2021-11-17 DIAGNOSIS — L578 Other skin changes due to chronic exposure to nonionizing radiation: Secondary | ICD-10-CM | POA: Diagnosis not present

## 2021-11-17 DIAGNOSIS — L570 Actinic keratosis: Secondary | ICD-10-CM | POA: Diagnosis not present

## 2021-12-22 DIAGNOSIS — M5440 Lumbago with sciatica, unspecified side: Secondary | ICD-10-CM | POA: Diagnosis not present

## 2022-01-01 DIAGNOSIS — I2510 Atherosclerotic heart disease of native coronary artery without angina pectoris: Secondary | ICD-10-CM | POA: Diagnosis not present

## 2022-01-01 DIAGNOSIS — I483 Typical atrial flutter: Secondary | ICD-10-CM | POA: Diagnosis not present

## 2022-01-01 DIAGNOSIS — I484 Atypical atrial flutter: Secondary | ICD-10-CM | POA: Diagnosis not present

## 2022-01-01 DIAGNOSIS — E789 Disorder of lipoprotein metabolism, unspecified: Secondary | ICD-10-CM | POA: Diagnosis not present

## 2022-02-17 DIAGNOSIS — L570 Actinic keratosis: Secondary | ICD-10-CM | POA: Diagnosis not present

## 2022-02-17 DIAGNOSIS — Z85828 Personal history of other malignant neoplasm of skin: Secondary | ICD-10-CM | POA: Diagnosis not present

## 2022-02-17 DIAGNOSIS — Z08 Encounter for follow-up examination after completed treatment for malignant neoplasm: Secondary | ICD-10-CM | POA: Diagnosis not present

## 2022-02-17 DIAGNOSIS — L905 Scar conditions and fibrosis of skin: Secondary | ICD-10-CM | POA: Diagnosis not present

## 2022-02-17 DIAGNOSIS — L578 Other skin changes due to chronic exposure to nonionizing radiation: Secondary | ICD-10-CM | POA: Diagnosis not present

## 2022-02-17 DIAGNOSIS — C4492 Squamous cell carcinoma of skin, unspecified: Secondary | ICD-10-CM | POA: Diagnosis not present

## 2022-03-18 DIAGNOSIS — I10 Essential (primary) hypertension: Secondary | ICD-10-CM | POA: Diagnosis not present

## 2022-03-18 DIAGNOSIS — H6120 Impacted cerumen, unspecified ear: Secondary | ICD-10-CM | POA: Diagnosis not present

## 2022-04-12 DIAGNOSIS — H524 Presbyopia: Secondary | ICD-10-CM | POA: Diagnosis not present

## 2022-04-12 DIAGNOSIS — E119 Type 2 diabetes mellitus without complications: Secondary | ICD-10-CM | POA: Diagnosis not present

## 2022-04-12 DIAGNOSIS — H25813 Combined forms of age-related cataract, bilateral: Secondary | ICD-10-CM | POA: Diagnosis not present

## 2022-04-12 DIAGNOSIS — H52203 Unspecified astigmatism, bilateral: Secondary | ICD-10-CM | POA: Diagnosis not present

## 2022-04-21 DIAGNOSIS — H25012 Cortical age-related cataract, left eye: Secondary | ICD-10-CM | POA: Diagnosis not present

## 2022-04-21 DIAGNOSIS — H25011 Cortical age-related cataract, right eye: Secondary | ICD-10-CM | POA: Diagnosis not present

## 2022-04-21 DIAGNOSIS — H2512 Age-related nuclear cataract, left eye: Secondary | ICD-10-CM | POA: Diagnosis not present

## 2022-04-21 DIAGNOSIS — Z961 Presence of intraocular lens: Secondary | ICD-10-CM | POA: Diagnosis not present

## 2022-04-21 DIAGNOSIS — H2511 Age-related nuclear cataract, right eye: Secondary | ICD-10-CM | POA: Diagnosis not present

## 2022-04-28 DIAGNOSIS — Z961 Presence of intraocular lens: Secondary | ICD-10-CM | POA: Diagnosis not present

## 2022-04-28 DIAGNOSIS — H25012 Cortical age-related cataract, left eye: Secondary | ICD-10-CM | POA: Diagnosis not present

## 2022-04-28 DIAGNOSIS — H2512 Age-related nuclear cataract, left eye: Secondary | ICD-10-CM | POA: Diagnosis not present

## 2022-05-10 DIAGNOSIS — M48062 Spinal stenosis, lumbar region with neurogenic claudication: Secondary | ICD-10-CM | POA: Diagnosis not present

## 2022-05-10 DIAGNOSIS — E1159 Type 2 diabetes mellitus with other circulatory complications: Secondary | ICD-10-CM | POA: Diagnosis not present

## 2022-05-10 DIAGNOSIS — I2510 Atherosclerotic heart disease of native coronary artery without angina pectoris: Secondary | ICD-10-CM | POA: Diagnosis not present

## 2022-05-10 DIAGNOSIS — M1991 Primary osteoarthritis, unspecified site: Secondary | ICD-10-CM | POA: Diagnosis not present

## 2022-05-10 DIAGNOSIS — I10 Essential (primary) hypertension: Secondary | ICD-10-CM | POA: Diagnosis not present

## 2022-05-10 DIAGNOSIS — I4891 Unspecified atrial fibrillation: Secondary | ICD-10-CM | POA: Diagnosis not present

## 2022-05-10 DIAGNOSIS — Z0001 Encounter for general adult medical examination with abnormal findings: Secondary | ICD-10-CM | POA: Diagnosis not present

## 2022-05-12 DIAGNOSIS — E118 Type 2 diabetes mellitus with unspecified complications: Secondary | ICD-10-CM | POA: Diagnosis not present

## 2022-05-12 DIAGNOSIS — D518 Other vitamin B12 deficiency anemias: Secondary | ICD-10-CM | POA: Diagnosis not present

## 2022-05-12 DIAGNOSIS — E559 Vitamin D deficiency, unspecified: Secondary | ICD-10-CM | POA: Diagnosis not present

## 2022-05-12 DIAGNOSIS — D509 Iron deficiency anemia, unspecified: Secondary | ICD-10-CM | POA: Diagnosis not present

## 2022-05-13 DIAGNOSIS — Z961 Presence of intraocular lens: Secondary | ICD-10-CM | POA: Diagnosis not present

## 2022-05-27 ENCOUNTER — Encounter: Payer: Self-pay | Admitting: *Deleted

## 2022-06-01 DIAGNOSIS — Z961 Presence of intraocular lens: Secondary | ICD-10-CM | POA: Diagnosis not present

## 2022-11-13 ENCOUNTER — Encounter (HOSPITAL_COMMUNITY): Payer: Self-pay

## 2022-11-13 ENCOUNTER — Other Ambulatory Visit: Payer: Self-pay

## 2022-11-13 ENCOUNTER — Emergency Department (HOSPITAL_COMMUNITY)
Admission: EM | Admit: 2022-11-13 | Discharge: 2022-11-13 | Disposition: A | Discharge status: Home / Self Care | Payer: Medicare Other | Attending: Emergency Medicine | Admitting: Emergency Medicine

## 2022-11-13 ENCOUNTER — Emergency Department (HOSPITAL_COMMUNITY): Payer: Medicare Other

## 2022-11-13 DIAGNOSIS — K5730 Diverticulosis of large intestine without perforation or abscess without bleeding: Secondary | ICD-10-CM | POA: Diagnosis not present

## 2022-11-13 DIAGNOSIS — N200 Calculus of kidney: Secondary | ICD-10-CM | POA: Diagnosis not present

## 2022-11-13 DIAGNOSIS — Z7901 Long term (current) use of anticoagulants: Secondary | ICD-10-CM | POA: Insufficient documentation

## 2022-11-13 DIAGNOSIS — R109 Unspecified abdominal pain: Secondary | ICD-10-CM | POA: Diagnosis present

## 2022-11-13 DIAGNOSIS — M545 Low back pain, unspecified: Secondary | ICD-10-CM | POA: Diagnosis not present

## 2022-11-13 DIAGNOSIS — K449 Diaphragmatic hernia without obstruction or gangrene: Secondary | ICD-10-CM | POA: Diagnosis not present

## 2022-11-13 DIAGNOSIS — Z743 Need for continuous supervision: Secondary | ICD-10-CM | POA: Diagnosis not present

## 2022-11-13 DIAGNOSIS — N132 Hydronephrosis with renal and ureteral calculous obstruction: Secondary | ICD-10-CM | POA: Diagnosis not present

## 2022-11-13 DIAGNOSIS — M549 Dorsalgia, unspecified: Secondary | ICD-10-CM | POA: Diagnosis not present

## 2022-11-13 LAB — CBC WITH DIFFERENTIAL/PLATELET
Abs Immature Granulocytes: 0.02 10*3/uL (ref 0.00–0.07)
Basophils Absolute: 0 10*3/uL (ref 0.0–0.1)
Basophils Relative: 1 %
Eosinophils Absolute: 0.1 10*3/uL (ref 0.0–0.5)
Eosinophils Relative: 2 %
HCT: 44.6 % (ref 39.0–52.0)
Hemoglobin: 15 g/dL (ref 13.0–17.0)
Immature Granulocytes: 0 %
Lymphocytes Relative: 29 %
Lymphs Abs: 1.9 10*3/uL (ref 0.7–4.0)
MCH: 30.9 pg (ref 26.0–34.0)
MCHC: 33.6 g/dL (ref 30.0–36.0)
MCV: 92 fL (ref 80.0–100.0)
Monocytes Absolute: 0.4 10*3/uL (ref 0.1–1.0)
Monocytes Relative: 7 %
Neutro Abs: 4.1 10*3/uL (ref 1.7–7.7)
Neutrophils Relative %: 61 %
Platelets: 165 10*3/uL (ref 150–400)
RBC: 4.85 MIL/uL (ref 4.22–5.81)
RDW: 13 % (ref 11.5–15.5)
WBC: 6.6 10*3/uL (ref 4.0–10.5)
nRBC: 0 % (ref 0.0–0.2)

## 2022-11-13 LAB — URINALYSIS, ROUTINE W REFLEX MICROSCOPIC
Bilirubin Urine: NEGATIVE
Glucose, UA: NEGATIVE mg/dL
Ketones, ur: NEGATIVE mg/dL
Leukocytes,Ua: NEGATIVE
Nitrite: NEGATIVE
Protein, ur: 100 mg/dL — AB
RBC / HPF: >50 RBC/hpf (ref 0–5)
Specific Gravity, Urine: 1.024 (ref 1.005–1.030)
pH: 5 (ref 5.0–8.0)

## 2022-11-13 LAB — BASIC METABOLIC PANEL
Anion gap: 8 (ref 5–15)
BUN: 15 mg/dL (ref 8–23)
CO2: 23 mmol/L (ref 22–32)
Calcium: 8.7 mg/dL — ABNORMAL LOW (ref 8.9–10.3)
Chloride: 107 mmol/L (ref 98–111)
Creatinine, Ser: 0.85 mg/dL (ref 0.61–1.24)
GFR, Estimated: >60 mL/min (ref >60)
Glucose, Bld: 151 mg/dL — ABNORMAL HIGH (ref 70–99)
Potassium: 4.1 mmol/L (ref 3.5–5.1)
Sodium: 138 mmol/L (ref 135–145)

## 2022-11-13 MED ORDER — HYDROMORPHONE HCL 1 MG/ML IJ SOLN
1.0000 mg | Freq: Once | INTRAMUSCULAR | Status: AC
  Administered 2022-11-13: 1 mg via INTRAVENOUS
  Filled 2022-11-13: qty 1

## 2022-11-13 MED ORDER — OXYCODONE-ACETAMINOPHEN 5-325 MG PO TABS: 1.0000 | ORAL_TABLET | Freq: Four times a day (QID) | ORAL | 0 refills | Status: DC | PRN

## 2022-11-13 MED ORDER — TAMSULOSIN HCL 0.4 MG PO CAPS: 0.4000 mg | ORAL_CAPSULE | Freq: Every day | ORAL | 0 refills | Status: DC

## 2022-11-13 MED ORDER — ONDANSETRON HCL 4 MG/2ML IJ SOLN
4.0000 mg | Freq: Once | INTRAMUSCULAR | Status: AC
  Administered 2022-11-13: 4 mg via INTRAVENOUS
  Filled 2022-11-13: qty 2

## 2022-11-13 MED ORDER — TAMSULOSIN HCL 0.4 MG PO CAPS
0.4000 mg | ORAL_CAPSULE | Freq: Once | ORAL | Status: AC
  Administered 2022-11-13: 0.4 mg via ORAL
  Filled 2022-11-13: qty 1

## 2022-11-13 NOTE — ED Provider Notes (Cosign Needed Addendum)
[facility] Provider Note   CSN: [id number] Arrival date & time: 11/13/22  1611     History  Chief Complaint  Patient presents with   Flank Pain    Stephen Rowe is a 82 y.o. male.  The history is provided by the patient and the spouse.  Flank Pain This is a recurrent problem. The current episode started 1 to 2 hours ago. The problem occurs constantly. The problem has been resolved. Pertinent negatives include no chest pain, no abdominal pain, no headaches and no shortness of breath. Nothing aggravates the symptoms. Relieved by: took 2 tylenol and wife applied biofreeze. Treatments tried: symptoms currently resolved, but pt states sx similar to kidney stone pain,  does not believe it is MSK back pain. The treatment provided significant relief.   Patient presenting with sudden onset left flank pain less than 2 hours prior to arrival.  He has noticed darker than normal urine over the past week, no gross hematuria or dysuria.  He does have a distant history of kidney stone passage approximately 10 years ago, today symptoms remind him of that incident.  He denies nausea or vomiting, no fevers.  No diarrhea.  No abdominal pain or distention.     Home Medications Prior to Admission medications   Medication Sig Start Date End Date Taking? Authorizing Provider  acetaminophen (TYLENOL) 500 MG tablet Take 1,000 mg by mouth every 6 (six) hours as needed for mild pain or moderate pain.   Yes [provider]  apixaban (ELIQUIS) 5 MG TABS tablet Take 5 mg by mouth 2 (two) times daily.   Yes [provider]  Menthol, Topical Analgesic, (BIOFREEZE) 10 % CREA Apply 1 Application topically daily as needed (applied to left hip for pain).   Yes [provider]  metFORMIN (GLUCOPHAGE-XR) 500 MG 24 hr tablet Take 500 mg by mouth daily with breakfast.   Yes [provider]  metoprolol succinate (TOPROL-XL) 25 MG 24 hr tablet  Take 25 mg by mouth daily. 09/01/20  Yes [provider]  oxyCODONE-acetaminophen (PERCOCET/ROXICET) 5-325 MG tablet Take 1 tablet by mouth every 6 (six) hours as needed for severe pain. 11/13/22  Yes Thirza Pellicano, Raynelle Fanning, PA-C  rosuvastatin (CRESTOR) 5 MG tablet Take 5 mg by mouth daily.   Yes [provider]  tamsulosin (FLOMAX) 0.4 MG CAPS capsule Take 1 capsule (0.4 mg total) by mouth daily after supper. 11/13/22  Yes IdolRaynelle Fanning, PA-C      Allergies    Patient has no known allergies.    Review of Systems   Review of Systems  Constitutional:  Negative for fever.  HENT:  Negative for congestion.   Eyes: Negative.   Respiratory:  Negative for chest tightness and shortness of breath.   Cardiovascular:  Negative for chest pain.  Gastrointestinal:  Negative for abdominal pain, nausea and vomiting.  Genitourinary:  Positive for flank pain. Negative for dysuria.  Musculoskeletal:  Negative for arthralgias, joint swelling and neck pain.  Skin: Negative.  Negative for rash and wound.  Neurological:  Negative for dizziness, weakness, light-headedness, numbness and headaches.  Psychiatric/Behavioral: Negative.      Physical Exam Updated Vital Signs BP 131/76 (BP Location: Right Arm)   Pulse 71   Temp 98 F (36.7 C)   Resp 18   Ht 5\' 11"  (1.803 m)   Wt 95.3 kg   SpO2 97%   BMI 29.29 kg/m  Physical Exam Vitals and nursing note  reviewed.  Constitutional:      Appearance: He is well-developed.  HENT:     Head: Normocephalic and atraumatic.  Eyes:     Conjunctiva/sclera: Conjunctivae normal.  Cardiovascular:     Rate and Rhythm: Normal rate and regular rhythm.     Heart sounds: Normal heart sounds.  Pulmonary:     Effort: Pulmonary effort is normal.     Breath sounds: Normal breath sounds. No wheezing.  Abdominal:     General: Bowel sounds are normal.     Palpations: Abdomen is soft.     Tenderness: There is no abdominal tenderness. There is left CVA tenderness. There  is no guarding.  Musculoskeletal:        General: Normal range of motion.     Cervical back: Normal range of motion.  Skin:    General: Skin is warm and dry.  Neurological:     Mental Status: He is alert.     ED Results / Procedures / Treatments   Labs (all labs ordered are listed, but only abnormal results are displayed) Labs Reviewed  URINALYSIS, ROUTINE W REFLEX MICROSCOPIC - Abnormal; Notable for the following components:      Result Value   Color, Urine AMBER (*)    APPearance CLOUDY (*)    Hgb urine dipstick LARGE (*)    Protein, ur 100 (*)    Bacteria, UA RARE (*)    All other components within normal limits  BASIC METABOLIC PANEL - Abnormal; Notable for the following components:   Glucose, Bld 151 (*)    Calcium 8.7 (*)    All other components within normal limits  CBC WITH DIFFERENTIAL/PLATELET    EKG None  Radiology CT Renal Stone Study  Result Date: 11/13/2022 CLINICAL DATA:  Right-sided flank pain EXAM: CT ABDOMEN AND PELVIS WITHOUT CONTRAST TECHNIQUE: Multidetector CT imaging of the abdomen and pelvis was performed following the standard protocol without IV contrast. RADIATION DOSE REDUCTION: This exam was performed according to the departmental dose-optimization program which includes automated exposure control, adjustment of the mA and/or kV according to patient size and/or use of iterative reconstruction technique. COMPARISON:  CT abdomen and pelvis 08/01/2014 FINDINGS: Lower chest: There is a new 6 mm right middle lobe nodule. There is atelectasis in the lung bases. Hepatobiliary: No focal liver abnormality is seen. Status post cholecystectomy. No biliary dilatation. Pancreas: Unremarkable. No pancreatic ductal dilatation or surrounding inflammatory changes. Spleen: Normal in size without focal abnormality. Adrenals/Urinary Tract: There is a 3 mm calculus in the mid left ureter. There is mild left-sided hydronephrosis. There is a 4 mm calculus in the lower pole  of the left kidney. The right kidney and adrenal glands are within normal limits. The bladder is decompressed. Stomach/Bowel: Stomach is within normal limits. There is a small hiatal hernia. Appendix appears normal. No evidence of bowel wall thickening, distention, or inflammatory changes. There is sigmoid and descending colon diverticulosis. Vascular/Lymphatic: Aortic atherosclerosis. No enlarged abdominal or pelvic lymph nodes. Reproductive: Prostate gland is enlarged. Other: There is no ascites. There are small fat containing bilateral inguinal hernias. Musculoskeletal: T10-S1 posterior fusion hardware present. IMPRESSION: 1. 3 mm calculus in the mid left ureter with mild obstructive uropathy. 2. Additional nonobstructing calculus in the left kidney. 3. Colonic diverticulosis. 4. Small hiatal hernia. 5. 6 mm right solid pulmonary nodule. Non-contrast chest CT at 6-12 months is recommended. If the nodule is stable at time of repeat CT, then future CT at 18-24 months (from today's scan) is  considered optional for low-risk patients, but is recommended for high-risk patients. This recommendation follows the consensus statement: Guidelines for Management of Incidental Pulmonary Nodules Detected on CT Images: From the Fleischner Society 2017; Radiology 2017; 284:228-243. 6. Prostatomegaly. Aortic Atherosclerosis (ICD10-I70.0). Electronically Signed   By: Darliss Cheney M.D.   On: 11/13/2022 18:59    Procedures Procedures    Medications Ordered in ED Medications  tamsulosin (FLOMAX) capsule 0.4 mg (has no administration in time range)  HYDROmorphone (DILAUDID) injection 1 mg (1 mg Intravenous Given 11/13/22 1803)  ondansetron (ZOFRAN) injection 4 mg (4 mg Intravenous Given 11/13/22 1802)    ED Course/ Medical Decision Making/ A&P                                 Medical Decision Making Patient presenting with sudden onset left flank pain with prior history of kidney stone, patient is fairly certain he is  passing a stone.  No fevers, no vomiting, he has had dark urination without frank blood.  No treatment prior to arrival.  Imaging and labs were reviewed, results below.  He is given a urine strainer, prescribed Flomax and oxycodone with cautions.  Referral to urology, strict return precautions were also outlined.  Amount and/or Complexity of Data Reviewed External Data Reviewed: radiology. Labs: ordered.    Details: Labs are reassuring is got a normal WBC count at 6.6, his be met is okay, he has a glucose of 151, known diabetes.  Urinalysis without UTI, he does have a large amount of hemoglobin. Radiology: ordered and independent interpretation performed.    Details: CT imaging reviewed including a 3 mm mid left ureter with mild obstruction he also has a right pulmonary nodule which will require repeat CT imaging in 6 to 12 months.  Patient was notified of this finding and asked to discuss with his PCP.   Risk Prescription drug management.           Final Clinical Impression(s) / ED Diagnoses Final diagnoses:  Kidney stone    Rx / DC Orders ED Discharge Orders          Ordered    tamsulosin (FLOMAX) 0.4 MG CAPS capsule  Daily after supper        11/13/22 1912    oxyCODONE-acetaminophen (PERCOCET/ROXICET) 5-325 MG tablet  Every 6 hours PRN        11/13/22 1912              Burgess Amor, PA-C 11/13/22 1658    Burgess Amor, PA-C 11/13/22 1919    Rondel Baton, MD 11/15/22 [phone]

## 2022-11-13 NOTE — Discharge Instructions (Addendum)
Make sure you are drinking plenty of fluids and strain your urine so you will know if and when this kidney stone passes.  Call Dr. Kathrynn Running for an office follow-up regarding this kidney stone.  You are also prescribed a medicine called Flomax which may help the stone pass easier, you have received today's dose already, do not take another dose until tomorrow evening.  You also may take the oxycodone if needed for pain relief, because cautious with this medication as it can make you drowsy, do not drive within 4 hours of taking it.  It may also cause constipation, if you are prone to this condition you would benefit from taking a stool softener while you are on this medication.  Get rechecked immediately if you develop pain that is not controlled by this medication or if you develop uncontrolled vomiting or fever.  Please also be aware that there is a nodule found in your right lung, a repeat CT scan of your chest within the next 6 to 12 months is recommended to make sure this is not changing or growing.  Please discuss this with your family doctor.

## 2022-11-13 NOTE — ED Notes (Signed)
Pt provided urine specimen cup and instructed to take back to his room when finished.

## 2022-11-13 NOTE — ED Triage Notes (Signed)
RIGHT sided flank pain started 1-1.5hours ago  Hx of kidney stones.  Denies issues with urination

## 2022-11-14 ENCOUNTER — Telehealth (HOSPITAL_COMMUNITY): Payer: Self-pay | Admitting: Student

## 2022-11-14 MED ORDER — TAMSULOSIN HCL 0.4 MG PO CAPS: 0.4000 mg | ORAL_CAPSULE | Freq: Every day | ORAL | 0 refills | Status: DC

## 2022-11-14 MED ORDER — OXYCODONE-ACETAMINOPHEN 5-325 MG PO TABS: 1.0000 | ORAL_TABLET | Freq: Four times a day (QID) | ORAL | 0 refills | Status: DC | PRN

## 2022-11-14 NOTE — Telephone Encounter (Signed)
Note created to send medications to new pharmacy

## 2022-11-17 NOTE — Progress Notes (Unsigned)
Name: Stephen Rowe DOB: 1940/10/24 MRN: [id number]  History of Present Illness: Mr. Stephen Rowe is a 82 y.o. male who presents today as a new patient at [facility]. All available relevant medical records have been reviewed.  - GU History: 1. BPH. 2. Prostate nodule. - 2009: TRUS / prostate biopsy showed ASAP, repeat biopsy showed HG PIN.  3. Kidney stones. - Had prior ureteroscopic stone manipulation in 2011.   He reports concern of kidney stone(s).  He was seen in the ER on 11/13/2022 for left flank pain. CMP with normal renal function (GFR >60; creatinine 0.85). CBC with no leukocytosis (WBC 6.6). UA showed >50 RBC/hpf; no evidence of UTI. CT showed a 3 mm mid left ureteral stone with mild left hydronephrosis. Also has a 4 mm left kidney lower pole stone.  He was discharged with prescriptions for Flomax and Percocet 5-325 mg (#15 last dispensed 11/14/2022 per PMP aware controlled substance registry).  Today: He denies stone passage; he has been straining his urine. He states that since he left the ER he hasn't had any fevers, flank pain, abdominal pain, nausea, or vomiting. Reports chronic low back pain. He reports increased urinary frequency since starting Flomax.  He denies dysuria, gross hematuria, hesitancy, straining to void, or sensations of incomplete emptying.   Fall Screening: Do you usually have a device to assist in your mobility? No   Medications: Current Outpatient Medications  Medication Sig Dispense Refill   acetaminophen (TYLENOL) 500 MG tablet Take 1,000 mg by mouth every 6 (six) hours as needed for mild pain or moderate pain.     apixaban (ELIQUIS) 5 MG TABS tablet Take 5 mg by mouth 2 (two) times daily.     Menthol, Topical Analgesic, (BIOFREEZE) 10 % CREA Apply 1 Application topically daily as needed (applied to left hip for pain).     metFORMIN (GLUCOPHAGE-XR) 500 MG 24 hr tablet Take 500 mg by mouth daily with breakfast.      metoprolol succinate (TOPROL-XL) 25 MG 24 hr tablet Take 25 mg by mouth daily.     oxyCODONE-acetaminophen (PERCOCET/ROXICET) 5-325 MG tablet Take 1 tablet by mouth every 6 (six) hours as needed for severe pain. 15 tablet 0   rosuvastatin (CRESTOR) 5 MG tablet Take 5 mg by mouth daily.     tamsulosin (FLOMAX) 0.4 MG CAPS capsule Take 1 capsule (0.4 mg total) by mouth daily after supper. 10 capsule 0   No current facility-administered medications for this visit.    Allergies: No Known Allergies  Past Medical History:  Diagnosis Date   Arthritis    Bulging disc    at L4 and L5   CAD (coronary artery disease)    3 blockages, unchanged in past 15 years, s/p cardiac cath X 2 but no stents   Diabetes mellitus without complication (HCC)    GERD (gastroesophageal reflux disease)    History of kidney stones    Hyperlipidemia    PONV (postoperative nausea and vomiting)    Prostate nodule    Prostatitis    Skin cancer of face    Past Surgical History:  Procedure Laterality Date   ANTERIOR LAT LUMBAR FUSION N/A 06/21/2014   Procedure: ANTERIOR LATERAL LUMBAR FUSION Lumbar two-three, Lumbar three-four ;  Surgeon: Donalee Citrin, MD;  Location: [facility];  Service: Neurosurgery;  Laterality: N/A;   APPLICATION OF INTRAOPERATIVE CT SCAN N/A 06/24/2014   Procedure: APPLICATION OF INTRAOPERATIVE CT SCAN;  Surgeon: Donalee Citrin, MD;  Location:  [facility];  Service: Neurosurgery;  Laterality: N/A;   CARDIAC CATHETERIZATION     X 2; last 01/17/2003 ([facility]): 50% pLAD, 20% pOM1, 15% ostial LM. NL LVF. REC: Medical Tx (Dr. Chales Abrahams)   CHOLECYSTECTOMY     COLONOSCOPY  11/18/2011   Procedure: COLONOSCOPY;  Surgeon: Corbin Ade, MD;  Location: [facility];  Service: Endoscopy;  Laterality: N/A;  8:45   HAND SURGERY     right   JOINT REPLACEMENT Right    TKA   KIDNEY STONE SURGERY     KNEE SURGERY     right   KNEE SURGERY Right    LAMINECTOMY WITH POSTERIOR LATERAL ARTHRODESIS LEVEL 2 N/A 01/14/2021    Procedure: Posterior lateral fusion - Lumbar five-Sacral one exploration fusion with removal  of hardware Lumbar Five to the ilium with removal of screws;  Surgeon: Donalee Citrin, MD;  Location: [facility];  Service: Neurosurgery;  Laterality: N/A;   ROTATOR CUFF REPAIR     right   SKIN CANCER EXCISION     left ear   Family History  Problem Relation Age of Onset   Colon cancer Neg Hx    Social History   Socioeconomic History   Marital status: Married    Spouse name: Not on file   Number of children: Not on file   Years of education: Not on file   Highest education level: Not on file  Occupational History   Occupation: retired    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  Tobacco Use   Smoking status: Former    Current packs/day: 1.00    Average packs/day: 1 pack/day for 4.0 years (4.0 ttl pk-yrs)    Types: Cigarettes   Smokeless tobacco: Never   Tobacco comments:    quit about 45 years ago  Substance and Sexual Activity   Alcohol use: No   Drug use: No   Sexual activity: Not on file  Other Topics Concern   Not on file  Social History Narrative   Not on file   Social Determinants of Health   Financial Resource Strain: Low Risk  (10/08/2021)   Overall Financial Resource Strain (CARDIA)    Difficulty of Paying Living Expenses: Not hard at all  Food Insecurity: No Food Insecurity (10/08/2021)   Hunger Vital Sign    Worried About Running Out of Food in the Last Year: Never true    Ran Out of Food in the Last Year: Never true  Transportation Needs: No Transportation Needs (10/08/2021)   PRAPARE - Administrator, Civil Service (Medical): No    Lack of Transportation (Non-Medical): No  Physical Activity: Not on file  Stress: Not on file  Social Connections: Not on file  Intimate Partner Violence: Not on file    SUBJECTIVE  Review of Systems Constitutional: Patient denies any unintentional weight loss or change in strength lntegumentary: Patient denies any  rashes or pruritus Cardiovascular: Patient denies chest pain or syncope Respiratory: Patient denies shortness of breath Gastrointestinal: Patient denies nausea, vomiting, constipation, or diarrhea Musculoskeletal: Patient denies muscle cramps or weakness Neurologic: Patient denies convulsions or seizures Allergic/Immunologic: Patient denies recent allergic reaction(s) Hematologic/Lymphatic: Patient denies bleeding tendencies Endocrine: Patient denies heat/cold intolerance  GU: As per HPI.  OBJECTIVE Vitals:   11/18/22 1135  BP: (!) 94/58  Pulse: 92  Temp: 97.9 F (36.6 C)   There is no height or weight on file to calculate BMI.  Physical Examination Constitutional: No  obvious distress; patient is non-toxic appearing  Cardiovascular: No visible lower extremity edema.  Respiratory: The patient does not have audible wheezing/stridor; respirations do not appear labored  Gastrointestinal: Abdomen non-distended Musculoskeletal: Normal ROM of UEs  Skin: No obvious rashes/open sores  Neurologic: CN 2-12 grossly intact Psychiatric: Answered questions appropriately with normal affect  Hematologic/Lymphatic/Immunologic: No obvious bruises or sites of spontaneous bleeding  UA: 6-10 WBC/hpf, >30 RBC/hpf, no bacteria  PVR: 0 ml  ASSESSMENT Left ureteral stone - Plan: Urinalysis, Routine w reflex microscopic, BLADDER SCAN AMB NON-IMAGING, DG Abd 1 View  Left flank pain - Plan: DG Abd 1 View  Hydronephrosis of left kidney - Plan: DG Abd 1 View  Kidney stone - Plan: DG Abd 1 View  We reviewed recent imaging results; based on stone characteristics and given that he is asymptomatic at this time we discussed that the stone may have already passed. Agreed to obtain KUB today for recheck.   He was advised that: - If stone present on KUB today the plan will be for him to continue Flomax 0.4 mg daily for medical expulsive therapy and follow up in 2 weeks with KUB for recheck. - If stone not  present on KUB today the plan will be to cancel 2 week f/u appt and instead f/u in 6 months with KUB for routine stone surveillance.  Advised adequate hydration and we discussed option to consider low oxalate diet given that calcium oxalate is the most common type of stone. Handout provided about stone prevention diet.  Pt verbalized understanding and agreement. All questions were answered.   PLAN Advised the following: KUB today. Continue Flomax 0.4 mg daily for medical expulsive therapy. Maintain adequate fluid intake daily. Low oxalate diet. Return in about 2 weeks (around 12/02/2022) for KUB, UA, & f/u with Evette Georges NP.  Orders Placed This Encounter  Procedures   DG Abd 1 View    Standing Status:   Future    Standing Expiration Date:   11/18/2023    Order Specific Question:   Reason for Exam (SYMPTOM  OR DIAGNOSIS REQUIRED)    Answer:   kidney stone    Order Specific Question:   Preferred imaging location?    Answer:   [facility]   Urinalysis, Routine w reflex microscopic   BLADDER SCAN AMB NON-IMAGING    It has been explained that the patient is to follow regularly with their PCP in addition to all other providers involved in their care and to follow instructions provided by these respective offices. Patient advised to contact urology clinic if any urologic-pertaining questions, concerns, new symptoms or problems arise in the interim period.  Patient Instructions  >80% of stones are calcium oxalate. This type of stones forms when body either isn't clearing oxalate well enough, is making too much oxalate, or too little citrate. This results in oxalate binding to form crystals, which continue to aggregate and form stones.  Limiting calcium does not help, but limiting oxalate in the diet can help. Increasing citric acid intake may also help.  The following measures may help to prevent the recurrence of stones: Increase water intake to 2-2.5 liters per day May add  citrus juice (lemon, lime or orange juice) to water Moderation in dairy foods Decrease in salt content 5. Low Oxalate diet: Oxylates are found in foods like Tomato, Spinach, red wine and chocolate (see additional resources below).  Internet resources for information regarding low oxalate diet: https://kidneystones.yangchunwu.com https://my.VerticalStretch.be  Foods Low in Sodium  or Oxalate Foods You Can Eat  Drinks Coffee, fruit and veggie juice (using the recommended veggies), fruit punch  Fruits Apples, apricots (fresh or canned), avocado, bananas, cherries (sweet), cranberries, grapefruit, red or green grapes, lemon and lime juice, melons, nectarines, papayas, peaches, pears, pineapples, oranges, strawberries (fresh), tangerines  Veggies Artichokes, asparagus, bamboo shoots, broccoli, brussels sprouts, cabbage, cauliflower, chayote squash, chicory, corn, cucumbers, endive, lettuce, lima beans, mushrooms, onions, peas, peppers, potatoes, radishes, rutabagas, zucchini  Breads, Cereals, Grains Egg noodles, rye bread, cooked and dry cereals without nuts or bran, crackers with unsalted tops, white or wild rice  Meat, Meat Replacements, Fish, Recruitment consultant, fish, poultry, eggs, egg whites, egg replacements  Soup Homemade soup (using the recommended veggies and meat), low-sodium bouillon, low-sodium canned  Desserts Cookies, cakes, ice cream, pudding without chocolate or nuts, candy without chocolate or nuts  Fats and Oils Butter, margarine, cream, oil, salad dressing, mayo  Other Foods Unsalted potato chips or pretzels, herbs (like garlic, garlic powder, onion powder), lemon juice, salt-free seasoning blends, vinegar  Other Foods Low in Oxalate Foods You Can Eat  Drinks Beer, cola, wine, buttermilk, lemonade or limeade (without added vitamin C), milk  Meat, Meat Replacements, Fish, [employer] meat, ham,  bacon, hot dogs, bratwurst, sausage, chicken nuggets, cheddar cheese, canned fish and shellfish  Soup Tomato soup, cheese soup  Other Foods Coconuts, lemon or lime juices, sugar or sweeteners, jellies or jams (from the recommended list)   Moderate-Oxalate Foods Foods to Limit   Drinks Fruit and veggie juices (from the list below), chocolate milk, rice milk, hot cocoa, tea   Fruits Blackberries, blueberries, black currants, cherries (sour), fruit cocktail, mangoes, orange peel, prunes, purple plums   Veggies Baked beans, carrots, celery, green beans, parsnips, summer squash, tomatoes, turnips   Breads, Cereals, Grains White bread, cornbread or cornmeal, white English muffins, saltine or soda crackers, brown rice, vanilla wafers, spaghetti and other noodles, firm tofu, bagels, oatmeal   Meat/meat replacements, fish, poultry Sardines   Desserts Chocolate cake   Fats and Oils Macadamia nuts, pistachio nuts, English walnuts   Other Foods Jams or jellies (made with the fruits above), pepper    High-Oxalate Foods Foods to Avoid Drinks Chocolate drink mixes, soy milk, Ovaltine, instant iced tea, fruit juices of fruits listed below Fruits Apricots (dried), red currants, figs, kiwi, plums, rhubarb Veggies Beans (wax, dried), beets and beet greens, chives, collard greens, eggplant, escarole, dark greens of all kinds, leeks, okra, parsley, rutabagas, spinach, Swiss chard, tomato paste, watercress Breads, Cereals, Grains Amaranth, barley, white corn flour, fried potatoes, fruitcake, grits, soybean products, sweet potatoes, wheat germ and bran, buckwheat flour, All Bran cereal, graham crackers, pretzels, whole wheat bread Meat/meat replacements, fish, poultry  Dried beans, peanut butter, soy burgers, miso  Desserts Carob, chocolate, marmalades Fats and Oils Nuts (peanuts, almonds, pecans, cashews, hazelnuts), nut butters, sesame seeds, tahini paste Other Foods Poppy seeds   Electronically signed  by: Donnita Falls, MSN, FNP-C, CUNP 11/18/2022 12:01 PM

## 2022-11-18 ENCOUNTER — Ambulatory Visit: Payer: Medicare Other | Admitting: Urology

## 2022-11-18 ENCOUNTER — Ambulatory Visit (HOSPITAL_COMMUNITY)
Admission: RE | Admit: 2022-11-18 | Discharge: 2022-11-18 | Disposition: A | Discharge status: Home / Self Care | Payer: Medicare Other | Source: Ambulatory Visit | Attending: Urology | Admitting: Urology

## 2022-11-18 ENCOUNTER — Encounter: Payer: Self-pay | Admitting: Urology

## 2022-11-18 VITALS — BP 94/58 | HR 92 | Temp 97.9°F

## 2022-11-18 DIAGNOSIS — R109 Unspecified abdominal pain: Secondary | ICD-10-CM | POA: Insufficient documentation

## 2022-11-18 DIAGNOSIS — N200 Calculus of kidney: Secondary | ICD-10-CM | POA: Insufficient documentation

## 2022-11-18 DIAGNOSIS — N201 Calculus of ureter: Secondary | ICD-10-CM

## 2022-11-18 DIAGNOSIS — N202 Calculus of kidney with calculus of ureter: Secondary | ICD-10-CM | POA: Diagnosis not present

## 2022-11-18 DIAGNOSIS — N1330 Unspecified hydronephrosis: Secondary | ICD-10-CM | POA: Insufficient documentation

## 2022-11-18 DIAGNOSIS — I878 Other specified disorders of veins: Secondary | ICD-10-CM | POA: Diagnosis not present

## 2022-11-18 LAB — MICROSCOPIC EXAMINATION
Bacteria, UA: NONE SEEN
RBC, Urine: >30 /[HPF] — AB (ref 0–2)

## 2022-11-18 LAB — URINALYSIS, ROUTINE W REFLEX MICROSCOPIC
Bilirubin, UA: NEGATIVE
Glucose, UA: NEGATIVE
Nitrite, UA: NEGATIVE
Protein,UA: NEGATIVE
Specific Gravity, UA: 1.02 (ref 1.005–1.030)
Urobilinogen, Ur: 1 mg/dL (ref 0.2–1.0)
pH, UA: 6 (ref 5.0–7.5)

## 2022-11-18 LAB — BLADDER SCAN AMB NON-IMAGING: Scan Result: 0

## 2022-11-18 NOTE — Patient Instructions (Signed)

## 2022-11-18 NOTE — Progress Notes (Signed)
post void residual=0 ?

## 2022-11-22 ENCOUNTER — Telehealth: Payer: Self-pay

## 2022-11-22 NOTE — Telephone Encounter (Signed)
Patient scheduled for 6 month appt. Letter mailed out informing pt.

## 2022-11-22 NOTE — Telephone Encounter (Signed)
Patient is asking if you can review his recent xray and let him know if the stone has moved and what the next step is.

## 2022-12-02 ENCOUNTER — Ambulatory Visit: Payer: Medicare Other | Admitting: Urology

## 2022-12-03 DIAGNOSIS — L500 Allergic urticaria: Secondary | ICD-10-CM | POA: Diagnosis not present

## 2022-12-03 DIAGNOSIS — L0390 Cellulitis, unspecified: Secondary | ICD-10-CM | POA: Diagnosis not present

## 2022-12-03 DIAGNOSIS — T148XXA Other injury of unspecified body region, initial encounter: Secondary | ICD-10-CM | POA: Diagnosis not present

## 2022-12-03 DIAGNOSIS — R21 Rash and other nonspecific skin eruption: Secondary | ICD-10-CM | POA: Diagnosis not present

## 2023-02-23 DIAGNOSIS — I483 Typical atrial flutter: Secondary | ICD-10-CM | POA: Diagnosis not present

## 2023-02-23 DIAGNOSIS — E789 Disorder of lipoprotein metabolism, unspecified: Secondary | ICD-10-CM | POA: Diagnosis not present

## 2023-02-23 DIAGNOSIS — I2510 Atherosclerotic heart disease of native coronary artery without angina pectoris: Secondary | ICD-10-CM | POA: Diagnosis not present

## 2023-05-30 ENCOUNTER — Ambulatory Visit: Payer: Medicare Other | Admitting: Urology

## 2023-06-02 ENCOUNTER — Ambulatory Visit: Payer: Medicare Other | Admitting: Urology

## 2023-06-08 ENCOUNTER — Other Ambulatory Visit: Payer: Self-pay | Admitting: Urology

## 2023-06-08 DIAGNOSIS — N200 Calculus of kidney: Secondary | ICD-10-CM

## 2023-06-08 NOTE — Progress Notes (Deleted)
 Name: Stephen Rowe DOB: [birth year] MRN: [id number]  History of Present Illness: Stephen Rowe is a 83 y.o. male who presents today for follow up visit at [facility] [place]. ***He is accompanied by ***. GU History includes: 1. BPH. 2. Prostate nodule. - 2009: TRUS / prostate biopsy showed ASAP, repeat biopsy showed HG PIN.  3. Kidney stones. - Had prior ureteroscopic stone manipulation in 2011.   At initial visit on 11/18/2022: - Seen for ER follow up; seen there for left flank pain on 11/13/2022. CT showed a 3 mm mid left ureteral stone with mild left hydronephrosis. Also has a 4 mm left kidney lower pole stone. - KUB on 11/22/2022 showed apparent interval passage of the left ureteral stone.  - Advised f/u in 6 months with KUB for routine stone surveillance.   Today: KUB today: Awaiting radiology read; *** appreciated per provider interpretation.  He {Actions; denies-reports:120008} recent stone passage. He {Actions; denies-reports:120008} flank pain or abdominal pain. He {Actions; denies-reports:120008} fevers, nausea, or vomiting.  He {Actions; denies-reports:120008} increased urinary urgency, frequency, nocturia, dysuria, gross hematuria, hesitancy, straining to void, or sensations of incomplete emptying.   Medications: Current Outpatient Medications  Medication Sig Dispense Refill   acetaminophen  (TYLENOL ) 500 MG tablet Take 1,000 mg by mouth every 6 (six) hours as needed for mild pain or moderate pain.     apixaban (ELIQUIS) 5 MG TABS tablet Take 5 mg by mouth 2 (two) times daily.     Menthol , Topical Analgesic, (BIOFREEZE) 10 % CREA Apply 1 Application topically daily as needed (applied to left hip for pain).     metFORMIN  (GLUCOPHAGE -XR) 500 MG 24 hr tablet Take 500 mg by mouth daily with breakfast.     metoprolol  succinate (TOPROL -XL) 25 MG 24 hr tablet Take 25 mg by mouth daily.     oxyCODONE -acetaminophen  (PERCOCET/ROXICET) 5-325 MG tablet Take 1 tablet  by mouth every 6 (six) hours as needed for severe pain. 15 tablet 0   rosuvastatin  (CRESTOR ) 5 MG tablet Take 5 mg by mouth daily.     tamsulosin  (FLOMAX ) 0.4 MG CAPS capsule Take 1 capsule (0.4 mg total) by mouth daily after supper. 10 capsule 0   No current facility-administered medications for this visit.    Allergies: No Known Allergies  Past Medical History:  Diagnosis Date   Arthritis    Bulging disc    at L4 and L5   CAD (coronary artery disease)    3 blockages, unchanged in past 15 years, s/p cardiac cath X 2 but no stents   Diabetes mellitus without complication (HCC)    GERD (gastroesophageal reflux disease)    History of kidney stones    Hyperlipidemia    PONV (postoperative nausea and vomiting)    Prostate nodule    Prostatitis    Skin cancer of face    Past Surgical History:  Procedure Laterality Date   ANTERIOR LAT LUMBAR FUSION N/A 06/21/2014   Procedure: ANTERIOR LATERAL LUMBAR FUSION Lumbar two-three, Lumbar three-four ;  Surgeon: Gearl Keens, MD;  Location: [facility];  Service: Neurosurgery;  Laterality: N/A;   APPLICATION OF INTRAOPERATIVE CT SCAN N/A 06/24/2014   Procedure: APPLICATION OF INTRAOPERATIVE CT SCAN;  Surgeon: Gearl Keens, MD;  Location: [facility];  Service: Neurosurgery;  Laterality: N/A;   CARDIAC CATHETERIZATION     X 2; last 01/17/2003 ([facility]): 50% pLAD, 20% pOM1, 15% ostial LM. NL LVF. REC: Medical Tx (Dr. Zackary Heron)   CHOLECYSTECTOMY  COLONOSCOPY  11/18/2011   Procedure: COLONOSCOPY;  Surgeon: Suzette Espy, MD;  [facility]: [facility];  Service: Endoscopy;  Laterality: N/A;  8:45   HAND SURGERY     right   JOINT REPLACEMENT Right    TKA   KIDNEY STONE SURGERY     KNEE SURGERY     right   KNEE SURGERY Right    LAMINECTOMY WITH POSTERIOR LATERAL ARTHRODESIS LEVEL 2 N/A 01/14/2021   Procedure: Posterior lateral fusion - Lumbar five-Sacral one exploration fusion with removal  of hardware Lumbar Five to the ilium with removal of screws;   Surgeon: Gearl Keens, MD;  Location: [facility];  Service: Neurosurgery;  Laterality: N/A;   ROTATOR CUFF REPAIR     right   SKIN CANCER EXCISION     left ear   Family History  Problem Relation Age of Onset   Colon cancer Neg Hx    Social History   Socioeconomic History   Marital status: Married    Spouse name: Not on file   Number of children: Not on file   Years of education: Not on file   Highest education level: Not on file  Occupational History   Occupation: retired    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  Tobacco Use   Smoking status: Former    Current packs/day: 1.00    Average packs/day: 1 pack/day for 4.0 years (4.0 ttl pk-yrs)    Types: Cigarettes   Smokeless tobacco: Never   Tobacco comments:    quit about 45 years ago  Substance and Sexual Activity   Alcohol use: No   Drug use: No   Sexual activity: Not on file  Other Topics Concern   Not on file  Social History Narrative   Not on file   Social Drivers of Health   Financial Resource Strain: Low Risk  (10/08/2021)   Overall Financial Resource Strain (CARDIA)    Difficulty of Paying Living Expenses: Not hard at all  Food Insecurity: No Food Insecurity (10/08/2021)   Hunger Vital Sign    Worried About Running Out of Food in the Last Year: Never true    Ran Out of Food in the Last Year: Never true  Transportation Needs: No Transportation Needs (10/08/2021)   PRAPARE - Administrator, Civil Service (Medical): No    Lack of Transportation (Non-Medical): No  Physical Activity: Not on file  Stress: Not on file  Social Connections: Not on file  Intimate Partner Violence: Not on file    SUBJECTIVE  Review of Systems Constitutional: Patient denies any unintentional weight loss or change in strength lntegumentary: Patient denies any rashes or pruritus Cardiovascular: Patient denies chest pain or syncope Respiratory: Patient denies shortness of breath Gastrointestinal: ***Patient {Actions;  denies-reports:120008} ***nausea, ***vomiting, ***constipation, ***diarrhea ***As per HPI Musculoskeletal: Patient denies muscle cramps or weakness Neurologic: Patient denies convulsions or seizures Allergic/Immunologic: Patient denies recent allergic reaction(s) Hematologic/Lymphatic: Patient denies bleeding tendencies Endocrine: Patient denies heat/cold intolerance  GU: As per HPI.  OBJECTIVE There were no vitals filed for this visit. There is no height or weight on file to calculate BMI.  Physical Examination Constitutional: No obvious distress; patient is non-toxic appearing  Cardiovascular: No visible lower extremity edema.  Respiratory: The patient does not have audible wheezing/stridor; respirations do not appear labored  Gastrointestinal: Abdomen non-distended Musculoskeletal: Normal ROM of UEs  Skin: No obvious rashes/open sores  Neurologic: CN 2-12 grossly intact Psychiatric: Answered questions appropriately  with normal affect  Hematologic/Lymphatic/Immunologic: No obvious bruises or sites of spontaneous bleeding  UA: ***negative ***positive for *** leukocytes, *** blood, ***nitrites Urine microscopy: *** WBC/hpf, *** RBC/hpf, *** bacteria ***otherwise unremarkable ***glucosuria (secondary to ***Jardiance ***Farxiga use)  PVR: *** ml  ASSESSMENT No diagnosis found.  ***We reviewed recent imaging results; ***awaiting radiology results, appears to have ***no acute findings per provider interpretation.  ***For stone prevention: Advised adequate hydration and we discussed option to consider low oxalate diet given that calcium  oxalate is the most common type of stone. Handout provided about stone prevention diet.  ***For recurrent stone formers: We discussed option to proceed with 24 hour urinalysis ([facility]) for metabolic stone evaluation, which may help with targeted recommendations for dietary I medication therapies for stone prevention. Patient elected to ***proceed/  ***hold off.  Will plan to follow up in ***6 months / ***1 year with ***KUB ***RUS for stone surveillance or sooner if needed.  Patient verbalized understanding of and agreement with current plan. All questions were answered.  PLAN Advised the following: Maintain adequate fluid intake daily. Drink citrus juice (lemon, lime or orange juice) routinely. Low oxalate diet. No follow-ups on file.  No orders of the defined types were placed in this encounter.   It has been explained that the patient is to follow regularly with their PCP in addition to all other providers involved in their care and to follow instructions provided by these respective offices. Patient advised to contact urology clinic if any urologic-pertaining questions, concerns, new symptoms or problems arise in the interim period.  There are no Patient Instructions on file for this visit.  Electronically signed by:  Lauretta Ponto, MSN, FNP-C, CUNP 06/08/2023 12:12 PM

## 2023-06-09 ENCOUNTER — Ambulatory Visit: Payer: Medicare Other | Admitting: Urology

## 2023-07-06 DIAGNOSIS — I10 Essential (primary) hypertension: Secondary | ICD-10-CM | POA: Diagnosis not present

## 2023-07-06 DIAGNOSIS — E782 Mixed hyperlipidemia: Secondary | ICD-10-CM | POA: Diagnosis not present

## 2023-07-06 DIAGNOSIS — Z0001 Encounter for general adult medical examination with abnormal findings: Secondary | ICD-10-CM | POA: Diagnosis not present

## 2023-07-06 DIAGNOSIS — Z9229 Personal history of other drug therapy: Secondary | ICD-10-CM | POA: Diagnosis not present

## 2023-07-06 DIAGNOSIS — E1159 Type 2 diabetes mellitus with other circulatory complications: Secondary | ICD-10-CM | POA: Diagnosis not present

## 2023-07-06 DIAGNOSIS — I4891 Unspecified atrial fibrillation: Secondary | ICD-10-CM | POA: Diagnosis not present

## 2023-07-06 DIAGNOSIS — I700 Atherosclerosis of aorta: Secondary | ICD-10-CM | POA: Diagnosis not present

## 2023-07-06 LAB — HEMOGLOBIN A1C: A1c: 7.6

## 2023-07-07 DIAGNOSIS — I10 Essential (primary) hypertension: Secondary | ICD-10-CM | POA: Diagnosis not present

## 2023-07-07 DIAGNOSIS — Z9229 Personal history of other drug therapy: Secondary | ICD-10-CM | POA: Diagnosis not present

## 2023-07-07 DIAGNOSIS — Z0001 Encounter for general adult medical examination with abnormal findings: Secondary | ICD-10-CM | POA: Diagnosis not present

## 2023-07-07 DIAGNOSIS — I4891 Unspecified atrial fibrillation: Secondary | ICD-10-CM | POA: Diagnosis not present

## 2023-07-07 DIAGNOSIS — E1159 Type 2 diabetes mellitus with other circulatory complications: Secondary | ICD-10-CM | POA: Diagnosis not present

## 2023-07-08 LAB — MICROALBUMIN / CREATININE URINE RATIO
Albumin, Urine POC: 11.2
Creatinine, POC: 182.7 mg/dL
Microalb Creat Ratio: 6
TSH: 2.59 (ref 0.41–5.90)

## 2023-09-13 ENCOUNTER — Ambulatory Visit: Admitting: Family Medicine

## 2023-09-13 ENCOUNTER — Encounter: Payer: Self-pay | Admitting: Family Medicine

## 2023-09-13 VITALS — BP 126/70 | HR 73 | Temp 98.1°F | Ht 71.0 in | Wt 205.0 lb

## 2023-09-13 DIAGNOSIS — I4892 Unspecified atrial flutter: Secondary | ICD-10-CM

## 2023-09-13 DIAGNOSIS — E785 Hyperlipidemia, unspecified: Secondary | ICD-10-CM

## 2023-09-13 DIAGNOSIS — Z7984 Long term (current) use of oral hypoglycemic drugs: Secondary | ICD-10-CM

## 2023-09-13 DIAGNOSIS — I2510 Atherosclerotic heart disease of native coronary artery without angina pectoris: Secondary | ICD-10-CM | POA: Diagnosis not present

## 2023-09-13 DIAGNOSIS — E1165 Type 2 diabetes mellitus with hyperglycemia: Secondary | ICD-10-CM | POA: Diagnosis not present

## 2023-09-13 MED ORDER — METFORMIN HCL ER 500 MG PO TB24: 500.0000 mg | ORAL_TABLET | Freq: Every day | ORAL | 1 refills | Status: AC

## 2023-09-13 NOTE — Patient Instructions (Signed)
Follow up in 3 months.  Take care  Dr. Cook  

## 2023-09-14 DIAGNOSIS — E1165 Type 2 diabetes mellitus with hyperglycemia: Secondary | ICD-10-CM | POA: Insufficient documentation

## 2023-09-14 DIAGNOSIS — E785 Hyperlipidemia, unspecified: Secondary | ICD-10-CM | POA: Insufficient documentation

## 2023-09-14 LAB — MICROALBUMIN / CREATININE URINE RATIO: FVC: 90

## 2023-09-14 NOTE — Assessment & Plan Note (Signed)
 Nonobstructive.  Currently asymptomatic.  Follows with cardiology.  Doing well.  Continue metoprolol .  Continue Crestor .

## 2023-09-14 NOTE — Progress Notes (Signed)
 Subjective:  Patient ID: Stephen Rowe, male    DOB: [birth year]  Age: 83 y.o. MRN: [id number]  CC:   Chief Complaint  Patient presents with   New Patient (Initial Visit)    HPI:  83 year old male presents to establish care.  Patient states that overall he is doing well.  Follows with cardiology at [facility] regarding atrial fib/flutter and coronary disease.  Has nonobstructive coronary disease.  He is currently doing well on apixaban, Crestor , and metoprolol .  Denies chest pain or shortness of breath.  Most recent LDL 73.  This was done in June.  Compliant with Crestor .  Most recent A1c 7.6.  Will discuss this today.  This is in decent control given his advanced age.  He is compliant with metformin  with no side effects.  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 11/30/2013   Kidney stone 07/27/2013   DDD (degenerative disc disease), lumbar 07/27/2013    Social Hx   Social History   Socioeconomic History   Marital status: Married    Spouse name: Not on file   Number of children: Not on file   Years of education: Not on file   Highest education level: Not on file  Occupational History   Occupation: retired    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  Tobacco Use   Smoking status: Former    Current packs/day: 1.00    Average packs/day: 1 pack/day for 4.0 years (4.0 ttl pk-yrs)    Types: Cigarettes   Smokeless tobacco: Never   Tobacco comments:    quit about 45 years ago  Substance and Sexual Activity   Alcohol use: No   Drug use: No   Sexual activity: Not on file  Other Topics Concern   Not on file  Social History Narrative   Not on file    Social Drivers of Health   Financial Resource Strain: Low Risk  (10/08/2021)   Overall Financial Resource Strain (CARDIA)    Difficulty of Paying Living Expenses: Not hard at all  Food Insecurity: No Food Insecurity (10/08/2021)   Hunger Vital Sign    Worried About Running Out of Food in the Last Year: Never true    Ran Out of Food in the Last Year: Never true  Transportation Needs: No Transportation Needs (10/08/2021)   PRAPARE - Administrator, Civil Service (Medical): No    Lack of Transportation (Non-Medical): No  Physical Activity: Not on file  Stress: Not on file  Social Connections: Not on file    Review of Systems Per HPI  Objective:  BP 126/70   Pulse 73   Temp 98.1 F (36.7 C)   Ht 5' 11 (1.803 m)   Wt 205 lb (93 kg)   SpO2 96%   BMI 28.59 kg/m      09/13/2023    1:36 PM 11/18/2022   11:35 AM 11/13/2022    7:24 PM  BP/Weight  Systolic BP 126 94 114  Diastolic BP 70 58 64  Wt. (Lbs) 205    BMI 28.59 kg/m2      Physical Exam Vitals and nursing note reviewed.  Constitutional:  General: He is not in acute distress.    Appearance: Normal appearance.  HENT:     Head: Normocephalic and atraumatic.  Eyes:     General:        Right eye: No discharge.        Left eye: No discharge.     Conjunctiva/sclera: Conjunctivae normal.  Cardiovascular:     Rate and Rhythm: Normal rate and regular rhythm.  Pulmonary:     Effort: Pulmonary effort is normal.     Breath sounds: Normal breath sounds. No wheezing, rhonchi or rales.  Neurological:     Mental Status: He is alert.  Psychiatric:        Mood and Affect: Mood normal.        Behavior: Behavior normal.     Lab Results  Component Value Date   WBC 6.6 11/13/2022   HGB 15.0 11/13/2022   HCT 44.6 11/13/2022   PLT 165 11/13/2022   GLUCOSE 151 (H) 11/13/2022   TRIG 57 06/25/2014   ALT 51 08/01/2013   AST 22 08/01/2013   NA 138 11/13/2022   K 4.1 11/13/2022   CL 107 11/13/2022    CREATININE 0.85 11/13/2022   BUN 15 11/13/2022   CO2 23 11/13/2022   HGBA1C 6.6 (H) 01/12/2021     Assessment & Plan:  Type 2 diabetes mellitus with hyperglycemia, without long-term current use of insulin  [facility]) Assessment & Plan: Fair control given age.  Most recent A1c 7.6.  Discussed being aggressive versus continue with current dosing of metformin  and lifestyle change.  Patient elected for the latter.  Metformin  refilled.  Follow-up in 3 months.  Orders: -     metFORMIN  HCl ER; Take 1 tablet (500 mg total) by mouth daily with breakfast.  Dispense: 90 tablet; Refill: 1  Coronary artery disease involving native coronary artery of native heart without angina pectoris Assessment & Plan: Nonobstructive.  Currently asymptomatic.  Follows with cardiology.  Doing well.  Continue metoprolol .  Continue Crestor .   Atrial flutter, unspecified type [facility]) Assessment & Plan: A-fib/flutter.  Follows with cardiology.  Doing well on Eliquis and metoprolol .  Continue.   Hyperlipidemia, unspecified hyperlipidemia type Assessment & Plan: Most recent LDL 73.  This is very near goal.  Continue Crestor .    Follow-up: 3 months  Roniel Halloran Bluford DO [facility]

## 2023-09-14 NOTE — Assessment & Plan Note (Signed)
 Fair control given age.  Most recent A1c 7.6.  Discussed being aggressive versus continue with current dosing of metformin  and lifestyle change.  Patient elected for the latter.  Metformin  refilled.  Follow-up in 3 months.

## 2023-09-14 NOTE — Assessment & Plan Note (Signed)
 Most recent LDL 73.  This is very near goal.  Continue Crestor .

## 2023-09-14 NOTE — Assessment & Plan Note (Signed)
 A-fib/flutter.  Follows with cardiology.  Doing well on Eliquis and metoprolol .  Continue.

## 2023-12-14 ENCOUNTER — Encounter: Payer: Self-pay | Admitting: Family Medicine

## 2023-12-14 ENCOUNTER — Other Ambulatory Visit: Payer: Self-pay | Admitting: Family Medicine

## 2023-12-14 ENCOUNTER — Ambulatory Visit: Admitting: Family Medicine

## 2023-12-14 VITALS — BP 100/67 | HR 99 | Wt 203.2 lb

## 2023-12-14 DIAGNOSIS — E1165 Type 2 diabetes mellitus with hyperglycemia: Secondary | ICD-10-CM

## 2023-12-14 DIAGNOSIS — Z7984 Long term (current) use of oral hypoglycemic drugs: Secondary | ICD-10-CM

## 2023-12-14 DIAGNOSIS — E785 Hyperlipidemia, unspecified: Secondary | ICD-10-CM | POA: Diagnosis not present

## 2023-12-14 MED ORDER — ROSUVASTATIN CALCIUM 5 MG PO TABS: 5.0000 mg | ORAL_TABLET | Freq: Every day | ORAL | 3 refills | Status: AC

## 2023-12-14 NOTE — Assessment & Plan Note (Signed)
 A1c today to assess.  Continue metformin .  Discussed being more aggressive.  Awaiting A1c results.

## 2023-12-14 NOTE — Assessment & Plan Note (Signed)
 Lipid panel today to assess.  Last LDL was 73.  Compliant with Crestor .  Continue.

## 2023-12-14 NOTE — Patient Instructions (Signed)
 Labs today.  Follow up in 6 months.  Okay to use Tylenol  1000 mg 3 times daily as needed.  Take care  Dr. Bluford

## 2023-12-14 NOTE — Progress Notes (Signed)
 Subjective:  Patient ID: Stephen Rowe, male    DOB: 08/26/1940  Age: 83 y.o. MRN: [id number]  CC:   Chief Complaint  Patient presents with   Diabetes    Three month follow up    HPI:  83 year old male presents for follow-up.  Patient reports that overall he is doing well.  He states that he has significant low back pain.  He wants to know how much Tylenol  he can take.  Blood pressure is well-controlled.  Last A1c was 7.6.  Needs A1c today.  He is on 500 mg of metformin  daily.  Will discuss today.  Needs reassessment of lipids.  He is on Crestor  5 mg daily.  Tolerating.  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 11/30/2013   Kidney stone 07/27/2013   DDD (degenerative disc disease), lumbar 07/27/2013    Social Hx   Social History   Socioeconomic History   Marital status: Married    Spouse name: Not on file   Number of children: Not on file   Years of education: Not on file   Highest education level: Not on file  Occupational History   Occupation: retired    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  Tobacco Use   Smoking status: Former    Current packs/day: 1.00    Average packs/day: 1 pack/day for 4.0 years (4.0 ttl pk-yrs)    Types: Cigarettes   Smokeless tobacco: Never   Tobacco comments:    quit about 45 years ago  Substance and Sexual Activity   Alcohol use: No   Drug use: No   Sexual activity: Not on file  Other Topics Concern   Not on file  Social History Narrative   Not on file   Social Drivers of Health   Financial Resource Strain: Low Risk  (10/08/2021)   Overall Financial Resource Strain (CARDIA)    Difficulty of Paying Living  Expenses: Not hard at all  Food Insecurity: No Food Insecurity (10/08/2021)   Hunger Vital Sign    Worried About Running Out of Food in the Last Year: Never true    Ran Out of Food in the Last Year: Never true  Transportation Needs: No Transportation Needs (10/08/2021)   PRAPARE - Administrator, Civil Service (Medical): No    Lack of Transportation (Non-Medical): No  Physical Activity: Not on file  Stress: Not on file  Social Connections: Not on file    Review of Systems  Respiratory: Negative.    Cardiovascular: Negative.    Objective:  BP 100/67   Pulse 99   Wt 203 lb 3.2 oz (92.2 kg)   BMI 28.34 kg/m      12/14/2023    9:47 AM 09/13/2023    1:36 PM 11/18/2022   11:35 AM  BP/Weight  Systolic BP 100 126 94  Diastolic BP 67 70 58  Wt. (Lbs) 203.2 205   BMI 28.34 kg/m2 28.59 kg/m2     Physical Exam Vitals and nursing note reviewed.  Constitutional:      General: He is not in acute distress.    Appearance: Normal appearance.  HENT:     Head: Normocephalic and  atraumatic.  Eyes:     General:        Right eye: No discharge.        Left eye: No discharge.     Conjunctiva/sclera: Conjunctivae normal.  Cardiovascular:     Rate and Rhythm: Normal rate and regular rhythm.  Pulmonary:     Effort: Pulmonary effort is normal.     Breath sounds: Normal breath sounds. No wheezing, rhonchi or rales.  Neurological:     Mental Status: He is alert.  Psychiatric:        Mood and Affect: Mood normal.        Behavior: Behavior normal.     Lab Results  Component Value Date   WBC 6.6 11/13/2022   HGB 15.0 11/13/2022   HCT 44.6 11/13/2022   PLT 165 11/13/2022   GLUCOSE 151 (H) 11/13/2022   TRIG 57 06/25/2014   ALT 51 08/01/2013   AST 22 08/01/2013   NA 138 11/13/2022   K 4.1 11/13/2022   CL 107 11/13/2022   CREATININE 0.85 11/13/2022   BUN 15 11/13/2022   CO2 23 11/13/2022   TSH 2.59 07/07/2023   HGBA1C 6.6 (H) 01/12/2021     Assessment & Plan:  Type  2 diabetes mellitus with hyperglycemia, without long-term current use of insulin  (HCC) Assessment & Plan: A1c today to assess.  Continue metformin .  Discussed being more aggressive.  Awaiting A1c results.  Orders: -     Hemoglobin A1c  Hyperlipidemia, unspecified hyperlipidemia type Assessment & Plan: Lipid panel today to assess.  Last LDL was 73.  Compliant with Crestor .  Continue.  Orders: -     Lipid panel  Other orders -     Rosuvastatin  Calcium ; Take 1 tablet (5 mg total) by mouth daily.  Dispense: 90 tablet; Refill: 3    Follow-up: 6 months  Ayaansh Smail Bluford DO [facility]

## 2023-12-15 ENCOUNTER — Other Ambulatory Visit: Payer: Self-pay | Admitting: Family Medicine

## 2023-12-15 LAB — LIPID PANEL
Chol/HDL Ratio: 2.7 ratio (ref 0.0–5.0)
Cholesterol, Total: 150 mg/dL (ref 100–199)
HDL: 56 mg/dL (ref >39)
LDL Chol Calc (NIH): 76 mg/dL (ref 0–99)
Triglycerides: 99 mg/dL (ref 0–149)
VLDL Cholesterol Cal: 18 mg/dL (ref 5–40)

## 2023-12-15 LAB — HEMOGLOBIN A1C
Est. average glucose Bld gHb Est-mCnc: 171 mg/dL
Hgb A1c MFr Bld: 7.6 % — ABNORMAL HIGH (ref 4.8–5.6)

## 2023-12-15 MED ORDER — BLOOD GLUCOSE MONITORING SUPPL DEVI: 0 refills | Status: AC

## 2023-12-15 MED ORDER — LANCET DEVICE MISC: 0 refills | Status: AC

## 2023-12-15 MED ORDER — LANCETS MISC: 1.0000 | 0 refills | Status: AC

## 2023-12-15 MED ORDER — BLOOD GLUCOSE TEST VI STRP: ORAL_STRIP | 3 refills | Status: AC

## 2023-12-18 ENCOUNTER — Ambulatory Visit: Payer: Self-pay | Admitting: Family Medicine

## 2024-06-12 ENCOUNTER — Ambulatory Visit: Admitting: Family Medicine

## 2024-07-13 ENCOUNTER — Ambulatory Visit
